# Patient Record
Sex: Female | Born: 1955 | Race: Black or African American | Hispanic: No | Marital: Single | Smoking: Former smoker
Health system: Southern US, Community
[De-identification: ages and names within clinical notes are randomized; demographics above are authoritative.]

## PROBLEM LIST (undated history)

## (undated) DIAGNOSIS — J45909 Unspecified asthma, uncomplicated: Secondary | ICD-10-CM

## (undated) DIAGNOSIS — K8020 Calculus of gallbladder without cholecystitis without obstruction: Secondary | ICD-10-CM

## (undated) DIAGNOSIS — E611 Iron deficiency: Secondary | ICD-10-CM

## (undated) DIAGNOSIS — G43809 Other migraine, not intractable, without status migrainosus: Secondary | ICD-10-CM

## (undated) DIAGNOSIS — J309 Allergic rhinitis, unspecified: Secondary | ICD-10-CM

## (undated) DIAGNOSIS — F329 Major depressive disorder, single episode, unspecified: Secondary | ICD-10-CM

## (undated) DIAGNOSIS — K635 Polyp of colon: Secondary | ICD-10-CM

## (undated) DIAGNOSIS — I10 Essential (primary) hypertension: Secondary | ICD-10-CM

## (undated) DIAGNOSIS — F419 Anxiety disorder, unspecified: Secondary | ICD-10-CM

## (undated) DIAGNOSIS — K219 Gastro-esophageal reflux disease without esophagitis: Secondary | ICD-10-CM

## (undated) DIAGNOSIS — E785 Hyperlipidemia, unspecified: Secondary | ICD-10-CM

## (undated) DIAGNOSIS — G4700 Insomnia, unspecified: Secondary | ICD-10-CM

## (undated) DIAGNOSIS — J449 Chronic obstructive pulmonary disease, unspecified: Secondary | ICD-10-CM

## (undated) DIAGNOSIS — D649 Anemia, unspecified: Secondary | ICD-10-CM

## (undated) DIAGNOSIS — F17200 Nicotine dependence, unspecified, uncomplicated: Secondary | ICD-10-CM

## (undated) DIAGNOSIS — J4489 Other specified chronic obstructive pulmonary disease: Secondary | ICD-10-CM

## (undated) DIAGNOSIS — E538 Deficiency of other specified B group vitamins: Secondary | ICD-10-CM

## (undated) DIAGNOSIS — E876 Hypokalemia: Secondary | ICD-10-CM

## (undated) DIAGNOSIS — E7800 Pure hypercholesterolemia, unspecified: Secondary | ICD-10-CM

## (undated) DIAGNOSIS — F3289 Other specified depressive episodes: Secondary | ICD-10-CM

## (undated) DIAGNOSIS — R0789 Other chest pain: Secondary | ICD-10-CM

## (undated) DIAGNOSIS — M129 Arthropathy, unspecified: Secondary | ICD-10-CM

## (undated) HISTORY — DX: Unspecified asthma, uncomplicated: J45.909

## (undated) HISTORY — DX: Other chest pain: R07.89

## (undated) HISTORY — DX: Essential (primary) hypertension: I10

## (undated) HISTORY — DX: Nicotine dependence, unspecified, uncomplicated: F17.200

## (undated) HISTORY — PX: KNEE ARTHROSCOPY: SUR90

## (undated) HISTORY — DX: Arthropathy, unspecified: M12.9

## (undated) HISTORY — DX: Chronic obstructive pulmonary disease, unspecified: J44.9

## (undated) HISTORY — DX: Anxiety disorder, unspecified: F41.9

## (undated) HISTORY — PX: TONSILLECTOMY: SUR1361

## (undated) HISTORY — PX: CARPAL TUNNEL RELEASE: SHX101

## (undated) HISTORY — DX: Calculus of gallbladder without cholecystitis without obstruction: K80.20

## (undated) HISTORY — DX: Gastro-esophageal reflux disease without esophagitis: K21.9

## (undated) HISTORY — DX: Deficiency of other specified B group vitamins: E53.8

## (undated) HISTORY — PX: APPENDECTOMY: SHX54

## (undated) HISTORY — DX: Other migraine, not intractable, without status migrainosus: G43.809

## (undated) HISTORY — DX: Allergic rhinitis, unspecified: J30.9

## (undated) HISTORY — DX: Anemia, unspecified: D64.9

## (undated) HISTORY — DX: Polyp of colon: K63.5

## (undated) HISTORY — DX: Other specified chronic obstructive pulmonary disease: J44.89

## (undated) HISTORY — DX: Hyperlipidemia, unspecified: E78.5

## (undated) HISTORY — DX: Morbid (severe) obesity due to excess calories: E66.01

## (undated) HISTORY — DX: Iron deficiency: E61.1

## (undated) HISTORY — DX: Other specified depressive episodes: F32.89

## (undated) HISTORY — DX: Major depressive disorder, single episode, unspecified: F32.9

## (undated) HISTORY — DX: Pure hypercholesterolemia, unspecified: E78.00

## (undated) HISTORY — DX: Insomnia, unspecified: G47.00

## (undated) HISTORY — DX: Hypokalemia: E87.6

---

## 1989-07-10 HISTORY — PX: CHOLECYSTECTOMY: SHX55

## 2001-10-11 ENCOUNTER — Inpatient Hospital Stay (HOSPITAL_COMMUNITY): Admission: EM | Admit: 2001-10-11 | Discharge: 2001-10-21 | Payer: Self-pay | Admitting: Psychiatry

## 2001-11-23 ENCOUNTER — Emergency Department (HOSPITAL_COMMUNITY): Admission: EM | Admit: 2001-11-23 | Discharge: 2001-11-23 | Payer: Self-pay | Admitting: Emergency Medicine

## 2007-10-25 IMAGING — CR DG KNEE COMPLETE 4+V*L*
4 series · 4 of 4 positions shown · non-contrast
Comparison: None

CLINICAL DATA: Left knee pain

LEFT KNEE - COMPLETE 4+ VIEW

[t knee ap left]
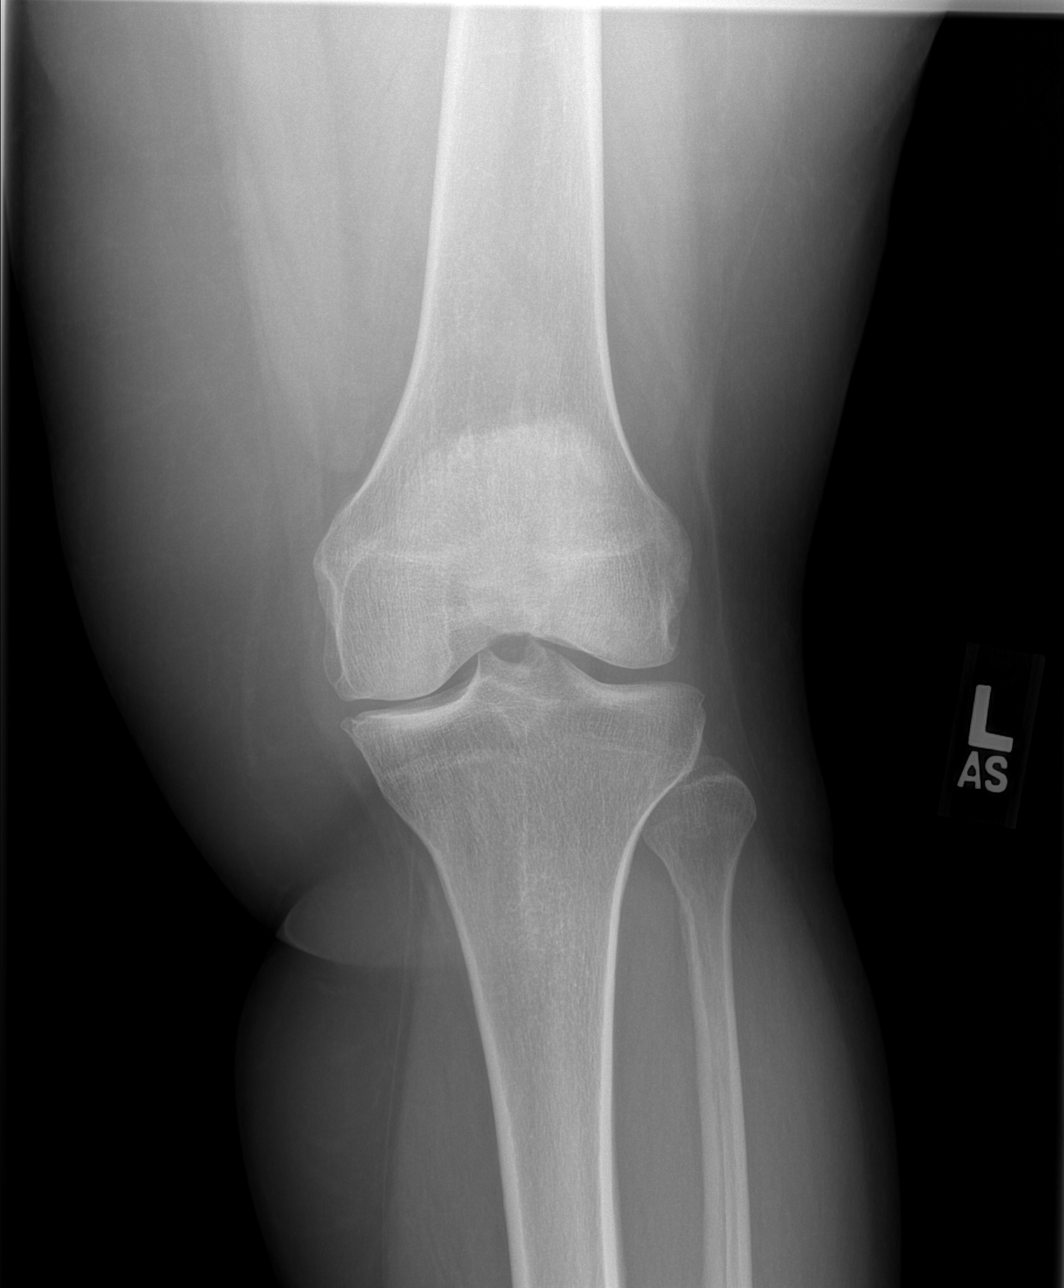

[t knee oblique left (1 of 2)]
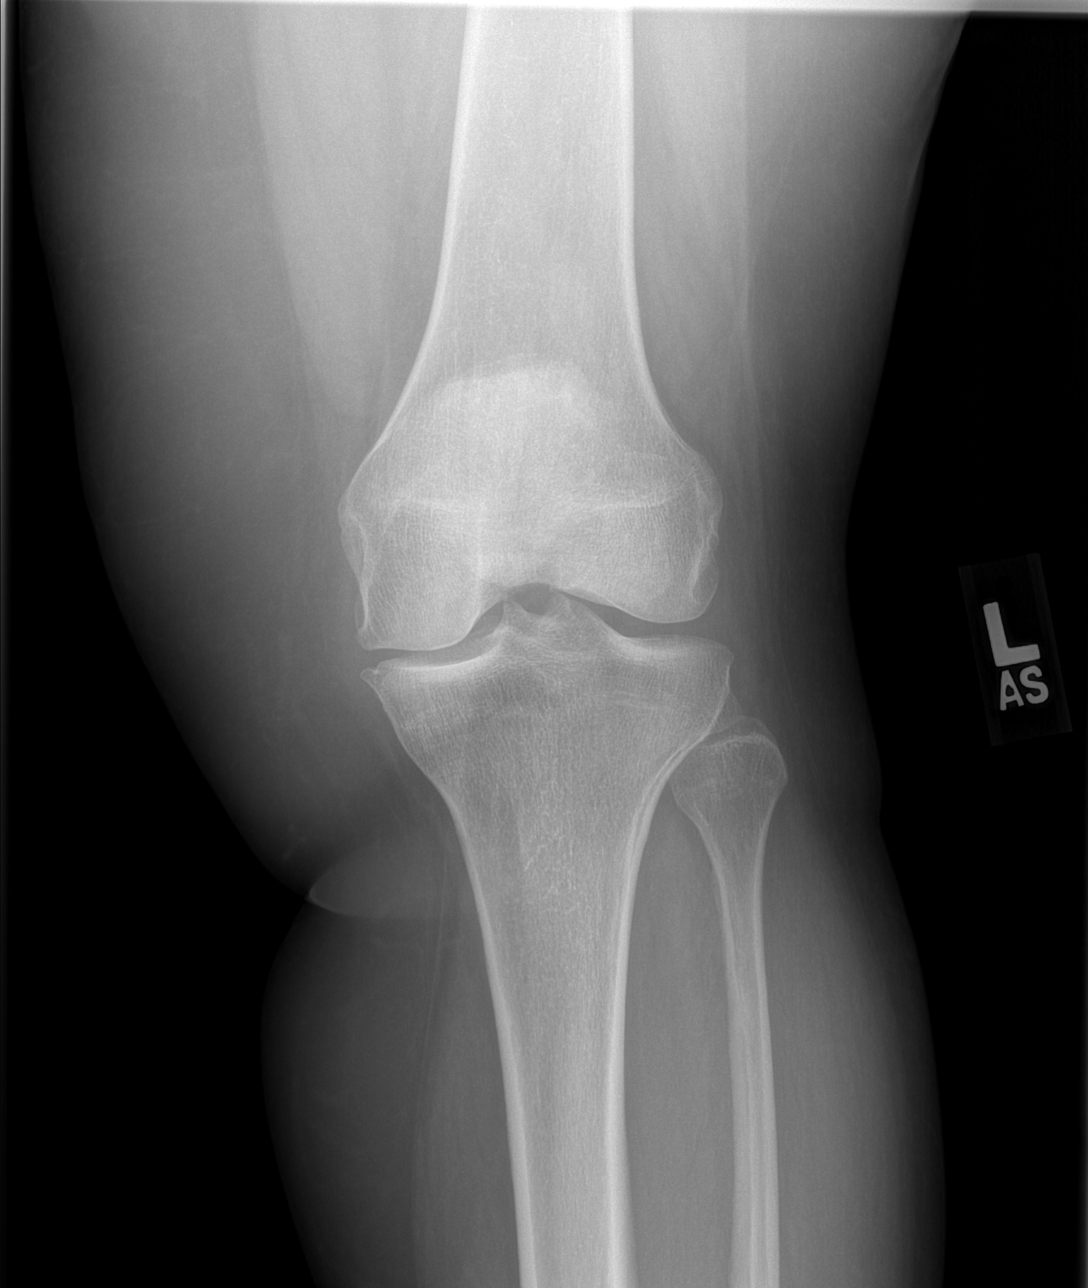

[t knee oblique left (2 of 2)]
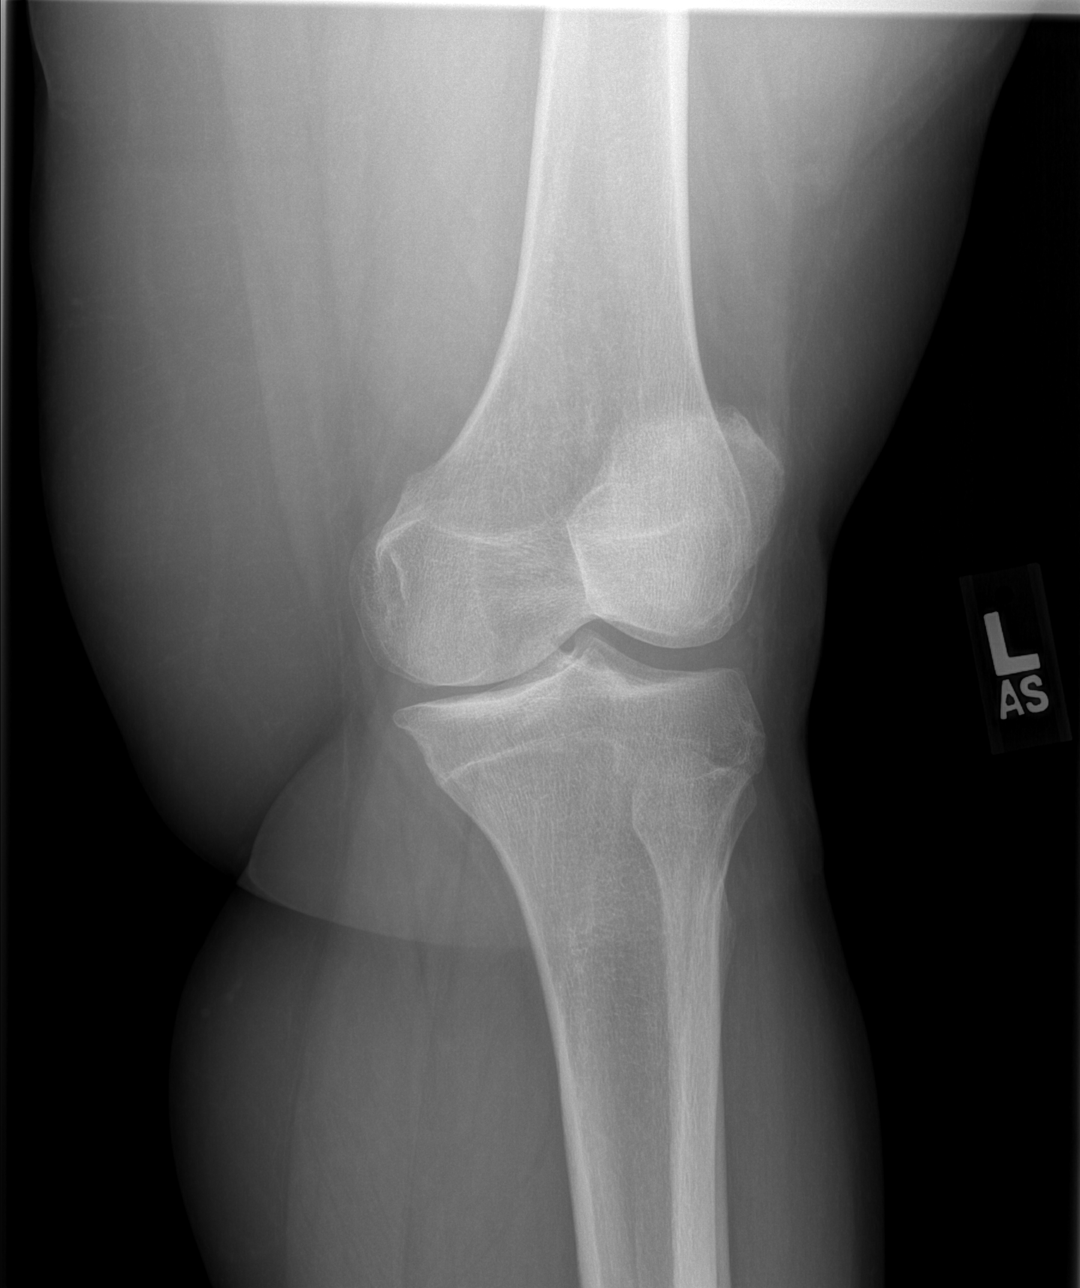

[t knee lat left]
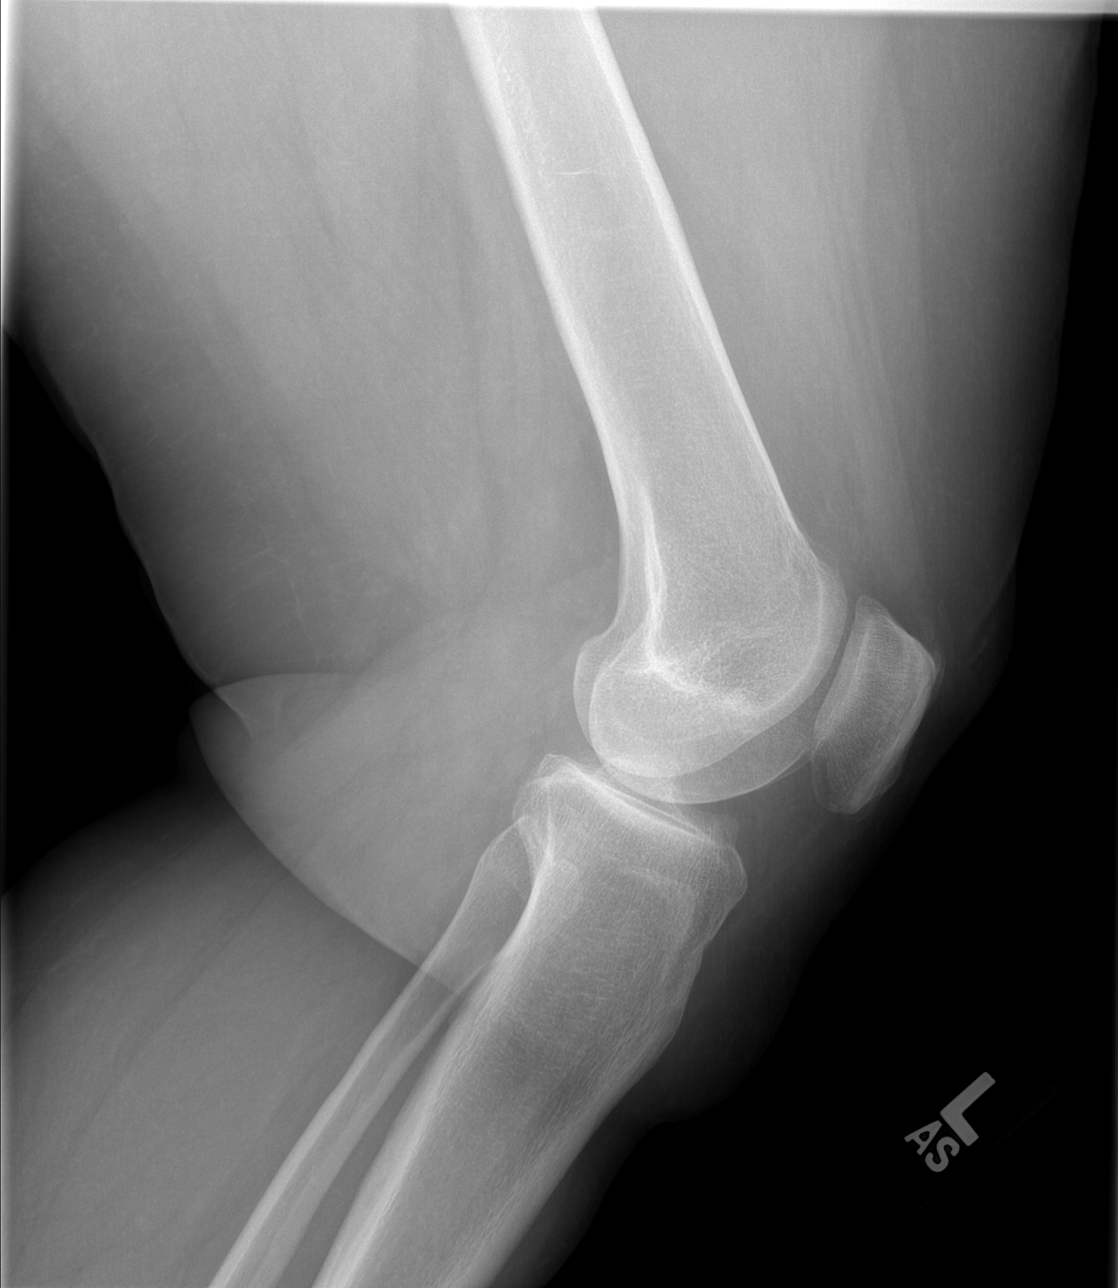

[4 of 4 positions shown; findings below may reference images not displayed]

FINDINGS: No acute bony abnormality.  Specifically, no fracture,
subluxation, or dislocation.  Soft tissues are intact. No joint
effusion.  Mild degenerative changes with joint space narrowing in
the medial compartment.
IMPRESSION: No acute bony abnormality.

Mild degenerative changes.

## 2008-04-06 ENCOUNTER — Encounter: Admission: RE | Admit: 2008-04-06 | Discharge: 2008-04-06 | Payer: Self-pay | Admitting: Family Medicine

## 2008-05-27 ENCOUNTER — Emergency Department (HOSPITAL_COMMUNITY): Admission: EM | Admit: 2008-05-27 | Discharge: 2008-05-27 | Payer: Self-pay | Admitting: Emergency Medicine

## 2008-05-27 IMAGING — CR DG CHEST 2V
2 series · 2 of 2 positions shown · non-contrast
Comparison: None

CLINICAL DATA: Cough.  Congestion.  Smoker.

CHEST - 2 VIEW

[w chest pa]
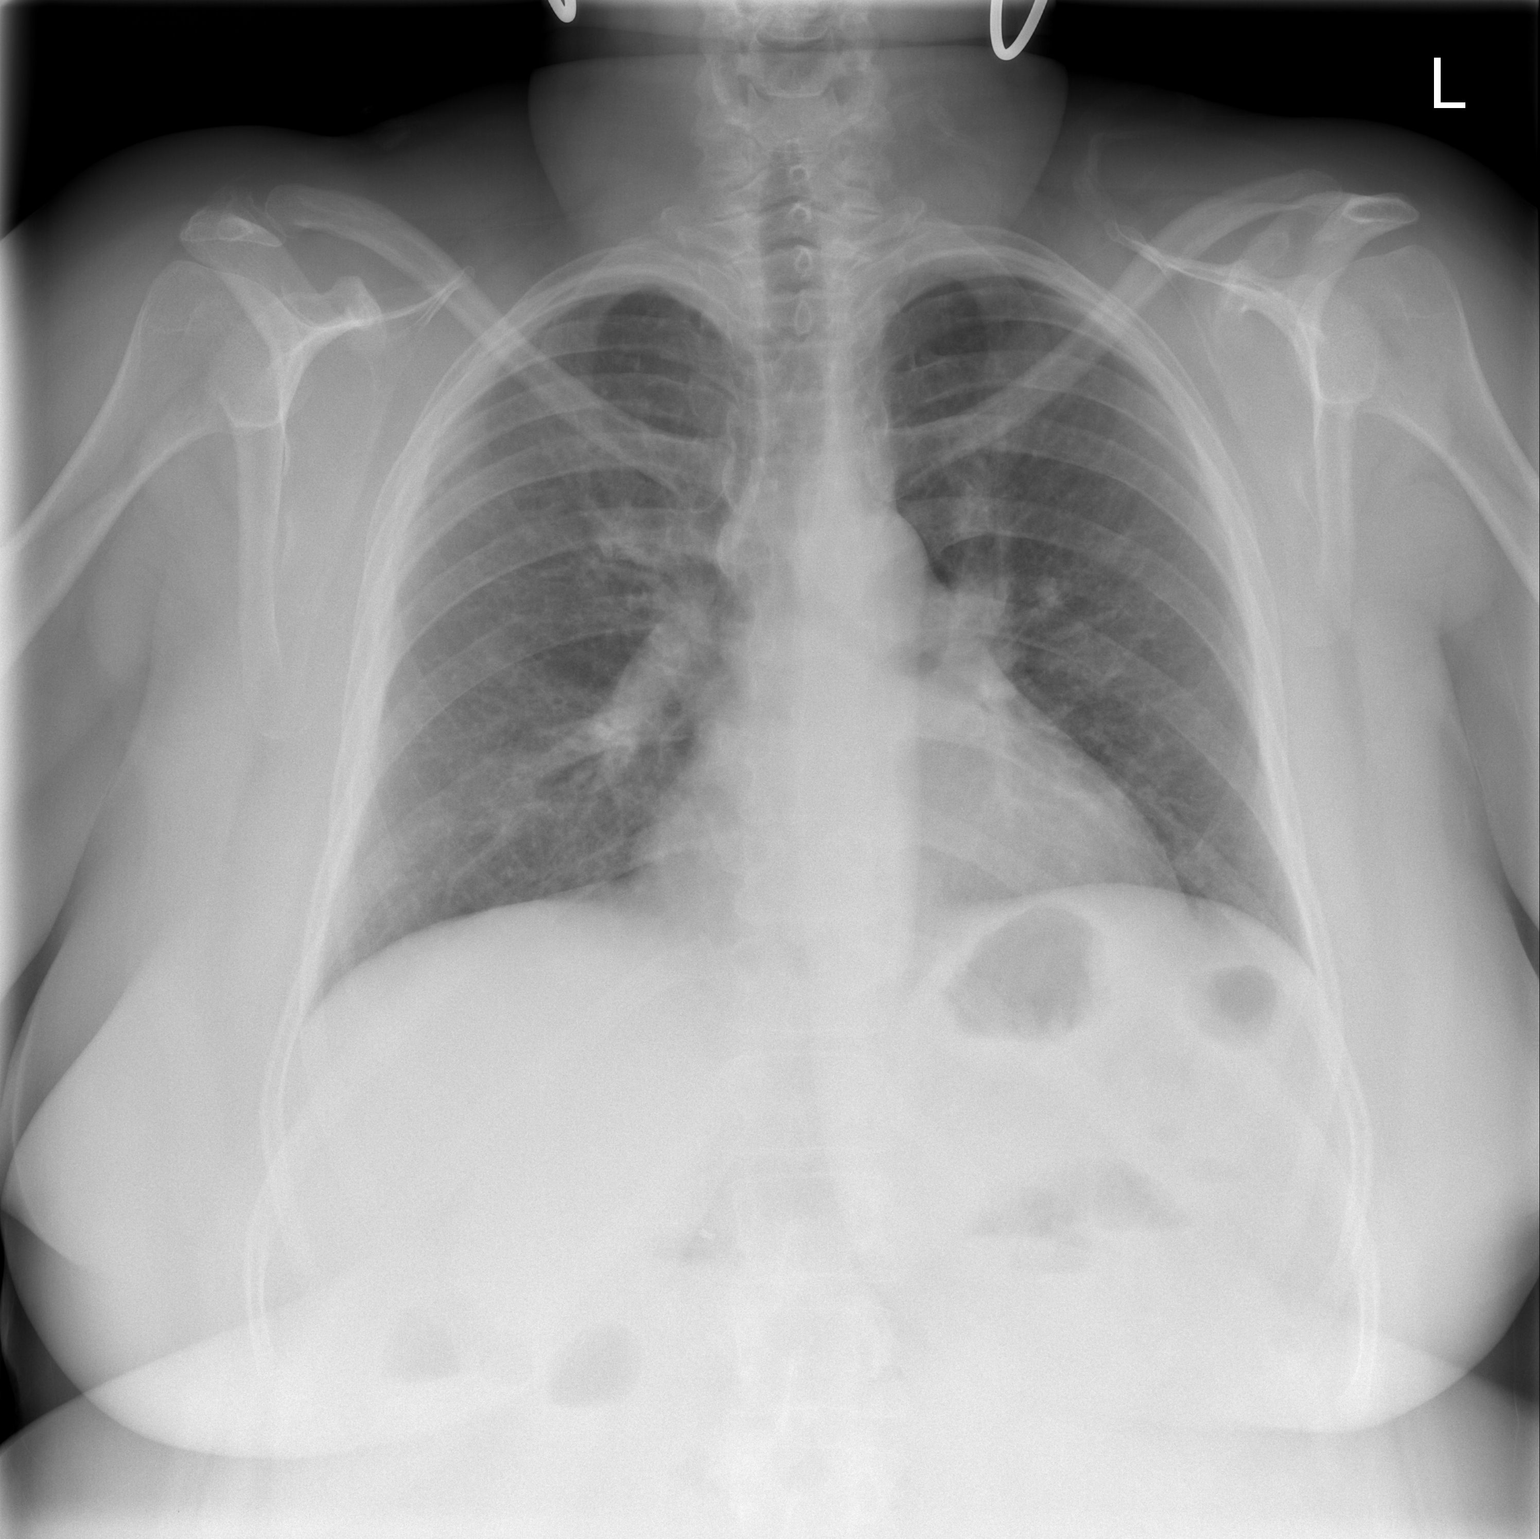

[w chest lat]
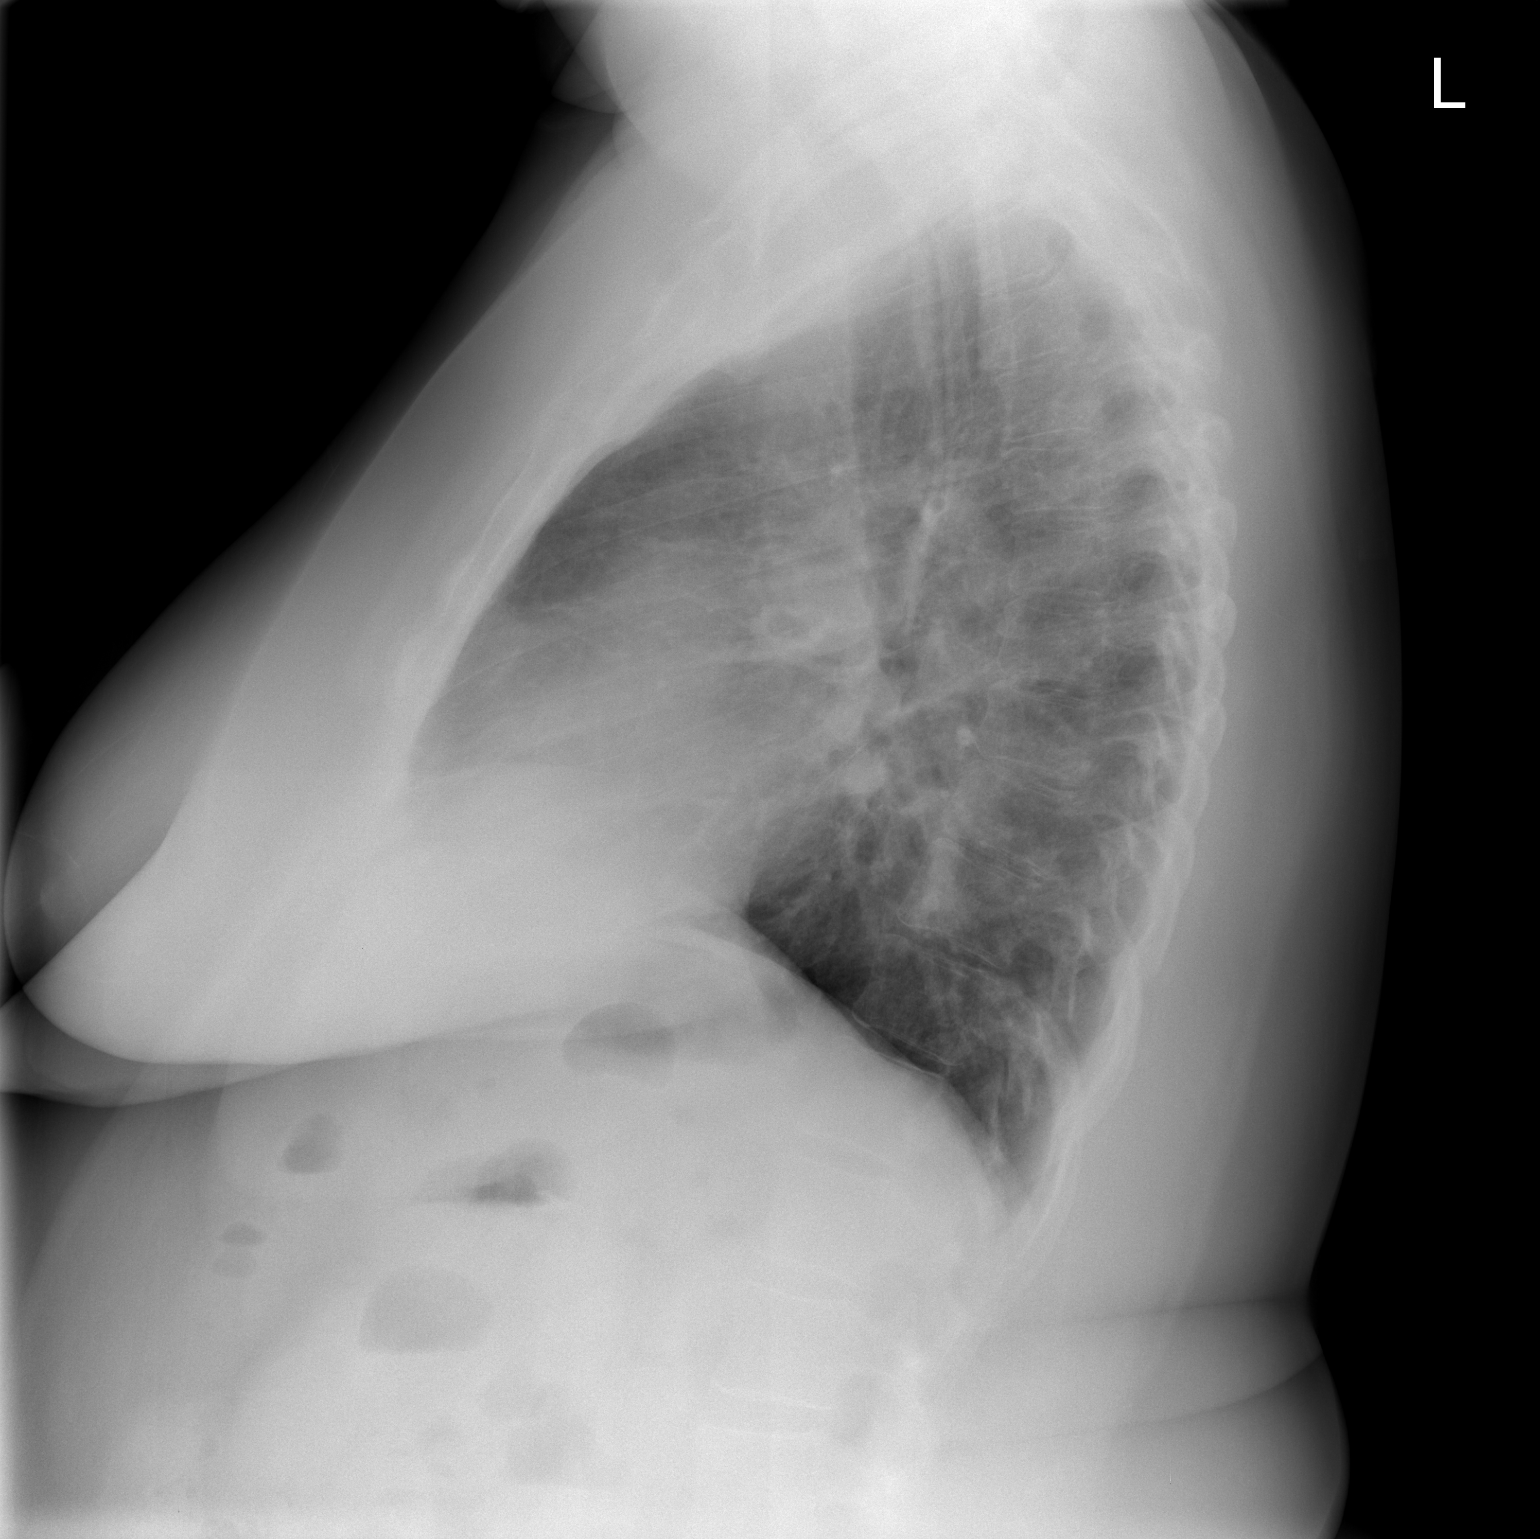

[2 of 2 positions shown; findings below may reference images not displayed]

FINDINGS: Chronic bronchitic changes.  No active infiltrate,
consolidation, or atelectasis.  Normal cardiomediastinal
silhouette.
IMPRESSION: Negative for pneumonia.  Chronic bronchitic changes

## 2008-06-01 ENCOUNTER — Encounter (INDEPENDENT_AMBULATORY_CARE_PROVIDER_SITE_OTHER): Payer: Self-pay | Admitting: *Deleted

## 2008-06-01 ENCOUNTER — Ambulatory Visit: Payer: Self-pay | Admitting: Internal Medicine

## 2008-06-01 ENCOUNTER — Telehealth: Payer: Self-pay | Admitting: Internal Medicine

## 2008-06-01 DIAGNOSIS — J449 Chronic obstructive pulmonary disease, unspecified: Secondary | ICD-10-CM

## 2008-06-01 DIAGNOSIS — K219 Gastro-esophageal reflux disease without esophagitis: Secondary | ICD-10-CM

## 2008-06-01 DIAGNOSIS — J4489 Other specified chronic obstructive pulmonary disease: Secondary | ICD-10-CM | POA: Insufficient documentation

## 2008-06-01 DIAGNOSIS — R519 Headache, unspecified: Secondary | ICD-10-CM | POA: Insufficient documentation

## 2008-06-01 DIAGNOSIS — F17200 Nicotine dependence, unspecified, uncomplicated: Secondary | ICD-10-CM

## 2008-06-01 DIAGNOSIS — E785 Hyperlipidemia, unspecified: Secondary | ICD-10-CM | POA: Insufficient documentation

## 2008-06-01 DIAGNOSIS — F329 Major depressive disorder, single episode, unspecified: Secondary | ICD-10-CM

## 2008-06-01 DIAGNOSIS — R51 Headache: Secondary | ICD-10-CM

## 2008-06-01 DIAGNOSIS — G43809 Other migraine, not intractable, without status migrainosus: Secondary | ICD-10-CM | POA: Insufficient documentation

## 2008-06-01 LAB — CONVERTED CEMR LAB
Alkaline Phosphatase: 84 units/L (ref 39–117)
BUN: 9 mg/dL (ref 6–23)
Basophils Relative: 1.3 % (ref 0.0–3.0)
Bilirubin Urine: NEGATIVE
CO2: 32 meq/L (ref 19–32)
Chloride: 102 meq/L (ref 96–112)
Cholesterol: 203 mg/dL (ref 0–200)
Eosinophils Absolute: 0 10*3/uL (ref 0.0–0.7)
GFR calc Af Amer: 98 mL/min
HDL: 76.6 mg/dL (ref >39.0)
Hemoglobin, Urine: NEGATIVE
Ketones, ur: NEGATIVE mg/dL
MCHC: 34.5 g/dL (ref 30.0–36.0)
Mucus, UA: NEGATIVE
Neutro Abs: 4.5 10*3/uL (ref 1.4–7.7)
Neutrophils Relative %: 46.9 % (ref 43.0–77.0)
Nitrite: NEGATIVE
Platelets: 263 10*3/uL (ref 150–400)
Potassium: 3.5 meq/L (ref 3.5–5.1)
RBC: 4.22 M/uL (ref 3.87–5.11)
Sodium: 141 meq/L (ref 135–145)
Total Bilirubin: 0.4 mg/dL (ref 0.3–1.2)
Total CHOL/HDL Ratio: 2.7
Urine Glucose: NEGATIVE mg/dL
Urobilinogen, UA: 0.2 (ref 0.0–1.0)
pH: 6.5 (ref 5.0–8.0)

## 2008-06-02 ENCOUNTER — Encounter: Payer: Self-pay | Admitting: Internal Medicine

## 2008-06-03 ENCOUNTER — Telehealth: Payer: Self-pay | Admitting: Internal Medicine

## 2008-06-16 ENCOUNTER — Ambulatory Visit: Payer: Self-pay | Admitting: Internal Medicine

## 2008-06-16 ENCOUNTER — Telehealth (INDEPENDENT_AMBULATORY_CARE_PROVIDER_SITE_OTHER): Payer: Self-pay | Admitting: *Deleted

## 2008-06-16 LAB — CONVERTED CEMR LAB: HDL goal, serum: 40 mg/dL

## 2008-06-17 ENCOUNTER — Telehealth: Payer: Self-pay | Admitting: Internal Medicine

## 2008-06-23 ENCOUNTER — Ambulatory Visit: Payer: Self-pay | Admitting: Gastroenterology

## 2008-06-23 DIAGNOSIS — M1710 Unilateral primary osteoarthritis, unspecified knee: Secondary | ICD-10-CM

## 2008-06-23 LAB — CONVERTED CEMR LAB: Tissue Transglutaminase Ab, IgA: 0.5 units (ref <7)

## 2008-06-24 LAB — CONVERTED CEMR LAB
Ferritin: 48 ng/mL (ref 10.0–291.0)
Iron: 59 ug/dL (ref 42–145)

## 2008-06-25 ENCOUNTER — Ambulatory Visit: Payer: Self-pay | Admitting: Internal Medicine

## 2008-06-25 ENCOUNTER — Telehealth: Payer: Self-pay | Admitting: Internal Medicine

## 2008-06-25 DIAGNOSIS — D519 Vitamin B12 deficiency anemia, unspecified: Secondary | ICD-10-CM

## 2008-06-29 ENCOUNTER — Ambulatory Visit: Payer: Self-pay | Admitting: Internal Medicine

## 2008-07-06 ENCOUNTER — Ambulatory Visit: Payer: Self-pay | Admitting: Internal Medicine

## 2008-07-08 ENCOUNTER — Encounter: Payer: Self-pay | Admitting: Internal Medicine

## 2008-08-17 ENCOUNTER — Ambulatory Visit: Payer: Self-pay | Admitting: Internal Medicine

## 2008-08-17 DIAGNOSIS — J309 Allergic rhinitis, unspecified: Secondary | ICD-10-CM | POA: Insufficient documentation

## 2008-08-25 ENCOUNTER — Telehealth: Payer: Self-pay | Admitting: Gastroenterology

## 2009-03-02 ENCOUNTER — Ambulatory Visit: Payer: Self-pay | Admitting: Internal Medicine

## 2009-03-02 LAB — CONVERTED CEMR LAB
AST: 17 units/L (ref 0–37)
Albumin: 3.6 g/dL (ref 3.5–5.2)
Alkaline Phosphatase: 96 units/L (ref 39–117)
Basophils Relative: 0.6 % (ref 0.0–3.0)
Creatinine, Ser: 0.7 mg/dL (ref 0.4–1.2)
Eosinophils Absolute: 0.1 10*3/uL (ref 0.0–0.7)
Eosinophils Relative: 1.2 % (ref 0.0–5.0)
Folate: 6.8 ng/mL
Glucose, Bld: 110 mg/dL — ABNORMAL HIGH (ref 70–99)
HCT: 38.4 % (ref 36.0–46.0)
Hemoglobin: 12.9 g/dL (ref 12.0–15.0)
Lymphocytes Relative: 33.4 % (ref 12.0–46.0)
Lymphs Abs: 2.1 10*3/uL (ref 0.7–4.0)
Monocytes Relative: 8.1 % (ref 3.0–12.0)
Pro B Natriuretic peptide (BNP): 23 pg/mL (ref 0.0–100.0)
RBC: 4.4 M/uL (ref 3.87–5.11)
TSH: 1.34 microintl units/mL (ref 0.35–5.50)
Total Bilirubin: 0.4 mg/dL (ref 0.3–1.2)
Total Protein: 7.8 g/dL (ref 6.0–8.3)

## 2009-03-03 ENCOUNTER — Encounter: Payer: Self-pay | Admitting: Internal Medicine

## 2009-03-03 DIAGNOSIS — J4530 Mild persistent asthma, uncomplicated: Secondary | ICD-10-CM

## 2009-03-16 ENCOUNTER — Telehealth (INDEPENDENT_AMBULATORY_CARE_PROVIDER_SITE_OTHER): Payer: Self-pay | Admitting: *Deleted

## 2009-03-16 ENCOUNTER — Ambulatory Visit: Payer: Self-pay | Admitting: Internal Medicine

## 2009-03-17 ENCOUNTER — Encounter: Payer: Self-pay | Admitting: Internal Medicine

## 2009-03-17 ENCOUNTER — Ambulatory Visit: Payer: Self-pay

## 2009-04-19 ENCOUNTER — Ambulatory Visit: Payer: Self-pay | Admitting: Internal Medicine

## 2009-05-11 ENCOUNTER — Ambulatory Visit: Payer: Self-pay | Admitting: Internal Medicine

## 2009-06-10 ENCOUNTER — Ambulatory Visit: Payer: Self-pay | Admitting: Internal Medicine

## 2009-06-21 ENCOUNTER — Emergency Department (HOSPITAL_COMMUNITY): Admission: EM | Admit: 2009-06-21 | Discharge: 2009-06-21 | Payer: Self-pay | Admitting: Emergency Medicine

## 2009-07-15 ENCOUNTER — Ambulatory Visit: Payer: Self-pay | Admitting: Internal Medicine

## 2009-08-16 ENCOUNTER — Ambulatory Visit: Payer: Self-pay | Admitting: Internal Medicine

## 2009-09-20 ENCOUNTER — Ambulatory Visit: Payer: Self-pay | Admitting: Internal Medicine

## 2009-09-20 DIAGNOSIS — G4700 Insomnia, unspecified: Secondary | ICD-10-CM

## 2009-10-20 ENCOUNTER — Other Ambulatory Visit: Admission: RE | Admit: 2009-10-20 | Discharge: 2009-10-20 | Payer: Self-pay | Admitting: Internal Medicine

## 2009-10-20 ENCOUNTER — Ambulatory Visit: Payer: Self-pay | Admitting: Internal Medicine

## 2009-10-20 LAB — CONVERTED CEMR LAB
AST: 17 units/L (ref 0–37)
Albumin: 3.7 g/dL (ref 3.5–5.2)
Basophils Relative: 0.5 % (ref 0.0–3.0)
Calcium: 9.3 mg/dL (ref 8.4–10.5)
Cholesterol: 244 mg/dL — ABNORMAL HIGH (ref 0–200)
Direct LDL: 154 mg/dL
Eosinophils Absolute: 0.1 10*3/uL (ref 0.0–0.7)
Eosinophils Relative: 0.8 % (ref 0.0–5.0)
GFR calc non Af Amer: 96.89 mL/min (ref >60)
Glucose, Bld: 105 mg/dL — ABNORMAL HIGH (ref 70–99)
HCT: 36.8 % (ref 36.0–46.0)
HDL: 72 mg/dL (ref >39.00)
MCHC: 33.6 g/dL (ref 30.0–36.0)
MCV: 86.9 fL (ref 78.0–100.0)
Neutrophils Relative %: 51.3 % (ref 43.0–77.0)
Platelets: 279 10*3/uL (ref 150.0–400.0)
Potassium: 4.2 meq/L (ref 3.5–5.1)
TSH: 1.84 microintl units/mL (ref 0.35–5.50)
Total CHOL/HDL Ratio: 3
WBC: 6.8 10*3/uL (ref 4.5–10.5)

## 2009-11-02 ENCOUNTER — Ambulatory Visit: Payer: Self-pay | Admitting: Internal Medicine

## 2009-11-02 IMAGING — CR DG CHEST 2V
2 series · 2 of 2 positions shown · non-contrast
Comparison: [DATE]

CLINICAL DATA: Cough for several months.  Smoker.

CHEST - 2 VIEW

[view not recorded (1 of 2)]
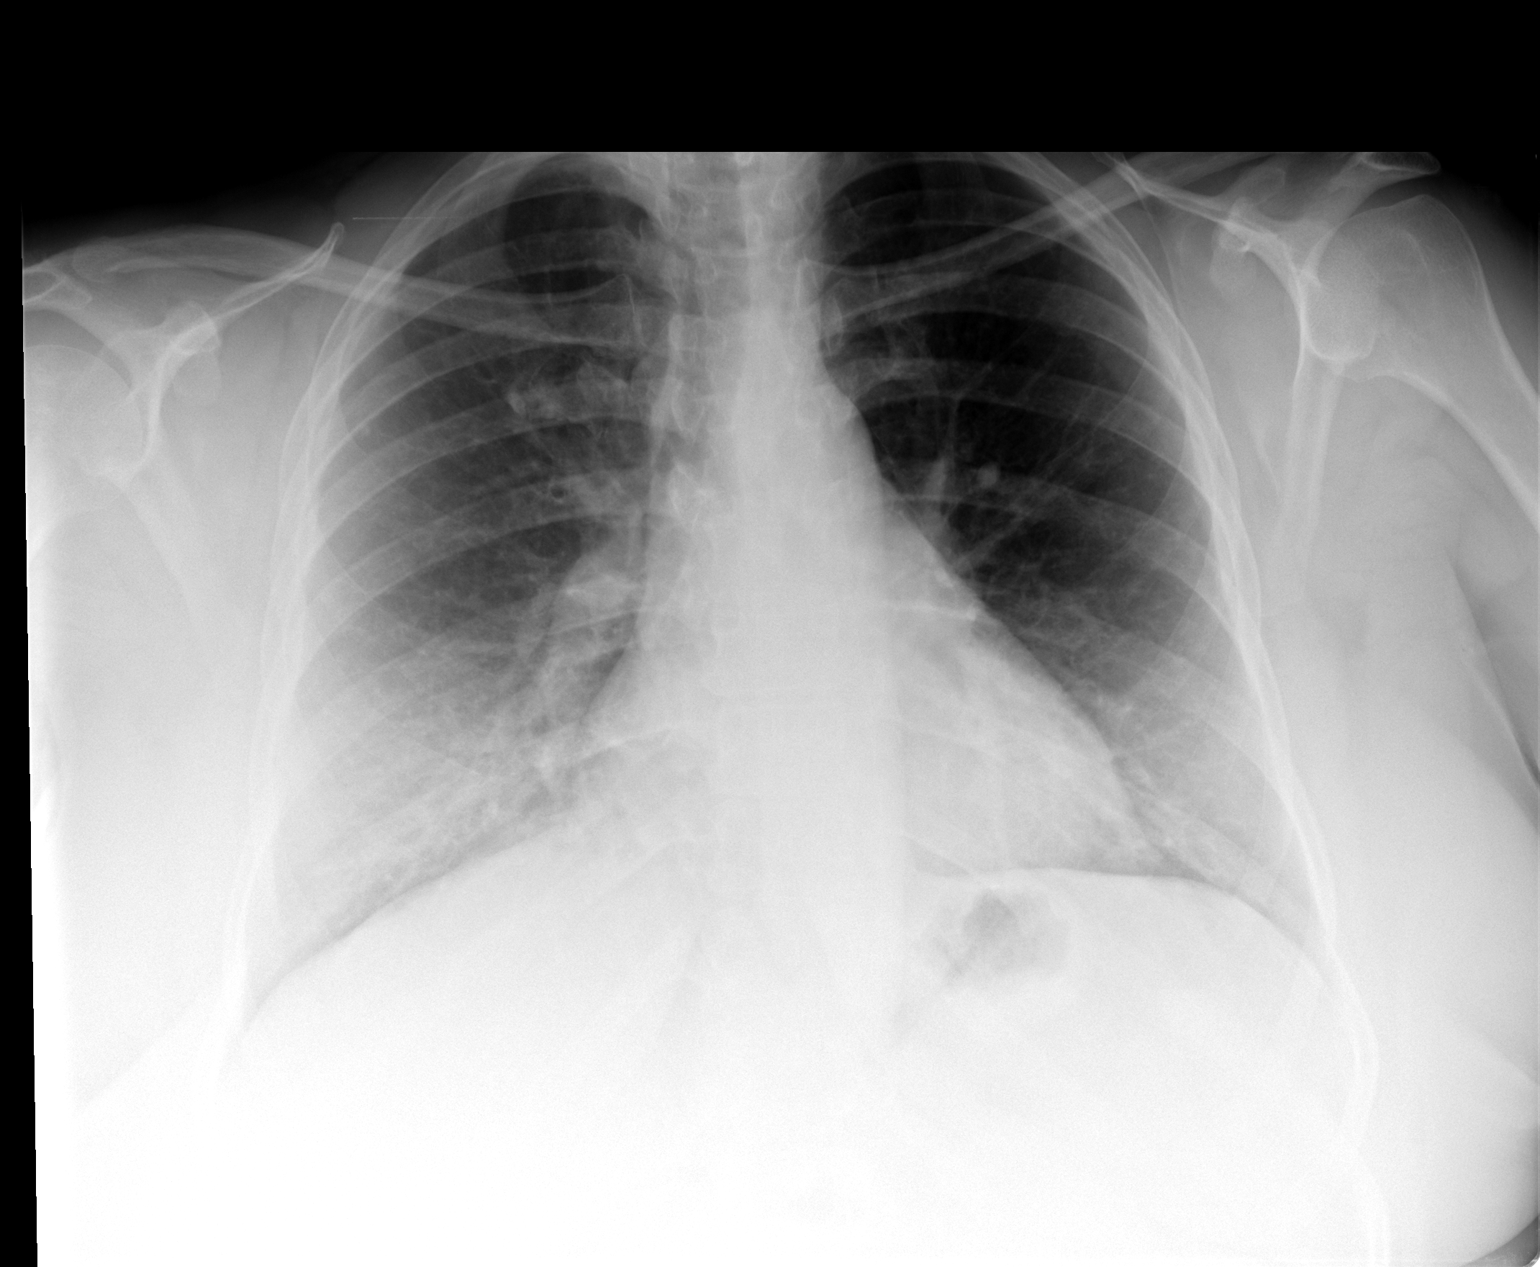

[view not recorded (2 of 2)]
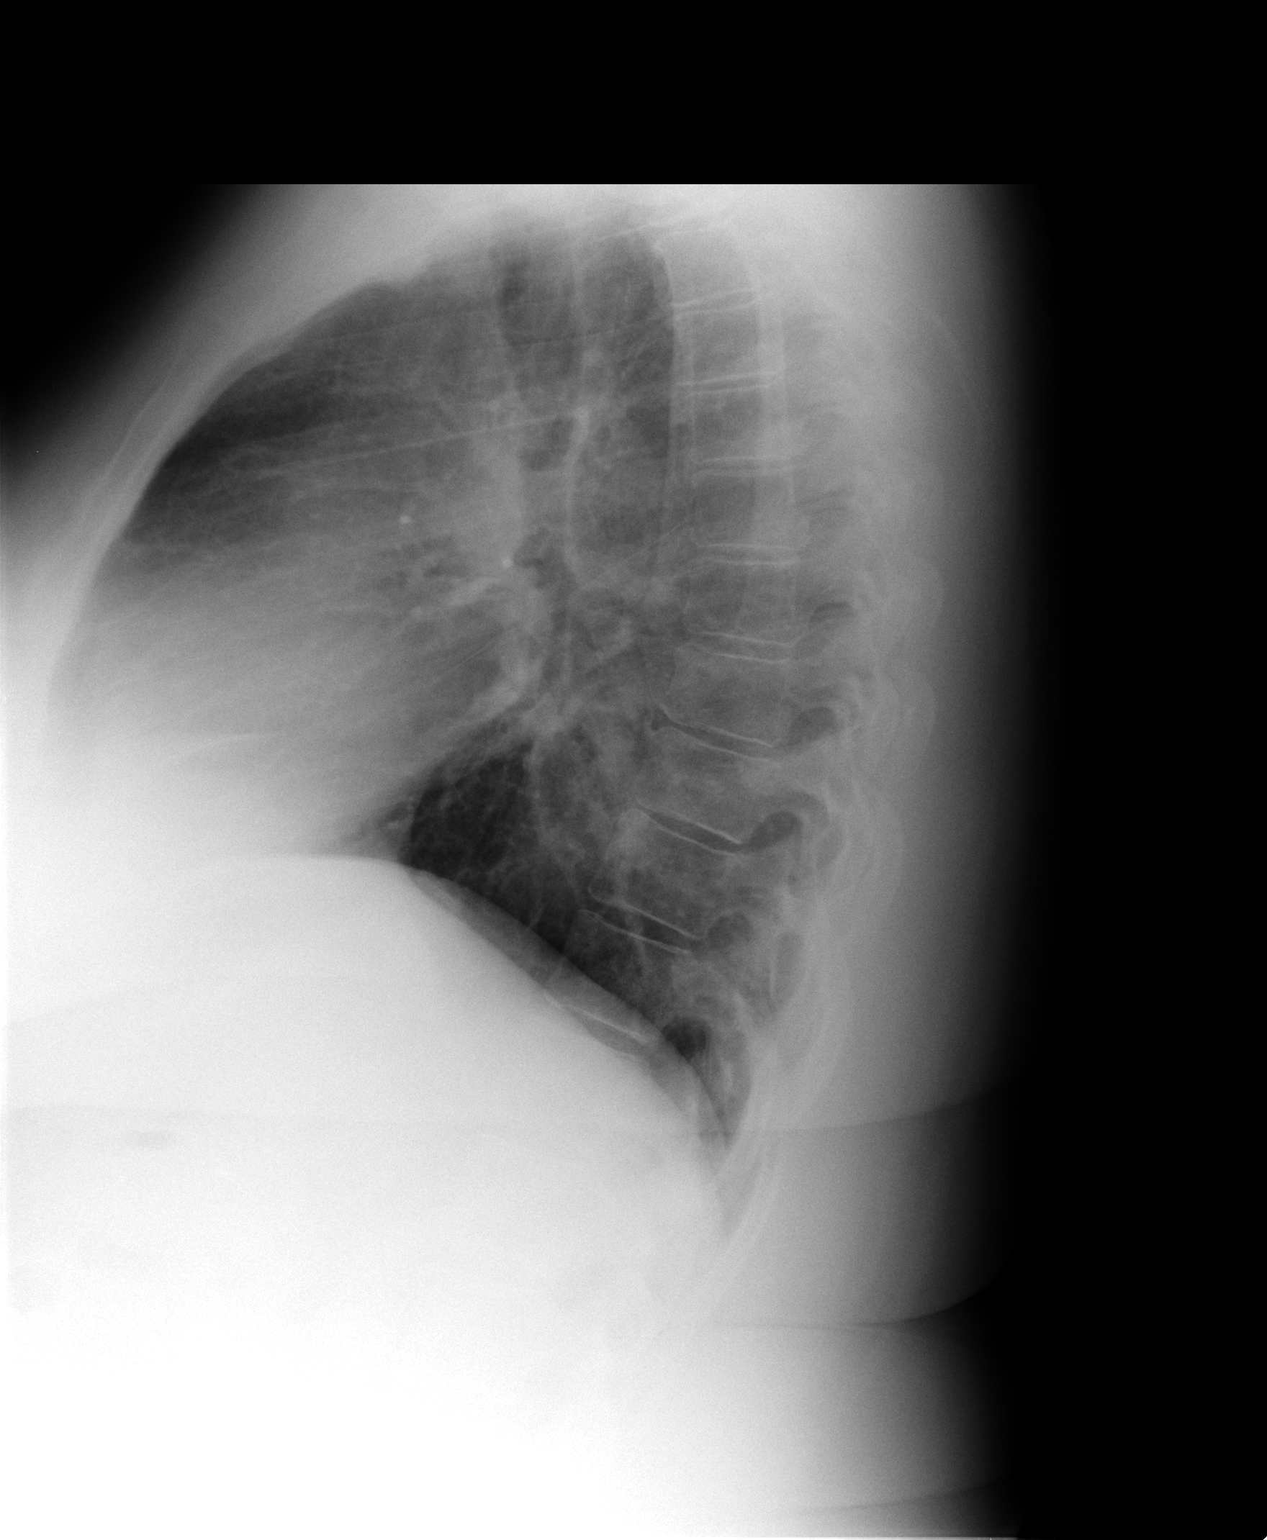

[2 of 2 positions shown; findings below may reference images not displayed]

FINDINGS: Midline trachea.  Normal heart size and mediastinal
contours. No pleural effusion or pneumothorax.  Mildly low lung
volumes with resultant pulmonary interstitial prominence. Clear
lungs.

Vague increased density over the right lung base is felt to be due
to overlying soft tissues on the frontal view.
IMPRESSION: No acute cardiopulmonary disease.

## 2009-11-04 ENCOUNTER — Encounter (INDEPENDENT_AMBULATORY_CARE_PROVIDER_SITE_OTHER): Payer: Self-pay | Admitting: *Deleted

## 2009-11-16 ENCOUNTER — Encounter: Admission: RE | Admit: 2009-11-16 | Discharge: 2009-11-16 | Payer: Self-pay | Admitting: Internal Medicine

## 2009-11-16 IMAGING — MG MM DIGITAL SCREENING
5 series · 5 of 5 positions shown · non-contrast
Comparison: none

DG SCREEN MAMMOGRAM BILATERAL
Bilateral CC and MLO view(s) were taken.

DIGITAL SCREENING MAMMOGRAM WITH CAD:
The breast tissue is almost entirely fatty.  No masses or malignant type calcifications are 
identified.
Images were processed with CAD.

[R CC]
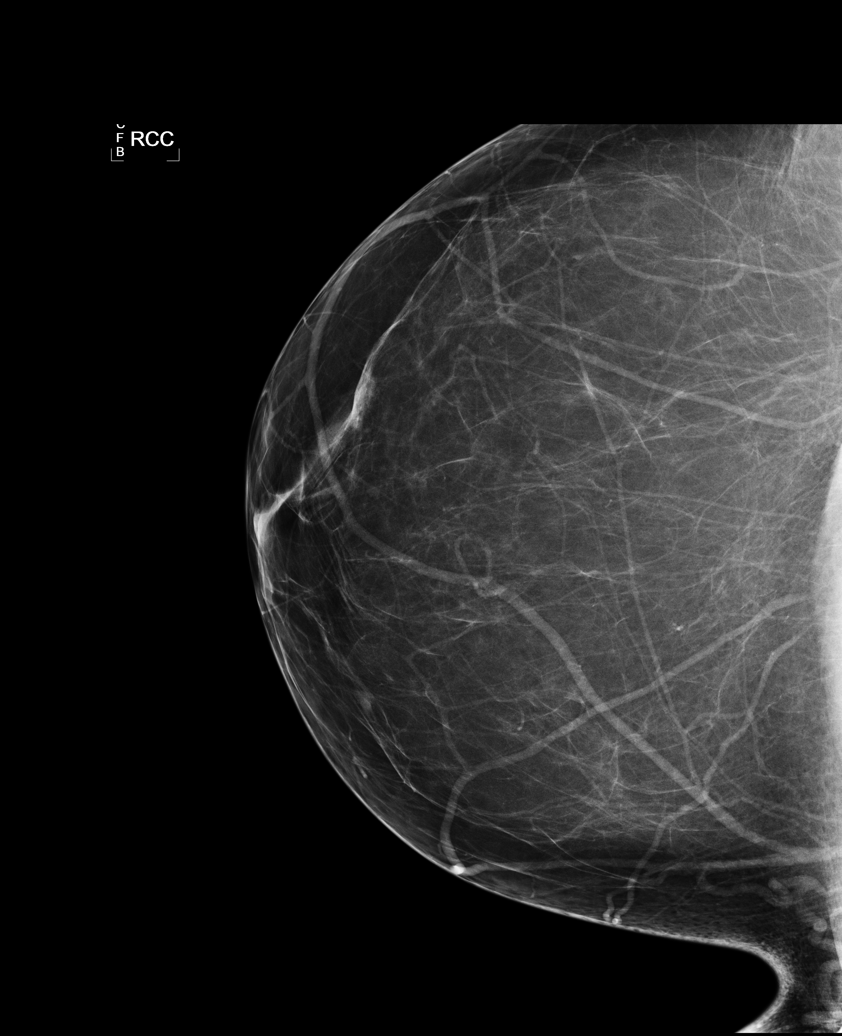

[L CC]
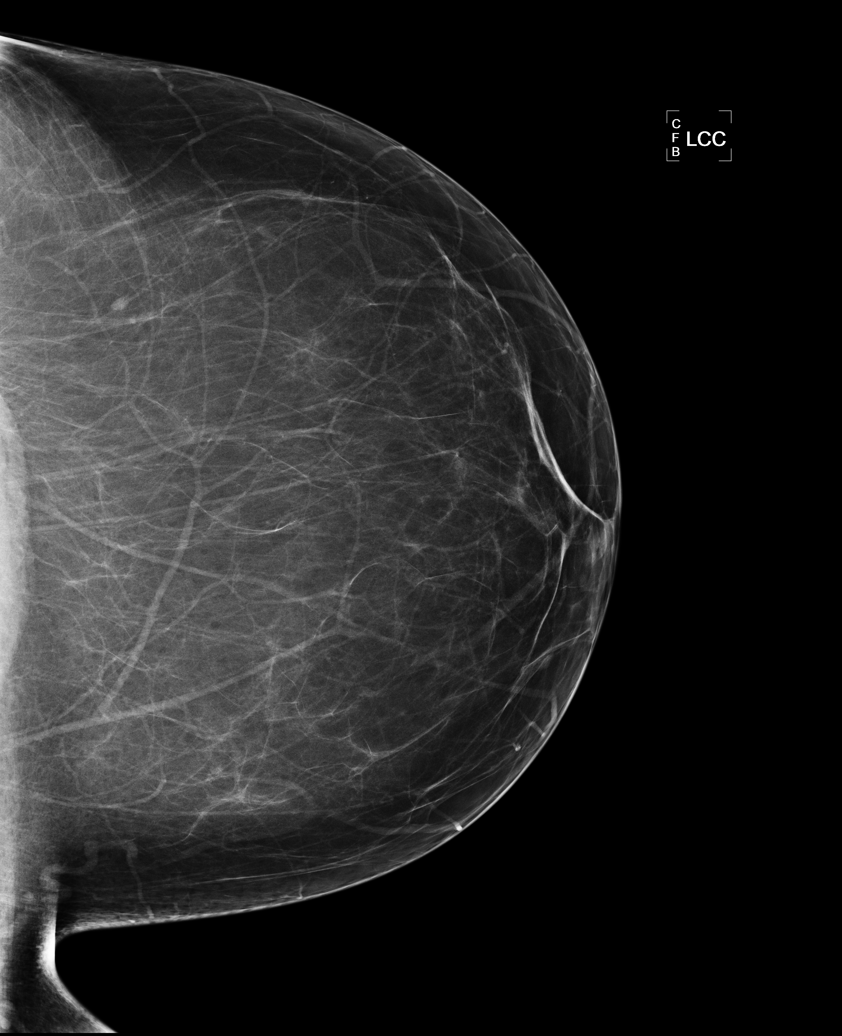

[L MLO]
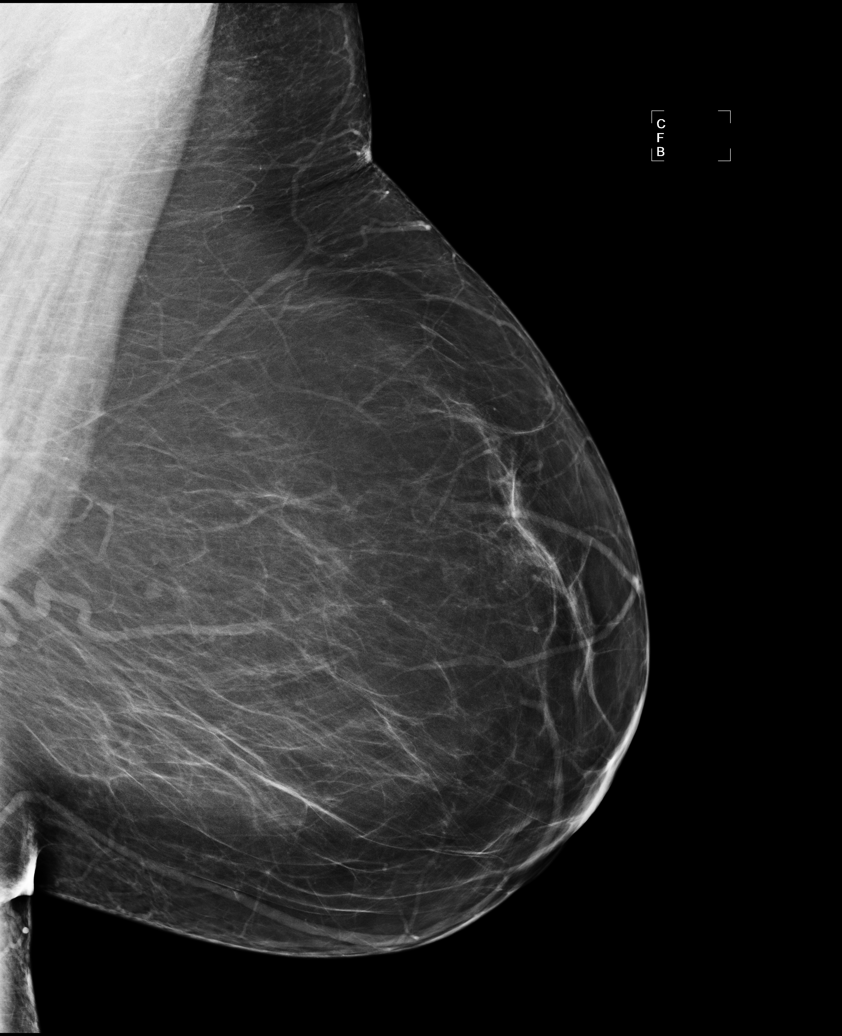

[R MLO (1 of 2)]
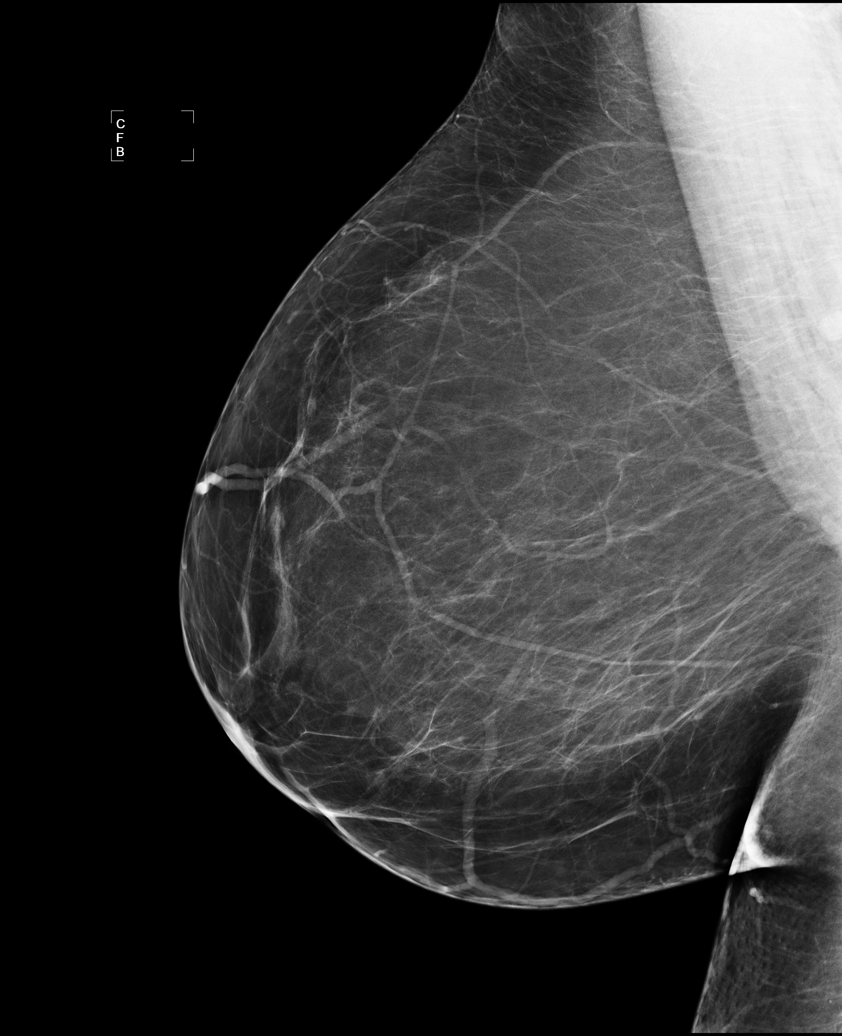

[R MLO (2 of 2)]
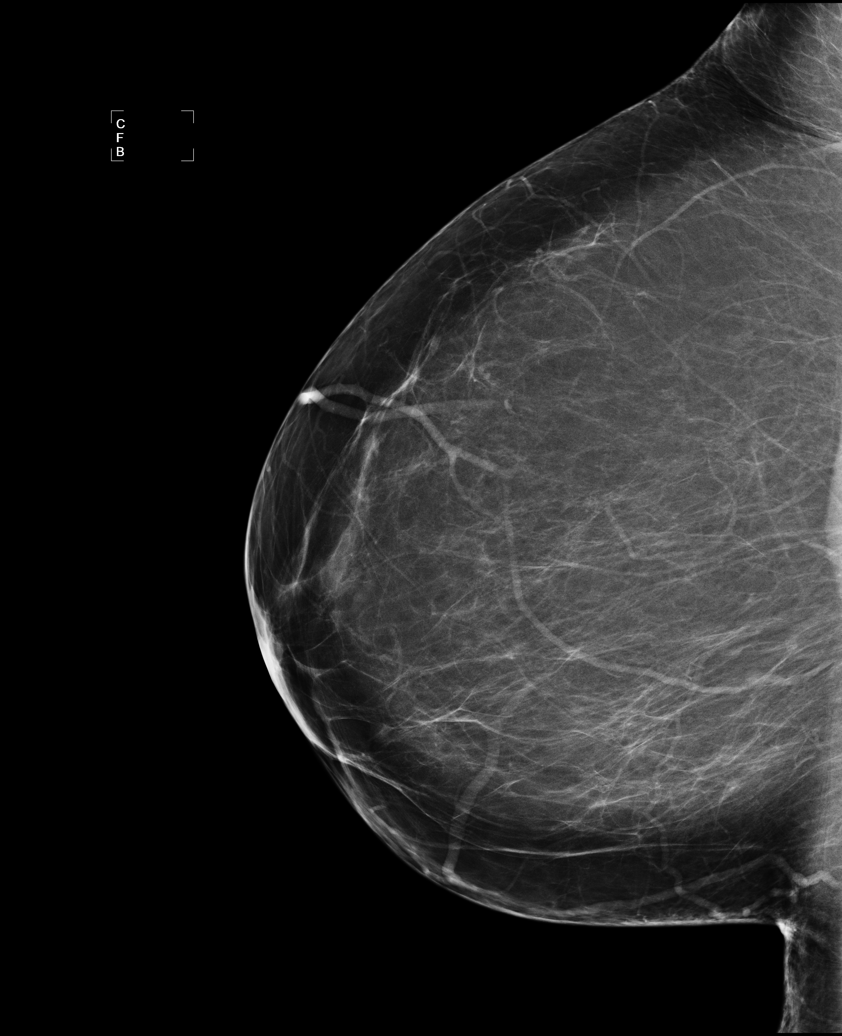

[5 of 5 positions shown; findings below may reference images not displayed]

IMPRESSION: No specific mammographic evidence of malignancy.  Next screening mammogram is recommended in one 
year.

A result letter of this screening mammogram will be mailed directly to the patient.

ASSESSMENT: Negative - BI-RADS 1

Screening mammogram in 1 year.
,

## 2009-11-18 ENCOUNTER — Ambulatory Visit: Payer: Self-pay | Admitting: Internal Medicine

## 2009-11-19 ENCOUNTER — Telehealth: Payer: Self-pay | Admitting: Internal Medicine

## 2009-11-19 ENCOUNTER — Encounter: Payer: Self-pay | Admitting: Internal Medicine

## 2009-11-23 ENCOUNTER — Encounter (INDEPENDENT_AMBULATORY_CARE_PROVIDER_SITE_OTHER): Payer: Self-pay | Admitting: *Deleted

## 2009-11-24 ENCOUNTER — Ambulatory Visit: Payer: Self-pay | Admitting: Internal Medicine

## 2010-01-07 ENCOUNTER — Encounter (INDEPENDENT_AMBULATORY_CARE_PROVIDER_SITE_OTHER): Payer: Self-pay | Admitting: *Deleted

## 2010-01-11 ENCOUNTER — Ambulatory Visit: Payer: Self-pay | Admitting: Gastroenterology

## 2010-01-19 ENCOUNTER — Ambulatory Visit: Payer: Self-pay | Admitting: Gastroenterology

## 2010-01-20 ENCOUNTER — Telehealth: Payer: Self-pay | Admitting: Gastroenterology

## 2010-01-21 ENCOUNTER — Encounter: Payer: Self-pay | Admitting: Gastroenterology

## 2010-01-27 ENCOUNTER — Telehealth: Payer: Self-pay | Admitting: Internal Medicine

## 2010-02-22 ENCOUNTER — Ambulatory Visit: Payer: Self-pay | Admitting: Gastroenterology

## 2010-02-23 LAB — CONVERTED CEMR LAB
ALT: 22 units/L (ref 0–35)
AST: 21 units/L (ref 0–37)
Albumin: 3.8 g/dL (ref 3.5–5.2)
Alkaline Phosphatase: 91 units/L (ref 39–117)
BUN: 12 mg/dL (ref 6–23)
Basophils Absolute: 0 10*3/uL (ref 0.0–0.1)
Basophils Relative: 0.5 % (ref 0.0–3.0)
Bilirubin, Direct: 0.1 mg/dL (ref 0.0–0.3)
Creatinine, Ser: 0.9 mg/dL (ref 0.4–1.2)
Folate: 4.5 ng/mL
Glucose, Bld: 96 mg/dL (ref 70–99)
Hemoglobin: 12.6 g/dL (ref 12.0–15.0)
Lymphs Abs: 1.7 10*3/uL (ref 0.7–4.0)
Platelets: 274 10*3/uL (ref 150.0–400.0)
Potassium: 3.7 meq/L (ref 3.5–5.1)
RBC: 4.35 M/uL (ref 3.87–5.11)
RDW: 14.8 % — ABNORMAL HIGH (ref 11.5–14.6)
Sodium: 141 meq/L (ref 135–145)
Total Protein: 6.9 g/dL (ref 6.0–8.3)
WBC: 6.2 10*3/uL (ref 4.5–10.5)

## 2010-03-20 ENCOUNTER — Emergency Department (HOSPITAL_COMMUNITY): Admission: EM | Admit: 2010-03-20 | Discharge: 2010-03-20 | Payer: Self-pay | Admitting: Emergency Medicine

## 2010-03-20 IMAGING — CR DG CHEST 2V
2 series · 2 of 2 positions shown · non-contrast
Comparison: [DATE]

CLINICAL DATA: Chest pain.  Asthma.  Smoker.

CHEST - 2 VIEW

[w chest pa *]
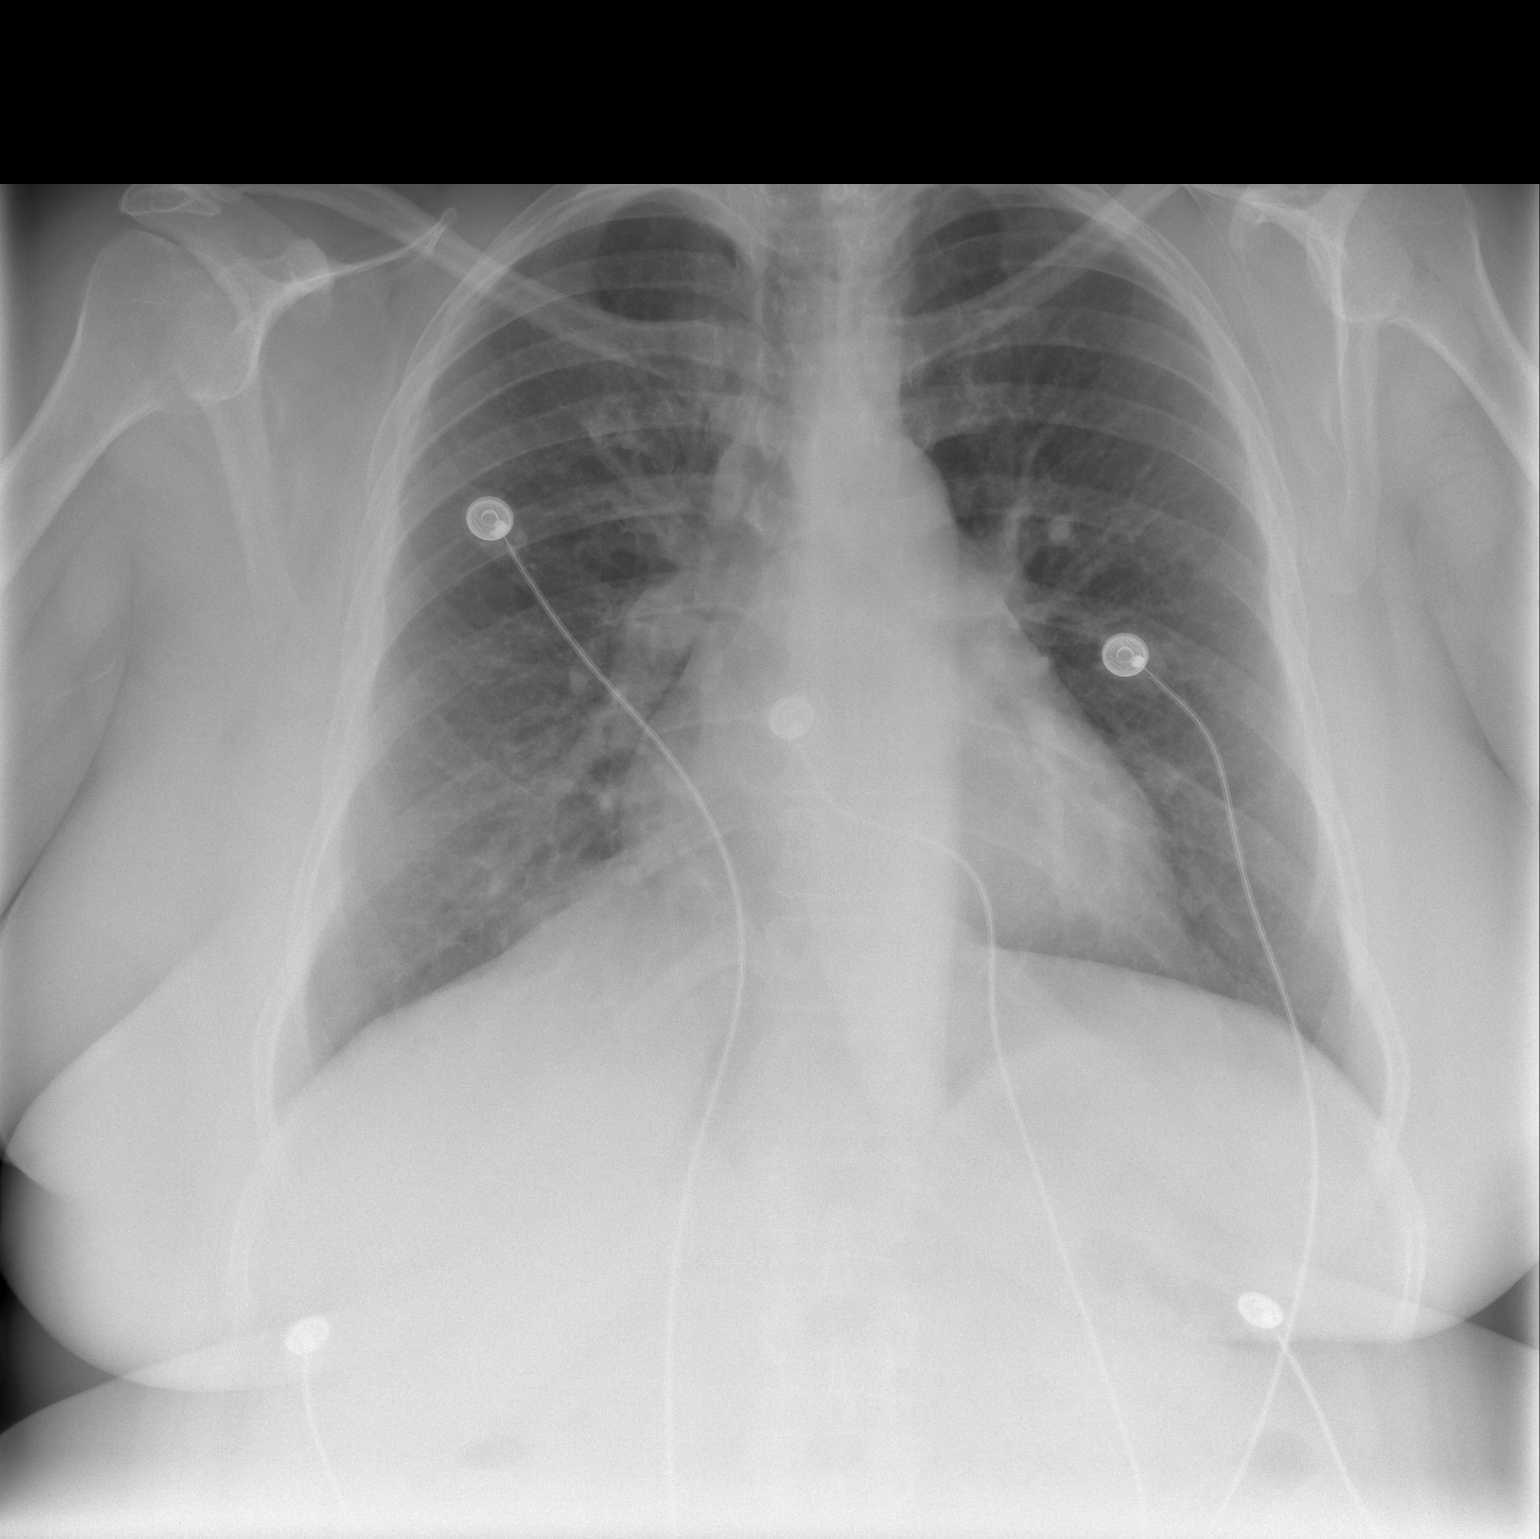

[w chest lat *]
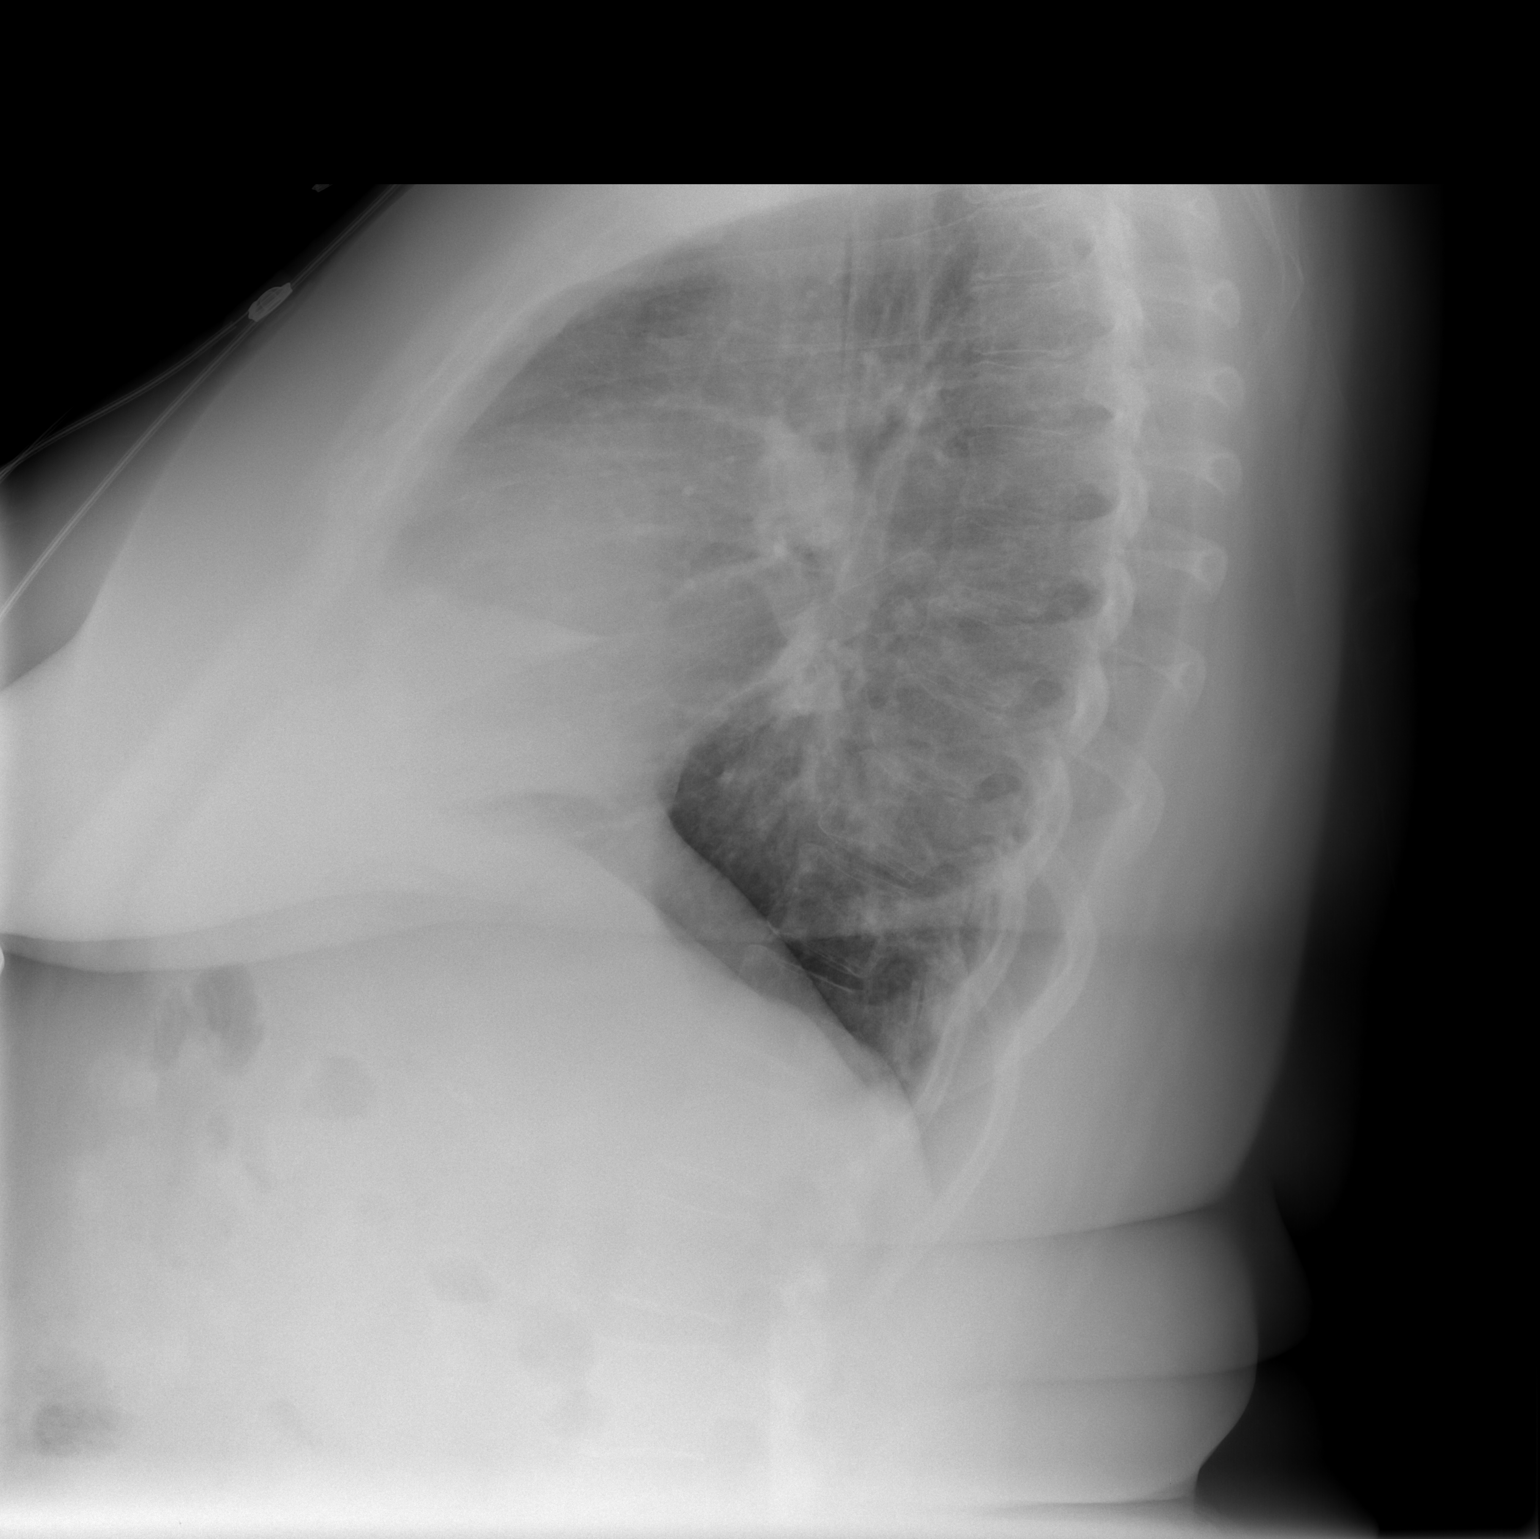

[2 of 2 positions shown; findings below may reference images not displayed]

FINDINGS: Chronic central peribronchial thickening again noted.  No
evidence of acute infiltrate or edema.  No evidence of pleural
effusion.  Heart size and mediastinal contours are within normal
limits.
IMPRESSION: Stable exam.  No active disease.

## 2010-03-22 ENCOUNTER — Ambulatory Visit: Payer: Self-pay | Admitting: Internal Medicine

## 2010-03-22 ENCOUNTER — Telehealth (INDEPENDENT_AMBULATORY_CARE_PROVIDER_SITE_OTHER): Payer: Self-pay | Admitting: *Deleted

## 2010-03-24 ENCOUNTER — Ambulatory Visit: Payer: Self-pay | Admitting: Internal Medicine

## 2010-05-16 ENCOUNTER — Telehealth: Payer: Self-pay | Admitting: Gastroenterology

## 2010-07-25 ENCOUNTER — Other Ambulatory Visit: Payer: Self-pay | Admitting: Internal Medicine

## 2010-07-25 ENCOUNTER — Ambulatory Visit
Admission: RE | Admit: 2010-07-25 | Discharge: 2010-07-25 | Payer: Self-pay | Source: Home / Self Care | Attending: Internal Medicine | Admitting: Internal Medicine

## 2010-07-25 LAB — HEPATIC FUNCTION PANEL
ALT: 18 U/L (ref 0–35)
AST: 19 U/L (ref 0–37)
Albumin: 3.7 g/dL (ref 3.5–5.2)
Alkaline Phosphatase: 69 U/L (ref 39–117)
Bilirubin, Direct: 0.1 mg/dL (ref 0.0–0.3)
Total Bilirubin: 0.4 mg/dL (ref 0.3–1.2)
Total Protein: 6.7 g/dL (ref 6.0–8.3)

## 2010-07-25 LAB — BASIC METABOLIC PANEL
BUN: 11 mg/dL (ref 6–23)
CO2: 25 mEq/L (ref 19–32)
Calcium: 8.7 mg/dL (ref 8.4–10.5)
Chloride: 106 mEq/L (ref 96–112)
Creatinine, Ser: 0.9 mg/dL (ref 0.4–1.2)
GFR: 86.54 mL/min (ref >60.00)
Glucose, Bld: 88 mg/dL (ref 70–99)
Potassium: 3.3 mEq/L — ABNORMAL LOW (ref 3.5–5.1)
Sodium: 138 mEq/L (ref 135–145)

## 2010-07-25 LAB — CBC WITH DIFFERENTIAL/PLATELET
Basophils Absolute: 0 10*3/uL (ref 0.0–0.1)
Basophils Relative: 0.6 % (ref 0.0–3.0)
Eosinophils Absolute: 0 10*3/uL (ref 0.0–0.7)
Eosinophils Relative: 0.2 % (ref 0.0–5.0)
HCT: 36.2 % (ref 36.0–46.0)
Hemoglobin: 12.1 g/dL (ref 12.0–15.0)
Lymphocytes Relative: 30.9 % (ref 12.0–46.0)
Lymphs Abs: 2.1 10*3/uL (ref 0.7–4.0)
MCHC: 33.3 g/dL (ref 30.0–36.0)
MCV: 86.4 fl (ref 78.0–100.0)
Monocytes Absolute: 0.6 10*3/uL (ref 0.1–1.0)
Monocytes Relative: 8.8 % (ref 3.0–12.0)
Neutro Abs: 4 10*3/uL (ref 1.4–7.7)
Neutrophils Relative %: 59.5 % (ref 43.0–77.0)
Platelets: 244 10*3/uL (ref 150.0–400.0)
RBC: 4.19 Mil/uL (ref 3.87–5.11)
RDW: 14.9 % — ABNORMAL HIGH (ref 11.5–14.6)
WBC: 6.7 10*3/uL (ref 4.5–10.5)

## 2010-07-25 LAB — LIPID PANEL
Cholesterol: 208 mg/dL — ABNORMAL HIGH (ref 0–200)
HDL: 63 mg/dL (ref >39.00)
Total CHOL/HDL Ratio: 3
Triglycerides: 88 mg/dL (ref 0.0–149.0)
VLDL: 17.6 mg/dL (ref 0.0–40.0)

## 2010-07-25 LAB — B12 AND FOLATE PANEL
Folate: 4.2 ng/mL — ABNORMAL LOW (ref >5.9)
Vitamin B-12: 568 pg/mL (ref 211–911)

## 2010-07-25 LAB — LDL CHOLESTEROL, DIRECT: Direct LDL: 138.1 mg/dL

## 2010-07-25 LAB — TSH: TSH: 1.66 u[IU]/mL (ref 0.35–5.50)

## 2010-07-26 ENCOUNTER — Encounter: Payer: Self-pay | Admitting: Internal Medicine

## 2010-07-26 ENCOUNTER — Telehealth: Payer: Self-pay | Admitting: Internal Medicine

## 2010-08-09 NOTE — Miscellaneous (Signed)
Summary: [facility]IGN[facility]Y RELEASE/[facility]  DESIGNATED PARTY RELEASE/[facility]   Imported By: Lester Streeter 09/22/2009 09:28:55  _____________________________________________________________________  External Attachment:    Type:   Image     Comment:   External Document

## 2010-08-09 NOTE — Letter (Signed)
Summary: [facility] Instructions  [facility]  [address] [place], [place] [address]   Phone: [phone]  Fax: [phone]       Danielle Holt    [birth year]    MRN: [id number]        Procedure Day /Date:  01/19/10  Wednesday     Arrival Time:  8:00am      Procedure Time: 9:00am     Location of Procedure:                    _ x_  [facility] (4th Floor)                        PREPARATION FOR COLONOSCOPY WITH MOVIPREP   Starting 5 days prior to your procedure 01/14/10 do not eat nuts, seeds, popcorn, corn, beans, peas,  salads, or any raw vegetables.  Do not take any fiber supplements (e.g. Metamucil, Citrucel, and Benefiber).  THE DAY BEFORE YOUR PROCEDURE         DATE:  01/18/10  DAY:  Tuesday  1.  Drink clear liquids the entire day-NO SOLID FOOD  2.  Do not drink anything colored red or purple.  Avoid juices with pulp.  No orange juice.  3.  Drink at least 64 oz. (8 glasses) of fluid/clear liquids during the day to prevent dehydration and help the prep work efficiently.  CLEAR LIQUIDS INCLUDE: Water Jello Ice Popsicles Tea (sugar ok, no milk/cream) Powdered fruit flavored drinks Coffee (sugar ok, no milk/cream) Gatorade Juice: apple, white grape, white cranberry  Lemonade Clear bullion, consomm, broth Carbonated beverages (any kind) Strained chicken noodle soup Hard Candy                             4.  In the morning, mix first dose of MoviPrep solution:    Empty 1 Pouch A and 1 Pouch B into the disposable container    Add lukewarm drinking water to the top line of the container. Mix to dissolve    Refrigerate (mixed solution should be used within 24 hrs)  5.  Begin drinking the prep at 5:00 p.m. The MoviPrep container is divided by 4 marks.   Every 15 minutes drink the solution down to the next mark (approximately 8 oz) until the full liter is complete.   6.  Follow completed prep with 16 oz of clear liquid of your choice  (Nothing red or purple).  Continue to drink clear liquids until bedtime.  7.  Before going to bed, mix second dose of MoviPrep solution:    Empty 1 Pouch A and 1 Pouch B into the disposable container    Add lukewarm drinking water to the top line of the container. Mix to dissolve    Refrigerate  THE DAY OF YOUR PROCEDURE      DATE:  01/19/10  DAY:  Wednesday  Beginning at  4:00 a.m. (5 hours before procedure):         1. Every 15 minutes, drink the solution down to the next mark (approx 8 oz) until the full liter is complete.  2. Follow completed prep with 16 oz. of clear liquid of your choice.    3. You may drink clear liquids until  7:00am  (2 HOURS BEFORE PROCEDURE).   MEDICATION INSTRUCTIONS  Unless otherwise instructed, you should take regular prescription medications with a small sip of water  as early as possible the morning of your procedure.         OTHER INSTRUCTIONS  You will need a responsible adult at least 53 years of age to accompany you and drive you home.   This person must remain in the waiting room during your procedure.  Wear loose fitting clothing that is easily removed.  Leave jewelry and other valuables at home.  However, you may wish to bring a book to read or  an iPod/MP3 player to listen to music as you wait for your procedure to start.  Remove all body piercing jewelry and leave at home.  Total time from sign-in until discharge is approximately 2-3 hours.  You should go home directly after your procedure and rest.  You can resume normal activities the  day after your procedure.  The day of your procedure you should not:   Drive   Make legal decisions   Operate machinery   Drink alcohol   Return to work  You will receive specific instructions about eating, activities and medications before you leave.    The above instructions have been reviewed and explained to me by   Wyona Almas RN  January 11, 2010 8:38 AM     I fully  understand and can verbalize these instructions _____________________________ Date _________

## 2010-08-09 NOTE — Progress Notes (Signed)
Summary: ativan refill  Phone Note Refill Request Message from:  Fax from Pharmacy on January 27, 2010 8:32 AM  Refills Requested: Medication #1:  ATIVAN 1 MG TABS One by mouth three times a day for anxiety   Dosage confirmed as above?Dosage Confirmed   Supply Requested: 1 month   Last Refilled: 10/20/2009   Notes: last given #75  Is this ok to refill for patient ?   [facility] [address]   Method Requested: Telephone to Pharmacy Next Appointment Scheduled: 03/24/2010 Initial call taken by: Rock Nephew CMA,  January 27, 2010 8:33 AM  Follow-up for Phone Call        yes Follow-up by: Etta Grandchild MD,  January 27, 2010 8:52 AM    Prescriptions: ATIVAN 1 MG TABS (LORAZEPAM) One by mouth three times a day for anxiety  #75 x 5   Entered by:   Rock Nephew CMA   Authorized by:   Etta Grandchild MD   Signed by:   Rock Nephew CMA on 01/27/2010   Method used:   Telephoned to ...       [facility]  [address] [phone] ([facility]ail)       [address]       [place], [place]  [address]       Ph: [phone] or [phone]       Fax: [phone]   RxID:   [id number]

## 2010-08-09 NOTE — Assessment & Plan Note (Signed)
Summary: PER 1 MTH YEARLY--W/PAP--STC   Vital Signs:  Patient profile:   55 year old female Height:      62 inches Weight:      266 pounds BMI:     48.83 O2 Sat:      96 % on Room air Temp:     98.8 degrees F oral Pulse rate:   82 / minute Pulse rhythm:   regular Resp:     16 per minute BP sitting:   130 / 88  (left arm) Cuff size:   large  Vitals Entered By: Rock Nephew CMA (October 20, 2009 9:12 AM)  Nutrition Counseling: Patient's BMI is greater than 25 and therefore counseled on weight management options.  O2 Flow:  Room air  Primary Care Provider:  Etta Grandchild MD   History of Present Illness: She still has insomnia and wants ativan. She has not benefited from taking ambein, silenor, or trazadone.  Depression History:      Positive alarm features for depression include significant weight gain, insomnia, fatigue (loss of energy), and feelings of worthlessness (guilt).  However, she denies significant weight loss, hypersomnia, psychomotor agitation, psychomotor retardation, impaired concentration (indecisiveness), and recurrent thoughts of death or suicide.  The patient denies symptoms of a manic disorder including persistently & abnormally elevated mood, abnormally & persistently irritable mood, less need for sleep, talkative or feels need to keep talking, distractibility, flight of ideas, increase in goal-directed activity, psychomotor agitation, inflated self-esteem or grandiosity, excessive buying sprees, excessive sexual indiscretions, and excessive foolish business investments.        Psychosocial stress factors include a recent traumatic event and major life changes.  Risk factors for depression include a personal history of depression.  The patient denies that she feels like life is not worth living, denies that she wishes that she were dead, and denies that she has thought about ending her life.         Depression Treatment History:  Prior Medication Used:   Start  Date: Assessment of Effect:   Comments:  Effexor (venlafaxine)     10/20/2008   side effects enough to stop   weight gain   Preventive Screening-Counseling & Management  Alcohol-Tobacco     Smoking Cessation Counseling: yes  Current Medications (verified): 1)  Effexor Xr 75 Mg Xr24h-Cap (Venlafaxine Hcl) .... 2am and 1qhs 2)  Ibuprofen 800 Mg Tabs (Ibuprofen) .... One By Mouth Three Times A Day As Needed Pain 3)  Excedrin Migraine 250-250-65 Mg Tabs (Aspirin-Acetaminophen-Caffeine) .... As Needed 4)  Ventolin Hfa 108 (90 Base) Mcg/act Aers (Albuterol Sulfate) .Marland Kitchen.. 1-2 Puffs Qid Prn 5)  Silenor 3 Mg Tabs (Doxepin Hcl) .... One By Mouth At Bedtime As Needed For Insomnia  Allergies (verified): 1)  ! * Chantix  Past History:  Past Medical History: Reviewed history from 03/02/2009 and no changes required. COPD Depression GERD Headache Hyperlipidemia Allergic rhinitis Chronic B serous otitis 2010 Asthma  Past Surgical History: Reviewed history from 06/23/2008 and no changes required. CTS release bilaterally Cholecystectomy 1991 Tonsillectomy Knee Arthroscopy x2  Family History: Reviewed history from 06/01/2008 and no changes required. Adopted  Social History: Reviewed history from 06/01/2008 and no changes required. Divorced Current Smoker Drug use-no Regular exercise-no Disabled due to depression  Review of Systems       The patient complains of weight gain.  The patient denies anorexia, fever, weight loss, chest pain, syncope, dyspnea on exertion, peripheral edema, prolonged cough, headaches, hemoptysis, abdominal pain, melena, hematochezia,  severe indigestion/heartburn, hematuria, muscle weakness, suspicious skin lesions, abnormal bleeding, enlarged lymph nodes, angioedema, and breast masses.   GI:  Denies abdominal pain, bloody stools, change in bowel habits, constipation, diarrhea, indigestion, loss of appetite, nausea, vomiting, vomiting blood, and yellowish  skin color. GU:  Denies abnormal vaginal bleeding, decreased libido, discharge, dysuria, genital sores, hematuria, incontinence, nocturia, urinary frequency, and urinary hesitancy.  Physical Exam  General:  alert, well-developed, well-nourished, well-hydrated, and overweight-appearing.   Head:  normocephalic, atraumatic, and no abnormalities observed.   Eyes:  vision grossly intact, pupils equal, pupils round, and pupils reactive to light.   Mouth:  Oral mucosa and oropharynx without lesions or exudates.  Teeth in good repair. Neck:  supple, full ROM, no masses, no cervical lymphadenopathy, and no neck tenderness.   Chest Wall:  No deformities, masses, or tenderness noted. Breasts:  No mass, nodules, thickening, tenderness, bulging, retraction, inflamation, nipple discharge or skin changes noted.   Lungs:  Normal respiratory effort, chest expands symmetrically. Lungs are clear to auscultation, no crackles or wheezes. Heart:  Normal rate and regular rhythm. S1 and S2 normal without gallop, murmur, click, rub or other extra sounds. Abdomen:  soft, non-tender, normal bowel sounds, no distention, no masses, no guarding, no rigidity, no rebound tenderness, no abdominal hernia, no inguinal hernia, no hepatomegaly, no splenomegaly, and abdominal scar(s).   Rectal:  No external abnormalities noted. Normal sphincter tone. No rectal masses or tenderness. heme negative stool. Genitalia:  Normal introitus for age, no external lesions, no vaginal discharge, mucosa pink and moist, no vaginal or cervical lesions, no vaginal atrophy, no friaility or hemorrhage, normal uterus size and position, no adnexal masses or tenderness Msk:  No deformity or scoliosis noted of thoracic or lumbar spine.   Pulses:  R and L carotid,radial,femoral,dorsalis pedis and posterior tibial pulses are full and equal bilaterally Extremities:  trace left pedal edema and trace right pedal edema.   Neurologic:  No cranial nerve deficits  noted. Station and gait are normal. Plantar reflexes are down-going bilaterally. DTRs are symmetrical throughout. Sensory, motor and coordinative functions appear intact. Skin:  turgor normal, color normal, no rashes, no suspicious lesions, no ecchymoses, no petechiae, no purpura, no ulcerations, and no edema.   Cervical Nodes:  No lymphadenopathy noted Axillary Nodes:  No palpable lymphadenopathy Inguinal Nodes:  No significant adenopathy Psych:  Cognition and judgment appear intact. Alert and cooperative with normal attention span and concentration. No apparent delusions, illusions, hallucinations Additional Exam:  EKG shows NSR with PRWP in V1 and V2. There are no Q waves and no acute ST/T wave changes. There is no change from her previous EKG.   Impression & Recommendations:  Problem # 1:  EDEMA- LOCALIZED (ICD-782.3) Assessment New  Orders: Venipuncture (21308) TLB-Lipid Panel (80061-LIPID) TLB-BMP (Basic Metabolic Panel-BMET) (80048-METABOL) TLB-CBC Platelet - w/Differential (85025-CBCD) TLB-Hepatic/Liver Function Pnl (80076-HEPATIC) TLB-TSH (Thyroid Stimulating Hormone) (84443-TSH) EKG w/ Interpretation (93000)  Problem # 2:  ROUTINE GENERAL MEDICAL EXAM@HEALTH  CARE FACL (ICD-V70.0)  Orders: Gastroenterology Referral (GI) Venipuncture (65784) TLB-Lipid Panel (80061-LIPID) TLB-BMP (Basic Metabolic Panel-BMET) (80048-METABOL) TLB-CBC Platelet - w/Differential (85025-CBCD) TLB-Hepatic/Liver Function Pnl (80076-HEPATIC) TLB-TSH (Thyroid Stimulating Hormone) (84443-TSH) Hemoccult Guaiac-1 spec.(in office) (69629) Radiology Referral (Radiology)  Problem # 3:  INSOMNIA (ICD-780.52) Assessment: Deteriorated  The following medications were removed from the medication list:    Silenor 3 Mg Tabs (Doxepin hcl) ..... One by mouth at bedtime as needed for insomnia    Zolpidem Tartrate 10 Mg Tabs (Zolpidem tartrate) ..... One by mouth  at bedtime as needed for insomnia  Problem # 4:   HYPERLIPIDEMIA (ICD-272.4) Assessment: Unchanged  Orders: Venipuncture ([address]) TLB-Lipid Panel (80061-LIPID) TLB-BMP (Basic Metabolic Panel-BMET) (80048-METABOL) TLB-CBC Platelet - w/Differential (85025-CBCD) TLB-Hepatic/Liver Function Pnl (80076-HEPATIC) TLB-TSH (Thyroid Stimulating Hormone) (84443-TSH)  Labs Reviewed: SGOT: 17 (03/02/2009)   SGPT: 16 (03/02/2009)  Lipid Goals: Chol Goal: 200 (06/16/2008)   HDL Goal: 40 (06/16/2008)   LDL Goal: 160 (06/16/2008)   TG Goal: 150 (06/16/2008)  Prior 10 Yr Risk Heart Disease: 6 % (06/16/2008)   HDL:76.6 (06/01/2008)  LDL:DEL (06/01/2008)  Chol:203 (06/01/2008)  Trig:107 (06/01/2008)  Problem # 5:  GERD (ICD-530.81) Assessment: Improved  Orders: Venipuncture ([address]454) TLB-Lipid Panel (80061-LIPID) TLB-BMP (Basic Metabolic Panel-BMET) (80048-METABOL) TLB-CBC Platelet - w/Differential (85025-CBCD) TLB-Hepatic/Liver Function Pnl (80076-HEPATIC) TLB-TSH (Thyroid Stimulating Hormone) (84443-TSH) Hemoccult Guaiac-1 spec.(in office) (82270)  Problem # 6:  DEPRESSION (ICD-311) Assessment: Deteriorated she is not happy with effexor so we discussed a slow taper over the next 2 months Her updated medication list for this problem includes:    Effexor Xr 75 Mg Xr24h-cap (Venlafaxine hcl) .Marland Kitchen... 2am and 1qhs    Ativan 1 Mg Tabs (Lorazepam) ..... One by mouth three times a day for anxiety  Problem # 7:  TOBACCO USE (ICD-305.1) Assessment: Unchanged  Encouraged smoking cessation and discussed different methods for smoking cessation.   Orders: Tobacco use cessation intermediate 3-10 minutes (99406)  Complete Medication List: 1)  Effexor Xr 75 Mg Xr24h-cap (Venlafaxine hcl) .... 2am and 1qhs 2)  Ibuprofen 800 Mg Tabs (Ibuprofen) .... One by mouth three times a day as needed pain 3)  Excedrin Migraine 250-250-65 Mg Tabs (Aspirin-acetaminophen-caffeine) .... As needed 4)  Ventolin Hfa 108 (90 Base) Mcg/act Aers (Albuterol sulfate) .Marland Kitchen.. 1-2  puffs qid prn 5)  Ativan 1 Mg Tabs (Lorazepam) .... One by mouth three times a day for anxiety  Other Orders: Admin of Therapeutic Inj  intramuscular or subcutaneous (09811) Vit B12 1000 mcg (B1478)  Colorectal Screening:  Current Recommendations:    Hemoccult: NEG X 1 today    Colonoscopy recommended: scheduled with G.I.  PAP Screening:    Hx Cervical Dysplasia in last 5 yrs? No    3 normal PAP smears in last 5 yrs? Yes    Reviewed PAP smear recommendations:  PAP smear done  Mammogram Screening:    Reviewed Mammogram recommendations:  mammogram ordered  Osteoporosis Risk Assessment:  Risk Factors for Fracture or Low Bone Density:   Smoking status:       current  Immunization & Chemoprophylaxis:    Tetanus vaccine: Tdap  (09/14/2009)    Influenza vaccine: Fluvax 3+  (04/19/2009)    Pneumovax: Pneumovax  (04/19/2009)  Patient Instructions: 1)  Please schedule a follow-up appointment in 1 month. 2)  Tobacco is very bad for your health and your loved ones! You Should stop smoking!. 3)  Stop Smoking Tips: Choose a Quit date. Cut down before the Quit date. decide what you will do as a substitute when you feel the urge to smoke(gum,toothpick,exercise). 4)  It is important that you exercise regularly at least 20 minutes 5 times a week. If you develop chest pain, have severe difficulty breathing, or feel very tired , stop exercising immediately and seek medical attention. 5)  You need to lose weight. Consider a lower calorie diet and regular exercise.  6)  Schedule your mammogram. 7)  Schedule a colonoscopy/sigmoidoscopy to help detect colon cancer. 8)  You need to have a Pap Smear to prevent cervical cancer.  Prescriptions: ATIVAN 1 MG TABS (LORAZEPAM) One by mouth three times a day for anxiety  #75 x 2   Entered and Authorized by:   Etta Grandchild MD   Signed by:   Etta Grandchild MD on 10/20/2009   Method used:   Print then Give to Patient   RxID:   [id number] ZOLPIDEM  TARTRATE 10 MG TABS (ZOLPIDEM TARTRATE) One by mouth at bedtime as needed for insomnia  #30 x 3   Entered and Authorized by:   Etta Grandchild MD   Signed by:   Etta Grandchild MD on 10/20/2009   Method used:   Print then Give to Patient   RxID:   [id number]    Tetanus/Td Immunization History:    Tetanus/Td # 1:  Tdap (09/14/2009)      Medication Administration  Injection # 1:    Medication: Vit B12 1000 mcg    Diagnosis: B12 DEFICIENCY (ICD-266.2)    Route: IM    Site: R deltoid    Exp Date: 04/2011    Lot #: [id number]    Mfr: [employer]    Patient tolerated injection without complications    Given by: Rock Nephew CMA (October 20, 2009 10:11 AM)  Orders Added: 1)  Gastroenterology Referral [GI] 2)  Venipuncture [44034] 3)  TLB-Lipid Panel [80061-LIPID] 4)  TLB-BMP (Basic Metabolic Panel-BMET) [80048-METABOL] 5)  TLB-CBC Platelet - w/Differential [85025-CBCD] 6)  TLB-Hepatic/Liver Function Pnl [80076-HEPATIC] 7)  TLB-TSH (Thyroid Stimulating Hormone) [84443-TSH] 8)  Hemoccult Guaiac-1 spec.(in office) [82270] 9)  Admin of Therapeutic Inj  intramuscular or subcutaneous [96372] 10)  Vit B12 1000 mcg [J3420] 11)  Radiology Referral [Radiology] 12)  EKG w/ Interpretation [93000] 13)  Tobacco use cessation intermediate 3-10 minutes [99406] 14)  Est. Patient Level V [74259]

## 2010-08-09 NOTE — Assessment & Plan Note (Signed)
Summary: 1 MTH FU  STC   Vital Signs:  Patient profile:   55 year old female Height:      62 inches Weight:      255 pounds BMI:     46.81 O2 Sat:      98 % on Room air Temp:     98.2 degrees F oral Pulse rate:   96 / minute Pulse rhythm:   regular Resp:     16 per minute BP sitting:   134 / 86  (left arm) Cuff size:   large  Vitals Entered By: Rock Nephew CMA (September 20, 2009 8:26 AM)  Nutrition Counseling: Patient's BMI is greater than 25 and therefore counseled on weight management options.  O2 Flow:  Room air CC: follow-up visit Is Patient Diabetic? No Pain Assessment Patient in pain? no        Primary Care Provider:  Etta Grandchild MD  CC:  follow-up visit.  History of Present Illness: She returns for f/up and says that she had to stop taking ambien due to behavior changes. She has persistent insomnia with DFA but no FA's or EMA's. She denies any s/s of depression or insomnia.  Preventive Screening-Counseling & Management  Alcohol-Tobacco     Alcohol drinks/day: 0     Smoking Status: current     Smoking Cessation Counseling: yes     Smoke Cessation Stage: precontemplative     Packs/Day: 1     Year Started: 1968     Cans of tobacco/week: no     Passive Smoke Exposure: yes  Hep-HIV-STD-Contraception     HIV Risk: no      Drug Use:  no.    Medications Prior to Update: 1)  Effexor Xr 75 Mg Xr24h-Cap (Venlafaxine Hcl) .... 2am and 1qhs 2)  Ibuprofen 800 Mg Tabs (Ibuprofen) .... One By Mouth Three Times A Day As Needed Pain 3)  Topiramate 100 Mg Tabs (Topiramate) .... One Po At Bedtime 4)  Ambien 10 Mg Tabs (Zolpidem Tartrate) .... Take 1 Tab By Mouth At Bedtime 5)  Excedrin Migraine 250-250-65 Mg Tabs (Aspirin-Acetaminophen-Caffeine) .... As Needed 6)  Ventolin Hfa 108 (90 Base) Mcg/act Aers (Albuterol Sulfate) .Marland Kitchen.. 1-2 Puffs Qid Prn  Current Medications (verified): 1)  Effexor Xr 75 Mg Xr24h-Cap (Venlafaxine Hcl) .... 2am and 1qhs 2)  Ibuprofen 800  Mg Tabs (Ibuprofen) .... One By Mouth Three Times A Day As Needed Pain 3)  Excedrin Migraine 250-250-65 Mg Tabs (Aspirin-Acetaminophen-Caffeine) .... As Needed 4)  Ventolin Hfa 108 (90 Base) Mcg/act Aers (Albuterol Sulfate) .Marland Kitchen.. 1-2 Puffs Qid Prn 5)  Silenor 3 Mg Tabs (Doxepin Hcl) .... One By Mouth At Bedtime As Needed For Insomnia  Allergies (verified): 1)  ! * Chantix  Past History:  Past Medical History: Reviewed history from 03/02/2009 and no changes required. COPD Depression GERD Headache Hyperlipidemia Allergic rhinitis Chronic B serous otitis 2010 Asthma  Past Surgical History: Reviewed history from 06/23/2008 and no changes required. CTS release bilaterally Cholecystectomy 1991 Tonsillectomy Knee Arthroscopy x2  Family History: Reviewed history from 06/01/2008 and no changes required. Adopted  Social History: Reviewed history from 06/01/2008 and no changes required. Divorced Current Smoker Drug use-no Regular exercise-no Disabled due to depression  Review of Systems  The patient denies anorexia, fever, chest pain, peripheral edema, prolonged cough, abdominal pain, hematuria, difficulty walking, and depression.   Psych:  Denies alternate hallucination ( auditory/visual), anxiety, depression, easily angered, easily tearful, irritability, mental problems, panic attacks, sense of great  danger, suicidal thoughts/plans, thoughts of violence, unusual visions or sounds, and thoughts /plans of harming others.  Physical Exam  General:  alert, well-developed, well-nourished, well-hydrated, and overweight-appearing.   Mouth:  Oral mucosa and oropharynx without lesions or exudates.  Teeth in good repair. Neck:  supple, full ROM, no masses, no cervical lymphadenopathy, and no neck tenderness.   Lungs:  Normal respiratory effort, chest expands symmetrically. Lungs are clear to auscultation, no crackles or wheezes. Heart:  Normal rate and regular rhythm. S1 and S2 normal  without gallop, murmur, click, rub or other extra sounds. Abdomen:  Bowel sounds positive,abdomen soft and non-tender without masses, organomegaly or hernias noted. Msk:  normal ROM, no joint tenderness, and no joint swelling.   Pulses:  R and L carotid,radial,femoral,dorsalis pedis and posterior tibial pulses are full and equal bilaterally Extremities:  No clubbing, cyanosis, edema, or deformity noted with normal full range of motion of all joints.   Neurologic:  No cranial nerve deficits noted. Station and gait are normal. Plantar reflexes are down-going bilaterally. DTRs are symmetrical throughout. Sensory, motor and coordinative functions appear intact. Skin:  Intact without suspicious lesions or rashes Cervical Nodes:  No lymphadenopathy noted Psych:  Cognition and judgment appear intact. Alert and cooperative with normal attention span and concentration. No apparent delusions, illusions, hallucinationsnormally interactive, good eye contact, not anxious appearing, not depressed appearing, not agitated, not suicidal, and not homicidal.     Impression & Recommendations:  Problem # 1:  INSOMNIA (ICD-780.52) Assessment New  The following medications were removed from the medication list:    Ambien 10 Mg Tabs (Zolpidem tartrate) .Marland Kitchen... Take 1 tab by mouth at bedtime Her updated medication list for this problem includes:    Silenor 3 Mg Tabs (Doxepin hcl) ..... One by mouth at bedtime as needed for insomnia  Discussed sleep hygiene.   Problem # 2:  B12 DEFICIENCY (ICD-266.2) Assessment: Unchanged  Problem # 3:  DEPRESSION (ICD-311) Assessment: Improved  Her updated medication list for this problem includes:    Effexor Xr 75 Mg Xr24h-cap (Venlafaxine hcl) .Marland Kitchen... 2am and 1qhs  Problem # 4:  TOBACCO USE (ICD-305.1) Assessment: Unchanged  Encouraged smoking cessation and discussed different methods for smoking cessation.   Complete Medication List: 1)  Effexor Xr 75 Mg Xr24h-cap  (Venlafaxine hcl) .... 2am and 1qhs 2)  Ibuprofen 800 Mg Tabs (Ibuprofen) .... One by mouth three times a day as needed pain 3)  Excedrin Migraine 250-250-65 Mg Tabs (Aspirin-acetaminophen-caffeine) .... As needed 4)  Ventolin Hfa 108 (90 Base) Mcg/act Aers (Albuterol sulfate) .Marland Kitchen.. 1-2 puffs qid prn 5)  Silenor 3 Mg Tabs (Doxepin hcl) .... One by mouth at bedtime as needed for insomnia  Patient Instructions: 1)  Please schedule a follow-up appointment in 1 month. 2)  It is important that you exercise regularly at least 20 minutes 5 times a week. If you develop chest pain, have severe difficulty breathing, or feel very tired , stop exercising immediately and seek medical attention. 3)  You need to lose weight. Consider a lower calorie diet and regular exercise.  Prescriptions: SILENOR 3 MG TABS (DOXEPIN HCL) One by mouth at bedtime as needed for insomnia  #20 x 0   Entered and Authorized by:   Etta Grandchild MD   Signed by:   Etta Grandchild MD on 09/20/2009   Method used:   Samples Given   RxID:   [id number]    Tetanus Vaccine (to be given today)    Medication  Administration  Injection # 1:    Medication: Vit B12 1000 mcg    Diagnosis: B12 DEFICIENCY (ICD-266.2)    Route: IM    Exp Date: 05/2011    Lot #: [id number]    Mfr: American Regent    Patient tolerated injection without complications    Given by: Rock Nephew CMA (September 20, 2009 8:30 AM)  Orders Added: 1)  Est. Patient Level IV [[address]]

## 2010-08-09 NOTE — Miscellaneous (Signed)
Summary: [facility] Previsit/prep  Clinical Lists Changes  Medications: Added new medication of MOVIPREP 100 GM  SOLR (PEG-KCL-NACL-NASULF-NA ASC-C) As per prep instructions. - Signed Rx of MOVIPREP 100 GM  SOLR (PEG-KCL-NACL-NASULF-NA ASC-C) As per prep instructions.;  #1 x 0;  Signed;  Entered by: Wyona Almas RN;  Authorized by: Mardella Layman MD [facility];  Method used: [facility]  [address] [phone]*, [address] [place], [place]  [address], Ph: [phone] or [phone], Fax: [phone] Allergies: Added new allergy or adverse reaction of AMBIEN    Prescriptions: MOVIPREP 100 GM  SOLR (PEG-KCL-NACL-NASULF-NA ASC-C) As per prep instructions.  #1 x 0   Entered by:   Wyona Almas RN   Authorized by:   Mardella Layman MD [facility]   Signed by:   Wyona Almas RN on 01/11/2010   Method used:   [facility]        [facility]  [address] [phone]* (retail)       [address]       [place], [place]  [address]       Ph: [phone] or [phone]       Fax: [phone]   RxID:   [id number]

## 2010-08-09 NOTE — Progress Notes (Signed)
Summary: Samples & RX  Phone Note Call from Patient Call back at Home Phone [phone] Call back at Work Phone [phone]   Summary of Call: Patient is requesting samples of silenor and rx for nicotine patch.  Initial call taken by: Lamar Sprinkles, CMA,  Nov 19, 2009 8:31 AM  Follow-up for Phone Call        Pt informed  Follow-up by: Lamar Sprinkles, CMA,  Nov 19, 2009 1:31 PM    New/Updated Medications: NICOTINE 21 MG/24HR PT24 (NICOTINE) use once daily as needed SILENOR 3 MG TABS (DOXEPIN HCL) 1-2 at bedtime as needed for insomnia Prescriptions: SILENOR 3 MG TABS (DOXEPIN HCL) 1-2 at bedtime as needed for insomnia  #120 x 0   Entered and Authorized by:   Etta Grandchild MD   Signed by:   Etta Grandchild MD on 11/19/2009   Method used:   Samples Given   RxID:   [id number] NICOTINE 21 MG/24HR PT24 (NICOTINE) use once daily as needed  #30 x 6   Entered and Authorized by:   Etta Grandchild MD   Signed by:   Etta Grandchild MD on 11/19/2009   Method used:   Print then Give to Patient   RxID:   [id number]

## 2010-08-09 NOTE — Letter (Signed)
Summary: Lipid Letter  [facility]  [address] [place], [place] [address]   Phone: [phone]  Fax: [phone]    10/20/2009  Danielle Holt [address] [place], [place]  [address]  Dear Danielle Holt:  We have carefully reviewed your last lipid profile from 06/01/2008 and the results are noted below with a summary of recommendations for lipid management.    Cholesterol:       244     Goal: <200   HDL "good" Cholesterol:   57.84     Goal: >40   LDL "bad" Cholesterol:   154     Goal: <160   Triglycerides:       89.0     Goal: <150        TLC Diet (Therapeutic Lifestyle Change): Saturated Fats & Transfatty acids should be kept < 7% of total calories ***Reduce Saturated Fats Polyunstaurated Fat can be up to 10% of total calories Monounsaturated Fat Fat can be up to 20% of total calories Total Fat should be no greater than 25-35% of total calories Carbohydrates should be 50-60% of total calories Protein should be approximately 15% of total calories Fiber should be at least 20-30 grams a day ***Increased fiber may help lower LDL Total Cholesterol should be < 200mg /day Consider adding plant stanol/sterols to diet (example: Benacol spread) ***A higher intake of unsaturated fat may reduce Triglycerides and Increase HDL    Adjunctive Measures (may lower LIPIDS and reduce risk of Heart Attack) include: Aerobic Exercise (20-30 minutes 3-4 times a week) Limit Alcohol Consumption Weight Reduction Aspirin 75-81 mg a day by mouth (if not allergic or contraindicated) Dietary Fiber 20-30 grams a day by mouth     Current Medications: 1)    Effexor Xr 75 Mg Xr24h-cap (Venlafaxine hcl) .... 2am and 1qhs 2)    Ibuprofen 800 Mg Tabs (Ibuprofen) .... One by mouth three times a day as needed pain 3)    Excedrin Migraine 250-250-65 Mg Tabs (Aspirin-acetaminophen-caffeine) .... As needed 4)    Ventolin Hfa 108 (90 Base) Mcg/act Aers (Albuterol sulfate) .Marland Kitchen.. 1-2 puffs qid  prn 5)    Ativan 1 Mg Tabs (Lorazepam) .... One by mouth three times a day for anxiety  If you have any questions, please call. We appreciate being able to work with you.   Sincerely,    [facility] Etta Grandchild MD

## 2010-08-09 NOTE — Assessment & Plan Note (Signed)
Summary: 1 MTH FU  STC   Vital Signs:  Patient profile:   55 year old female Height:      62 inches Weight:      252 pounds O2 Sat:      97 % on Room air Temp:     97.9 degrees F oral Pulse rate:   90 / minute Pulse rhythm:   regular Resp:     16 per minute BP sitting:   114 / 80  (right arm)  O2 Flow:  Room air  Primary Care Provider:  Etta Grandchild MD   History of Present Illness: She returns for f/up and has not had a good response to Adipex so she chooses to stop it. She wants to quit smoking.  Asthma History    Asthma Control Assessment:    Age range: 12+ years    Symptoms: 0-2 days/week    Nighttime Awakenings: 0-2/month    Interferes w/ normal activity: no limitations    SABA use (not for EIB): 0-2 days/week    ATAQ questionnaire: 0    Asthma Control Assessment: Well Controlled   Preventive Screening-Counseling & Management  Alcohol-Tobacco     Smoking Cessation Counseling: yes  Allergies: No Known Drug Allergies  Past History:  Past Medical History: Reviewed history from 03/02/2009 and no changes required. COPD Depression GERD Headache Hyperlipidemia Allergic rhinitis Chronic B serous otitis 2010 Asthma  Past Surgical History: Reviewed history from 06/23/2008 and no changes required. CTS release bilaterally Cholecystectomy 1991 Tonsillectomy Knee Arthroscopy x2  Family History: Reviewed history from 06/01/2008 and no changes required. Adopted  Social History: Reviewed history from 06/01/2008 and no changes required. Divorced Current Smoker Drug use-no Regular exercise-no Disabled due to depression  Review of Systems       The patient complains of weight gain.  The patient denies chest pain, peripheral edema, headaches, abdominal pain, hematuria, difficulty walking, and depression.    Physical Exam  General:  alert, well-developed, well-nourished, well-hydrated, and overweight-appearing.   Mouth:  Oral mucosa and oropharynx  without lesions or exudates.  Teeth in good repair. Neck:  supple, full ROM, no masses, no cervical lymphadenopathy, and no neck tenderness.   Lungs:  Normal respiratory effort, chest expands symmetrically. Lungs are clear to auscultation, no crackles or wheezes. Heart:  Normal rate and regular rhythm. S1 and S2 normal without gallop, murmur, click, rub or other extra sounds. Abdomen:  Bowel sounds positive,abdomen soft and non-tender without masses, organomegaly or hernias noted. Msk:  normal ROM, no joint tenderness, and no joint swelling.   Pulses:  R and L carotid,radial,femoral,dorsalis pedis and posterior tibial pulses are full and equal bilaterally Extremities:  No clubbing, cyanosis, edema, or deformity noted with normal full range of motion of all joints.   Neurologic:  No cranial nerve deficits noted. Station and gait are normal. Plantar reflexes are down-going bilaterally. DTRs are symmetrical throughout. Sensory, motor and coordinative functions appear intact. Skin:  Intact without suspicious lesions or rashes Cervical Nodes:  No lymphadenopathy noted Psych:  Cognition and judgment appear intact. Alert and cooperative with normal attention span and concentration. No apparent delusions, illusions, hallucinations   Impression & Recommendations:  Problem # 1:  B12 DEFICIENCY (ICD-266.2) Assessment Unchanged  Problem # 2:  ASTHMA (ICD-493.90) Assessment: Improved  Her updated medication list for this problem includes:    Proair Hfa 108 (90 Base) Mcg/act Aers (Albuterol sulfate) .Marland Kitchen... 1-2 puffs qid as needed for wheezing  Orders: Tobacco use cessation intermediate 3-10  minutes ([address])  Problem # 3:  MORBID OBESITY (ICD-278.01) Assessment: Deteriorated  Problem # 4:  TOBACCO USE (ICD-305.1) Assessment: Unchanged  Her updated medication list for this problem includes:    Chantix Starting Month Pak 0.5 Mg X 11 & 1 Mg X 42 Tabs (Varenicline tartrate) .Marland Kitchen... Take as  directed  Orders: Tobacco use cessation intermediate 3-10 minutes ([address])  Complete Medication List: 1)  Effexor Xr 75 Mg Xr24h-cap (Venlafaxine hcl) .... 2am and 1qhs 2)  Ibuprofen 800 Mg Tabs (Ibuprofen) .... One by mouth three times a day as needed pain 3)  Topiramate 100 Mg Tabs (Topiramate) .... One po at bedtime 4)  Ambien 10 Mg Tabs (Zolpidem tartrate) .... Take 1 tab by mouth at bedtime 5)  Excedrin Migraine 250-250-65 Mg Tabs (Aspirin-acetaminophen-caffeine) .... As needed 6)  Proair Hfa 108 (90 Base) Mcg/act Aers (Albuterol sulfate) .Marland Kitchen.. 1-2 puffs qid as needed for wheezing 7)  Chantix Starting Month Pak 0.5 Mg X 11 & 1 Mg X 42 Tabs (Varenicline tartrate) .... Take as directed  Patient Instructions: 1)  Please schedule a follow-up appointment in 1 month for your next B12 injection.Marland Kitchen 2)  Tobacco is very bad for your health and your loved ones! You Should stop smoking!. 3)  Stop Smoking Tips: Choose a Quit date. Cut down before the Quit date. decide what you will do as a substitute when you feel the urge to smoke(gum,toothpick,exercise). 4)  It is important that you exercise regularly at least 20 minutes 5 times a week. If you develop chest pain, have severe difficulty breathing, or feel very tired , stop exercising immediately and seek medical attention. 5)  You need to lose weight. Consider a lower calorie diet and regular exercise.  Prescriptions: CHANTIX STARTING MONTH PAK 0.5 MG X 11 & 1 MG X 42 TABS (VARENICLINE TARTRATE) Take as directed  #1 x 0   Entered and Authorized by:   Etta Grandchild MD   Signed by:   Etta Grandchild MD on 07/15/2009   Method used:   [facility]  [address] [phone]* (retail)       [address]       [place], [place]  [address]       Ph: [phone] or [phone]       Fax: [phone]   RxID:   [id number]   Appended Document: 1 MTH FU  STC    Clinical Lists Changes  Orders: Added new Service order of  Admin of Therapeutic Inj  intramuscular or subcutaneous (15176) - Signed Added new Service order of Vit B12 1000 mcg (H6073) - Signed       Medication Administration  Injection # 1:    Medication: Vit B12 1000 mcg    Diagnosis: B12 DEFICIENCY (ICD-266.2)    Route: IM    Site: L deltoid    Exp Date: 09/2010    Lot #: [id number]    Mfr: American Regent    Patient tolerated injection without complications    Given by: Rock Nephew CMA (July 15, 2009 9:33 AM)  Orders Added: 1)  Admin of Therapeutic Inj  intramuscular or subcutaneous [96372] 2)  Vit B12 1000 mcg [J3420]

## 2010-08-09 NOTE — Procedures (Signed)
Summary: Colonoscopy  Patient: Danielle Holt Note: All result statuses are Final unless otherwise noted.  Tests: (1) Colonoscopy (COL)   COL Colonoscopy           DONE     [facility]     [address]     [place], [place]  [address]           COLONOSCOPY PROCEDURE REPORT           PATIENT:  [name], [name]  MR#:  [id number]     BIRTHDATE:  1955/12/05, 54 yrs. old  GENDER:  female     ENDOSCOPIST:  Vania Rea. Jarold Motto, MD, [facility]     REF. BY:  Etta Grandchild, M.D.     PROCEDURE DATE:  01/19/2010     PROCEDURE:  Colonoscopy with biopsy     ASA CLASS:  Class II     INDICATIONS:  change in bowel habits, colorectal cancer screening           MEDICATIONS:   Fentanyl 75 mcg IV, Versed 6 mg IV           DESCRIPTION OF PROCEDURE:   After the risks benefits and     alternatives of the procedure were thoroughly explained, informed     consent was obtained.  Digital rectal exam was performed and     revealed no abnormalities.   The LB [id number] endoscope     was introduced through the anus and advanced to the cecum, which     was identified by both the appendix and ileocecal valve, without     limitations.  The quality of the prep was good, using MoviPrep.     The instrument was then slowly withdrawn as the colon was fully     examined.     <<PROCEDUREIMAGES>>           FINDINGS:  Scattered diverticula were found throughout the colon.     No polyps or cancers were seen.  This was otherwise a normal     examination of the colon. random biopsies done.   Retroflexed     views in the rectum revealed no abnormalities.    The scope was     then withdrawn from the patient and the procedure completed.           COMPLICATIONS:  None     ENDOSCOPIC IMPRESSION:     1) Diverticula, scattered throughout the colon     2) No polyps or cancers     3) Otherwise normal examination     CHRONIC IBS.R/O MICROSCOPIC COLITIS.     RECOMMENDATIONS:     1) metamucil or benefiber  2) Await biopsy results     3) Repeat Colonscopy in 10 years.     4) follow-up: office 1 month(s)     REPEAT EXAM:  No           ______________________________     Vania Rea. Jarold Motto, MD, [facility]           CC:           n.     eSIGNED:   Vania Rea. Rankin Coolman at 01/19/2010 09:31 AM           Lucio Edward, [id number]  Note: An exclamation mark (!) indicates a result that was not dispersed into the flowsheet. Document Creation Date: 01/19/2010 9:32 AM _______________________________________________________________________  (1) Order result status: Final Collection or observation date-time: 01/19/2010 09:22 Requested date-time:  Receipt  date-time:  Reported date-time:  Referring Physician:   Ordering Physician: Sheryn Bison [id number]) Specimen Source:  Source: Launa Grill Order Number: [id number] Lab site:   Appended Document: Colonoscopy     Procedures Next Due Date:    Colonoscopy: 01/2020

## 2010-08-09 NOTE — Letter (Signed)
Summary: Previsit letter  [facility]  [address] [place], [place] [address]   Phone: [phone]  Fax: [phone]       11/23/2009 MRN: [id number]  [facility] [address] [place], [place]  [address]  Dear Ms. Hoiland,  Welcome to the Gastroenterology Division at [facility].    You are scheduled to see a nurse for your pre-procedure visit on  01-04-10 at 8:30am on the 3rd floor at [facility], [address]  We ask that you try to arrive at our office 15 minutes prior to your appointment time to allow for check-in.  Your nurse visit will consist of discussing your medical and surgical history, your immediate family medical history, and your medications.    Please bring a complete list of all your medications or, if you prefer, bring the medication bottles and we will list them.  We will need to be aware of both prescribed and over the counter drugs.  We will need to know exact dosage information as well.  If you are on blood thinners (Coumadin, Plavix, Aggrenox, Ticlid, etc.) please call our office today/prior to your appointment, as we need to consult with your physician about holding your medication.   Please be prepared to read and sign documents such as consent forms, a financial agreement, and acknowledgement forms.  If necessary, and with your consent, a friend or relative is welcome to sit-in on the nurse visit with you.  Please bring your insurance card so that we may make a copy of it.  If your insurance requires a referral to see a specialist, please bring your referral form from your primary care physician.  No co-pay is required for this nurse visit.     If you cannot keep your appointment, please call [phone] to cancel or reschedule prior to your appointment date.  This allows [place] the opportunity to schedule an appointment for another patient in need of care.    Thank you for choosing [facility] for your medical  needs.  We appreciate the opportunity to care for you.  Please visit [place] at our website  to learn more about our practice.                     Sincerely.                                                                                                                   The Gastroenterology Division

## 2010-08-09 NOTE — Assessment & Plan Note (Signed)
Summary: 1 MTH FU--STC   Vital Signs:  Patient profile:   55 year old female Height:      62 inches Weight:      258 pounds BMI:     47.36 O2 Sat:      97 % on Room air Temp:     98.6 degrees F oral Pulse rate:   95 / minute Pulse rhythm:   regular Resp:     16 per minute BP sitting:   126 / 80  (left arm) Cuff size:   large  Vitals Entered By: Rock Nephew CMA (August 16, 2009 8:22 AM)  Nutrition Counseling: Patient's BMI is greater than 25 and therefore counseled on weight management options.  O2 Flow:  Room air  Primary Care Provider:  Etta Grandchild MD   History of Present Illness: She returns for f/up and says that she did not tolerate Chantix and she continues to smoke and gain weight though she is more active recently with no CP or DOE. She canceled her colonoscopy due to fear.  Preventive Screening-Counseling & Management  Alcohol-Tobacco     Alcohol drinks/day: 0     Smoking Status: current     Smoking Cessation Counseling: yes     Smoke Cessation Stage: precontemplative     Packs/Day: 1     Year Started: 1968     Cans of tobacco/week: no     Passive Smoke Exposure: yes  Hep-HIV-STD-Contraception     HIV Risk: no  Clinical Review Panels:  Lipid Management   Cholesterol:  203 (06/01/2008)   LDL (bad choesterol):  DEL (06/01/2008)   HDL (good cholesterol):  76.6 (06/01/2008)  Complete Metabolic Panel   Glucose:  110 (03/02/2009)   Sodium:  142 (03/02/2009)   Potassium:  3.7 (03/02/2009)   Chloride:  110 (03/02/2009)   CO2:  28 (03/02/2009)   BUN:  14 (03/02/2009)   Creatinine:  0.7 (03/02/2009)   Albumin:  3.6 (03/02/2009)   Total Protein:  7.8 (03/02/2009)   Calcium:  9.3 (03/02/2009)   Total Bili:  0.4 (03/02/2009)   Alk Phos:  96 (03/02/2009)   SGPT (ALT):  16 (03/02/2009)   SGOT (AST):  17 (03/02/2009)   Current Medications (verified): 1)  Effexor Xr 75 Mg Xr24h-Cap (Venlafaxine Hcl) .... 2am and 1qhs 2)  Ibuprofen 800 Mg Tabs  (Ibuprofen) .... One By Mouth Three Times A Day As Needed Pain 3)  Topiramate 100 Mg Tabs (Topiramate) .... One Po At Bedtime 4)  Ambien 10 Mg Tabs (Zolpidem Tartrate) .... Take 1 Tab By Mouth At Bedtime 5)  Excedrin Migraine 250-250-65 Mg Tabs (Aspirin-Acetaminophen-Caffeine) .... As Needed 6)  Ventolin Hfa 108 (90 Base) Mcg/act Aers (Albuterol Sulfate) .Marland Kitchen.. 1-2 Puffs Qid Prn  Allergies (verified): 1)  ! * Chantix  Past History:  Past Medical History: Reviewed history from 03/02/2009 and no changes required. COPD Depression GERD Headache Hyperlipidemia Allergic rhinitis Chronic B serous otitis 2010 Asthma  Past Surgical History: Reviewed history from 06/23/2008 and no changes required. CTS release bilaterally Cholecystectomy 1991 Tonsillectomy Knee Arthroscopy x2  Family History: Reviewed history from 06/01/2008 and no changes required. Adopted  Social History: Reviewed history from 06/01/2008 and no changes required. Divorced Current Smoker Drug use-no Regular exercise-no Disabled due to depression  Review of Systems       The patient complains of weight gain.  The patient denies chest pain, dyspnea on exertion, peripheral edema, prolonged cough, headaches, hemoptysis, abdominal pain, difficulty walking, depression, and  enlarged lymph nodes.   Heme:  Denies abnormal bruising, bleeding, enlarge lymph nodes, fevers, pallor, and skin discoloration.  Physical Exam  General:  alert, well-developed, well-nourished, well-hydrated, and overweight-appearing.   Mouth:  Oral mucosa and oropharynx without lesions or exudates.  Teeth in good repair. Neck:  supple, full ROM, no masses, no cervical lymphadenopathy, and no neck tenderness.   Lungs:  Normal respiratory effort, chest expands symmetrically. Lungs are clear to auscultation, no crackles or wheezes. Heart:  Normal rate and regular rhythm. S1 and S2 normal without gallop, murmur, click, rub or other extra  sounds. Abdomen:  Bowel sounds positive,abdomen soft and non-tender without masses, organomegaly or hernias noted. Msk:  normal ROM, no joint tenderness, and no joint swelling.   Pulses:  R and L carotid,radial,femoral,dorsalis pedis and posterior tibial pulses are full and equal bilaterally Extremities:  No clubbing, cyanosis, edema, or deformity noted with normal full range of motion of all joints.   Neurologic:  No cranial nerve deficits noted. Station and gait are normal. Plantar reflexes are down-going bilaterally. DTRs are symmetrical throughout. Sensory, motor and coordinative functions appear intact. Skin:  Intact without suspicious lesions or rashes Psych:  Cognition and judgment appear intact. Alert and cooperative with normal attention span and concentration. No apparent delusions, illusions, hallucinations   Impression & Recommendations:  Problem # 1:  B12 DEFICIENCY (ICD-266.2) Assessment Unchanged  Orders: Tobacco use cessation intermediate 3-10 minutes ([address])  Problem # 2:  TOBACCO USE (ICD-305.1) Assessment: Unchanged  The following medications were removed from the medication list:    Chantix Starting Month Pak 0.5 Mg X 11 & 1 Mg X 42 Tabs (Varenicline tartrate) .Marland Kitchen... Take as directed  Encouraged smoking cessation and discussed different methods for smoking cessation.   Orders: Tobacco use cessation intermediate 3-10 minutes ([address])  Problem # 3:  MORBID OBESITY (ICD-278.01) Assessment: Deteriorated  Ht: 62 (08/16/2009)   Wt: 258 (08/16/2009)   BMI: 47.36 (08/16/2009)  Orders: Tobacco use cessation intermediate 3-10 minutes ([address]406)  Problem # 4:  COPD (ICD-496) Assessment: Unchanged  Her updated medication list for this problem includes:    Ventolin Hfa 108 (90 Base) Mcg/act Aers (Albuterol sulfate) .Marland Kitchen... 1-2 puffs qid prn  Pulmonary Functions Reviewed: O2 sat: 97 (08/16/2009)     Vaccines Reviewed: Pneumovax: Pneumovax (04/19/2009)   Flu Vax: Fluvax  3+ (04/19/2009)  Orders: Tobacco use cessation intermediate 3-10 minutes (99406)  Complete Medication List: 1)  Effexor Xr 75 Mg Xr24h-cap (Venlafaxine hcl) .... 2am and 1qhs 2)  Ibuprofen 800 Mg Tabs (Ibuprofen) .... One by mouth three times a day as needed pain 3)  Topiramate 100 Mg Tabs (Topiramate) .... One po at bedtime 4)  Ambien 10 Mg Tabs (Zolpidem tartrate) .... Take 1 tab by mouth at bedtime 5)  Excedrin Migraine 250-250-65 Mg Tabs (Aspirin-acetaminophen-caffeine) .... As needed 6)  Ventolin Hfa 108 (90 Base) Mcg/act Aers (Albuterol sulfate) .Marland Kitchen.. 1-2 puffs qid prn  Colorectal Screening:  Current Recommendations:    Colonoscopy recommended: patient defers today but will consider in the future  Osteoporosis Risk Assessment:  Risk Factors for Fracture or Low Bone Density:   Smoking status:       current  Immunization & Chemoprophylaxis:    Influenza vaccine: Fluvax 3+  (04/19/2009)    Pneumovax: Pneumovax  (04/19/2009)  Patient Instructions: 1)  Please schedule a follow-up appointment in 1 month. 2)  Tobacco is very bad for your health and your loved ones! You Should stop smoking!. 3)  Stop Smoking  Tips: Choose a Quit date. Cut down before the Quit date. decide what you will do as a substitute when you feel the urge to smoke(gum,toothpick,exercise). 4)  It is important that you exercise regularly at least 20 minutes 5 times a week. If you develop chest pain, have severe difficulty breathing, or feel very tired , stop exercising immediately and seek medical attention. 5)  You need to lose weight. Consider a lower calorie diet and regular exercise.    Medication Administration  Injection # 1:    Medication: Vit B12 1000 mcg    Diagnosis: B12 DEFICIENCY (ICD-266.2)    Route: IM    Exp Date: 05/2011    Lot #: [id number]    Mfr: [employer]    Patient tolerated injection without complications    Given by: Rock Nephew CMA (August 16, 2009 8:29 AM)  Orders Added: 1)   Est. Patient Level III [51761] 2)  Tobacco use cessation intermediate 3-10 minutes [99406]

## 2010-08-09 NOTE — Letter (Signed)
Summary: Patient Notice- Colon Biospy Results   Gastroenterology  [address] [place], [place] [address]   Phone: [phone]  Fax: [phone]        January 21, 2010 MRN: [id number]    [facility] [address] [place], [place]  [address]    Dear Ms. Alguire,  I am pleased to inform you that the biopsies taken during your recent colonoscopy did not show any evidence of cancer upon pathologic examination.  Additional information/recommendations:  __No further action is needed at this time.  Please follow-up with      your primary care physician for your other healthcare needs.  __Please call [phone] to schedule a return visit to review      your condition.  __Continue with the treatment plan as outlined on the day of your      exam.  _x_You should have a repeat colonoscopy examination for this problem           in 10_ years.  Please call us if you are having persistent problems or have questions about your condition that have not been fully answered at this time.  Sincerely,  Mardella Layman MD [facility]   This letter has been electronically signed by your physician.  Appended Document: Patient Notice- Colon Biospy Results letter mailed.

## 2010-08-09 NOTE — Miscellaneous (Signed)
Summary: Orders Update pft charges  Clinical Lists Changes  Orders: Added new Service order of Carbon Monoxide diffusing w/capacity (94720) - Signed Added new Service order of Lung Volumes (94240) - Signed Added new Service order of Spirometry (Pre & Post) (94060) - Signed 

## 2010-08-09 NOTE — Assessment & Plan Note (Signed)
Summary: 4 MTH FU  STC   Vital Signs:  Patient profile:   55 year old female Height:      62 inches Weight:      264 pounds BMI:     48.46 O2 Sat:      97 % on Room air Temp:     98.8 degrees F oral Pulse rate:   80 / minute Pulse rhythm:   regular Resp:     16 per minute BP sitting:   114 / 72  (left arm) Cuff size:   large  Vitals Entered By: Rock Nephew CMA (March 24, 2010 8:44 AM)  Nutrition Counseling: Patient's BMI is greater than 25 and therefore counseled on weight management options.  O2 Flow:  Room air CC: follow-up visit  Does patient need assistance? Functional Status Self care Ambulation Normal   Primary Care Provider:  Etta Grandchild, MD  CC:  follow-up visit.  History of Present Illness: She returns c/o swelling around right eye. She was seen 2 days ago when her right eye was red and she started drops but now she has swelling. The eye is no longer red and she has no photophobia, loss of vision, HA, rash, LAD, or N/V.  Preventive Screening-Counseling & Management  Alcohol-Tobacco     Alcohol drinks/day: 0     Smoking Status: current     Smoking Cessation Counseling: yes     Smoke Cessation Stage: precontemplative     Packs/Day: 1     Year Started: 1966     Cans of tobacco/week: no     Passive Smoke Exposure: yes     Tobacco Counseling: to quit use of tobacco products  Hep-HIV-STD-Contraception     Hepatitis Risk: no risk noted     HIV Risk: no     STD Risk: no risk noted  Current Medications (verified): 1)  Prozac 20 Mg Caps (Fluoxetine Hcl) .... Take 1 Tablet By Mouth Bid 2)  Ibuprofen 800 Mg Tabs (Ibuprofen) .... One By Mouth Three Times A Day As Needed Pain 3)  Excedrin Migraine 250-250-65 Mg Tabs (Aspirin-Acetaminophen-Caffeine) .... As Needed 4)  Ventolin Hfa 108 (90 Base) Mcg/act Aers (Albuterol Sulfate) .Marland Kitchen.. 1-2 Puffs Qid Prn 5)  Ativan 1 Mg Tabs (Lorazepam) .... One By Mouth Three Times A Day For Anxiety 6)  Spiriva Handihaler  18 Mcg Caps (Tiotropium Bromide Monohydrate) .... One Puff Once Daily 7)  Dexilant 60 Mg Cpdr (Dexlansoprazole) .... One By Mouth Once Daily For Heartburn 8)  Pepto-Bismol 262 Mg/75ml Susp (Bismuth Subsalicylate) .... Will Use Up To Three Times Daily 9)  Tums 500 Mg Chew (Calcium Carbonate Antacid) .... As Needed 10)  Colestid 1 Gm Tabs (Colestipol Hcl) .Marland Kitchen.. 1 Tab Mid Morning For Dairrhea,  May Increase Up To 4 Tabs Per Day If Needed. 11)  Folic Acid 1 Mg Tabs (Folic Acid) .Marland Kitchen.. 1 By Mouth Once Daily 12)  Maxitrol 3.5-10000-0.1 Susp (Neomycin-Polymyxin-Dexameth) .Marland Kitchen.. 1 Gtt in R Eye Qid  Allergies (verified): 1)  ! * Chantix 2)  ! Ambien  Past History:  Past Medical History: Last updated: 11/24/2009 COPD Depression GERD Headache Hyperlipidemia Allergic rhinitis Asthma  Past Surgical History: Last updated: 06/23/2008 CTS release bilaterally Cholecystectomy 1991 Tonsillectomy Knee Arthroscopy x2  Family History: Last updated: 06/01/2008 Adopted  Social History: Last updated: 06/01/2008 Divorced Current Smoker Drug use-no Regular exercise-no Disabled due to depression  Risk Factors: Alcohol Use: 0 (03/24/2010) Caffeine Use: 2 (06/23/2008) Exercise: no (06/01/2008)  Risk Factors: Smoking Status:  current (03/24/2010) Packs/Day: 1 (03/24/2010) Cans of tobacco/wk: no (03/24/2010) Passive Smoke Exposure: yes (03/24/2010)  Family History: Reviewed history from 06/01/2008 and no changes required. Adopted  Social History: Reviewed history from 06/01/2008 and no changes required. Divorced Current Smoker Drug use-no Regular exercise-no Disabled due to depression  Review of Systems  The patient denies anorexia, fever, weight loss, chest pain, syncope, dyspnea on exertion, peripheral edema, prolonged cough, headaches, hemoptysis, abdominal pain, suspicious skin lesions, enlarged lymph nodes, and angioedema.   General:  Denies chills, fatigue, fever, loss of  appetite, malaise, sleep disorder, and sweats. Eyes:  Denies blurring, discharge, double vision, eye irritation, eye pain, itching, light sensitivity, red eye, and vision loss-both eyes.  Physical Exam  General:  alert, well-developed, well-nourished, well-hydrated, and overweight-appearing.   Head:  normocephalic, atraumatic, no abnormalities observed, and no abnormalities palpated.   Eyes:  she has mild right infra-orbital swelling but no ptosis or proptosis. vision grossly intact, pupils equal, pupils round, pupils reactive to light, pupils react to accomodation, no injection, and no nystagmus.   Ears:  R ear normal and L ear normal.   Mouth:  Oral mucosa and oropharynx without lesions or exudates.  Teeth in good repair. Neck:  supple, full ROM, no masses, no cervical lymphadenopathy, and no neck tenderness.   Lungs:  normal respiratory effort, no intercostal retractions, no accessory muscle use, normal breath sounds, no dullness, no fremitus, no crackles, and no wheezes.   Heart:  normal rate, regular rhythm, no murmur, no gallop, no rub, and no JVD.   Abdomen:  soft, non-tender, normal bowel sounds, no distention, no masses, no guarding, no rigidity, no rebound tenderness, no hepatomegaly, and no splenomegaly.   Msk:  normal ROM, no joint tenderness, no joint swelling, no joint warmth, no redness over joints, no joint deformities, no joint instability, and no crepitation.   Pulses:  R and L carotid,radial,femoral,dorsalis pedis and posterior tibial pulses are full and equal bilaterally Extremities:  No clubbing, cyanosis, edema, or deformity noted with normal full range of motion of all joints.   Neurologic:  No cranial nerve deficits noted. Station and gait are normal. Plantar reflexes are down-going bilaterally. DTRs are symmetrical throughout. Sensory, motor and coordinative functions appear intact. Skin:  turgor normal, color normal, no rashes, no suspicious lesions, no ecchymoses, no  petechiae, no purpura, no ulcerations, and no edema.   Cervical Nodes:  no anterior cervical adenopathy and no posterior cervical adenopathy.   Axillary Nodes:  no R axillary adenopathy and no L axillary adenopathy.   Inguinal Nodes:  no R inguinal adenopathy and no L inguinal adenopathy.   Psych:  Cognition and judgment appear intact. Alert and cooperative with normal attention span and concentration. No apparent delusions, illusions, hallucinations   Impression & Recommendations:  Problem # 1:  PERIORBITAL CELLULITIS (ICD-376.01) Assessment New start Ceftin  Problem # 2:  ELEVATED BLOOD PRESSURE (ICD-796.2) Assessment: Improved  BP today: 114/72 Prior BP: 128/76 (02/22/2010)  Prior 10 Yr Risk Heart Disease: 6 % (06/16/2008)  Labs Reviewed: Creat: 0.9 (02/22/2010) Chol: 244 (10/20/2009)   HDL: 72.00 (10/20/2009)   LDL: DEL (06/01/2008)   TG: 89.0 (10/20/2009)  Instructed in low sodium diet (DASH Handout) and behavior modification.    Problem # 3:  TOBACCO USE (ICD-305.1) Assessment: Unchanged  Encouraged smoking cessation and discussed different methods for smoking cessation.   Complete Medication List: 1)  Prozac 20 Mg Caps (Fluoxetine hcl) .... Take 1 tablet by mouth bid 2)  Ibuprofen 800  Mg Tabs (Ibuprofen) .... One by mouth three times a day as needed pain 3)  Excedrin Migraine 250-250-65 Mg Tabs (Aspirin-acetaminophen-caffeine) .... As needed 4)  Ventolin Hfa 108 (90 Base) Mcg/act Aers (Albuterol sulfate) .Marland Kitchen.. 1-2 puffs qid prn 5)  Ativan 1 Mg Tabs (Lorazepam) .... One by mouth three times a day for anxiety 6)  Spiriva Handihaler 18 Mcg Caps (Tiotropium bromide monohydrate) .... One puff once daily 7)  Dexilant 60 Mg Cpdr (Dexlansoprazole) .... One by mouth once daily for heartburn 8)  Pepto-bismol 262 Mg/2ml Susp (Bismuth subsalicylate) .... Will use up to three times daily 9)  Tums 500 Mg Chew (Calcium carbonate antacid) .... As needed 10)  Colestid 1 Gm Tabs  (Colestipol hcl) .Marland Kitchen.. 1 tab mid morning for dairrhea,  may increase up to 4 tabs per day if needed. 11)  Folic Acid 1 Mg Tabs (Folic acid) .Marland Kitchen.. 1 by mouth once daily 12)  Maxitrol 3.5-10000-0.1 Susp (Neomycin-polymyxin-dexameth) .Marland Kitchen.. 1 gtt in r eye qid 13)  Ceftin 500 Mg Tab (Cefuroxime axetil) .... Take one (1) tablet by mouth two (2) times a day x 10 days  Patient Instructions: 1)  Please schedule a follow-up appointment in 2 weeks. 2)  Acute sinusitis symptoms for less than 10 days are not helped by antibiotics.Use warm moist compresses, and over the counter decongestants ( only as directed). Call if no improvement in 5-7 days, sooner if increasing pain, fever, or new symptoms. 3)  Clean any discharge from eyelids with baby shampoo and warm water. Be sure to wash your hands often  to avoid spreading and reinfection. If you wear contacts, remove them and wear glasses until infection resolved (be sure and clean lenses before replacing).  Prescriptions: CEFTIN 500 MG TAB (CEFUROXIME AXETIL) Take one (1) tablet by mouth two (2) times a day X 10 days  #20 x 1   Entered and Authorized by:   Etta Grandchild MD   Signed by:   Etta Grandchild MD on 03/24/2010   Method used:   Electronically to        [facility]  [address] [phone]* (retail)       [address]       [place], [place]  [address]       Ph: [phone] or [phone]       Fax: [phone]   RxID:   [id number]

## 2010-08-09 NOTE — Assessment & Plan Note (Signed)
Summary: Post proc/dfs   History of Present Illness Visit Type: Follow-up Visit Primary GI MD: Sheryn Bison MD FACP FAGA Primary Provider: Etta Grandchild, MD Requesting Provider: na Chief Complaint: F/u from colon. Pt c/o diarrhea  History of Present Illness:   obese 55 year old [place] American female who recently underwent normal screening colonoscopy including colon biopsies. She complains of worsening diarrhea with known lactose intolerance. She denies rectal bleeding or systemic complaints. Extensive chart review shows that her diarrhea has been present for several years. Previous workups have been negative except for evidence of B12 deficiency, she is not on replacement therapy at this time. She gives no history of hepatitis, pancreatitis, or any symptoms of malabsorption is set for vitamin D and calcium deficiency. She is status post cholecystectomy.  She has 4-5 loose balance today and does complain of some nocturnal diarrhea. No systemic complaints or upper GI problems. She does take p.r.n. ibuprofen, Excedrin migraine, is currently on Dexilant 60 mg a day for GERD.   GI Review of Systems      Denies abdominal pain, acid reflux, belching, bloating, chest pain, dysphagia with liquids, dysphagia with solids, heartburn, loss of appetite, nausea, vomiting, vomiting blood, weight loss, and  weight gain.      Reports diarrhea.     Denies anal fissure, black tarry stools, change in bowel habit, constipation, diverticulosis, fecal incontinence, heme positive stool, hemorrhoids, irritable bowel syndrome, jaundice, light color stool, liver problems, rectal bleeding, and  rectal pain.    Current Medications (verified): 1)  Prozac 20 Mg Caps (Fluoxetine Hcl) .... Take 1 Tablet By Mouth Every Morning 2)  Ibuprofen 800 Mg Tabs (Ibuprofen) .... One By Mouth Three Times A Day As Needed Pain 3)  Excedrin Migraine 250-250-65 Mg Tabs (Aspirin-Acetaminophen-Caffeine) .... As Needed 4)  Ventolin  Hfa 108 (90 Base) Mcg/act Aers (Albuterol Sulfate) .Marland Kitchen.. 1-2 Puffs Qid Prn 5)  Ativan 1 Mg Tabs (Lorazepam) .... One By Mouth Three Times A Day For Anxiety 6)  Spiriva Handihaler 18 Mcg Caps (Tiotropium Bromide Monohydrate) .... One Puff Once Daily 7)  Dexilant 60 Mg Cpdr (Dexlansoprazole) .... One By Mouth Once Daily For Heartburn 8)  Pepto-Bismol 262 Mg/14ml Susp (Bismuth Subsalicylate) .... Will Use Up To Three Times Daily 9)  Tums 500 Mg Chew (Calcium Carbonate Antacid) .... As Needed  Allergies (verified): 1)  ! * Chantix 2)  ! Ambien  Past History:  Past medical, surgical, family and social histories (including risk factors) reviewed for relevance to current acute and chronic problems.  Past Medical History: Reviewed history from 11/24/2009 and no changes required. COPD Depression GERD Headache Hyperlipidemia Allergic rhinitis Asthma  Past Surgical History: Reviewed history from 06/23/2008 and no changes required. CTS release bilaterally Cholecystectomy 1991 Tonsillectomy Knee Arthroscopy x2  Family History: Reviewed history from 06/01/2008 and no changes required. Adopted  Social History: Reviewed history from 06/01/2008 and no changes required. Divorced Current Smoker Drug use-no Regular exercise-no Disabled due to depression  Review of Systems       The patient complains of arthritis/joint pain, back pain, and fatigue.  The patient denies allergy/sinus, anemia, anxiety-new, blood in urine, breast changes/lumps, change in vision, confusion, cough, coughing up blood, depression-new, fainting, fever, headaches-new, hearing problems, heart murmur, heart rhythm changes, itching, menstrual pain, muscle pains/cramps, night sweats, nosebleeds, pregnancy symptoms, shortness of breath, skin rash, sleeping problems, sore throat, swelling of feet/legs, swollen lymph glands, thirst - excessive, urination - excessive, urination changes/pain, urine leakage, vision changes, and  voice change.  Vital Signs:  Patient profile:   55 year old female Height:      62 inches Weight:      259 pounds BMI:     47.54 BSA:     2.14 Pulse rate:   88 / minute Pulse rhythm:   regular BP sitting:   128 / 76  (left arm) Cuff size:   large  Vitals Entered By: Ok Anis CMA (February 22, 2010 8:35 AM)  Physical Exam  General:  Well developed, well nourished, no acute distress.healthy appearing.   Head:  Normocephalic and atraumatic. Eyes:  PERRLA, no icterus. Lungs:  Clear throughout to auscultation. Heart:  Regular rate and rhythm; no murmurs, rubs,  or bruits. Abdomen:  Soft, nontender and nondistended. No masses, hepatosplenomegaly or hernias noted. Normal bowel sounds.obese.   Msk:  Symmetrical with no gross deformities. Normal posture. Extremities:  No clubbing, cyanosis, edema or deformities noted. Neurologic:  Alert and  oriented x4;  grossly normal neurologically. Psych:  Alert and cooperative. Normal mood and affect.   Impression & Recommendations:  Problem # 1:  DIARRHEA (ICD-787.91) Assessment Unchanged Probable lactose intolerance with excessive use of sorbitol in breath mints that she consumes regularly. Stool exams have been ordered and labs to exclude malabsorption. I placed her also on Colestid 1 g a day to be just as needed for her bowel movement pattern. 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 Repeat B12 levels and sprue antibodies also been ordered. TLB-CBC Platelet - w/Differential (85025-CBCD) TLB-BMP (Basic Metabolic Panel-BMET) (80048-METABOL) TLB-Hepatic/Liver Function Pnl (80076-HEPATIC) TLB-TSH (Thyroid Stimulating Hormone) (84443-TSH) TLB-B12, Serum-Total ONLY (29562-Z30) TLB-Ferritin (82728-FER) TLB-Folic Acid (Folate) (82746-FOL) TLB-IBC Pnl (Iron/FE;Transferrin) (83550-IBC) TLB-IgA (Immunoglobulin A) (82784-IGA) T-Sprue Panel (Celiac Disease Aby Eval) (83516x3/[address]) T-Culture, Stool  (87045/87046-70140) T-Stool Giardia / Crypto- EIA (86578) T-Stool for O&P (46962-95284) T-Fecal WBC (13244-01027) T- * Misc. Laboratory test [phone])  Problem # 2:  B12 DEFICIENCY (ICD-266.2) Assessment: Unchanged  Problem # 3:  GERD (ICD-530.81) Assessment: Improved continue reflux regime and Dexilant 60 mg a day.  Problem # 4:  MORBID OBESITY (ICD-278.01) Assessment: Unchanged  Problem # 5:  DEPRESSION (ICD-311) Assessment: Improved Continue Prozac 20 mg a day.  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  Patient Instructions: 1)  Please go to the basement for lab work. 2)  Begin Colestid one tab mid morning.  You may increase this up to 4 tabs per day if needed. 3)  Excessive Gas Diet handout given.  4)  The medication list was reviewed and reconciled.  All changed / newly prescribed medications were explained.  A complete medication list was provided to the patient / caregiver. 5)  stool exams ordered 6)  Remove sorbitol fructose from diet 7)  Avoid foods high in acid content ( tomatoes, citrus juices, spicy foods) . Avoid eating within 3 to 4 hours of lying down or before exercising. Do not over eat; try smaller more frequent meals. Elevate head of bed four inches when sleeping.  8)  Lactose Intolerance brochure given.  9)  Copy sent to : Dr. Sanda Linger.  Appended Document: Post proc/dfs    Clinical Lists Changes  Medications: Added new medication of COLESTID 1 GM TABS (COLESTIPOL HCL) 1 tab mid morning for dairrhea,  May increase up to 4 tabs per day if needed. - Signed Rx of COLESTID 1 GM TABS (COLESTIPOL HCL) 1 tab mid morning for dairrhea,  May increase up to 4 tabs per day if needed.;  #60 x 6;  Signed;  Entered by: Ashok Cordia RN;  Authorized by: Mardella Layman MD [facility];  Method used: [facility]ically [facility]  [address] [phone]*, [address],  [place], [place]  [address], Ph: [phone] or [phone], Fax: [phone]    Prescriptions: COLESTID 1 GM TABS (COLESTIPOL HCL) 1 tab mid morning for dairrhea,  May increase up to 4 tabs per day if needed.  #60 x 6   Entered by:   Ashok Cordia RN   Authorized by:   Mardella Layman MD [facility]   Signed by:   Ashok Cordia RN on 02/22/2010   Method used:   [facility]        [facility] [address] [phone]* (retail)       [address]       [place], [place]  [address]       Ph: [phone] or [phone]       Fax: [phone]   RxID:   [id number]

## 2010-08-09 NOTE — Progress Notes (Signed)
Summary: Follow up appt  Phone Note Outgoing Call   Call placed by: Ashok Cordia RN,  January 20, 2010 4:39 PM Summary of Call: Pt needs follow up appt in one month.  Pt not available this pm.  Call back tomorrow. Initial call taken by: Ashok Cordia RN,  January 20, 2010 4:40 PM  Follow-up for Phone Call        Return appt sch. Follow-up by: Ashok Cordia RN,  January 21, 2010 12:40 PM

## 2010-08-09 NOTE — Progress Notes (Signed)
  Phone Note Call from Patient Call back at Work Phone [phone]   Summary of Call: Pt has c/o possble "pink eye" and elevated bp. Needs eval today with avail MD. Please help schedule. Thanks Initial call taken by: Lamar Sprinkles, CMA,  March 22, 2010 12:04 PM  Follow-up for Phone Call        Appt Dr Posey Rea at 1:30 appt today. Follow-up by: Verdell Face,  March 22, 2010 12:10 PM

## 2010-08-09 NOTE — Letter (Signed)
Summary: Primary Care Consult Scheduled Letter  [facility]  [address] [place], [place] [address]   Phone: [phone]  Fax: [phone]      11/04/2009 MRN: [id number]  [facility] [address] [place], [place]  [address]    Dear Ms. Benedict,      We have scheduled an appointment for you.  At the recommendation of Dr.Jones we have scheduled you a consult with [facility] on Thur BMW41,3244 at 4:00pm. Their address is [address]  . The offices phone number is [phone]. If this appointment day and time is not convenient for you, please feel free to call the office of the doctor you are being referred to at the number listed above and reschedule the appointment.     It is important for you to keep your scheduled appointments. We are here to make sure you are given good patient care. If you have questions or you have made changes to your appointment, please notify us at  [phone]         , ask for  Debra            .    Thank you,  Patient Care Coordinator [facility]

## 2010-08-09 NOTE — Assessment & Plan Note (Signed)
Summary: elev bp,pink eye/jones/cd   Vital Signs:  Patient profile:   55 year old female Height:      62 inches Weight:      263 pounds BMI:     48.28 Temp:     99.5 degrees F oral Pulse rate:   76 / minute Pulse rhythm:   regular Resp:     16 per minute BP sitting:   120 / 78  (left arm) Cuff size:   large  Vitals Entered By: Lanier Prude, CMA(AAMA) (March 22, 2010 1:19 PM) CC: elevated BP, Ry eye swollen/red X 3 wks Is Patient Diabetic? No   Primary Care Provider:  Etta Grandchild, MD  CC:  elevated BP and Ry eye swollen/red X 3 wks.  History of Present Illness: C/o elev BP over the wknd - she went to ER on Sun C/o stress and anxiety - getting married, school on line C/o R  bag  under eye x 3 wks; R eye turned red 3 wks ago  Current Medications (verified): 1)  Prozac 20 Mg Caps (Fluoxetine Hcl) .... Take 1 Tablet By Mouth Every Morning 2)  Ibuprofen 800 Mg Tabs (Ibuprofen) .... One By Mouth Three Times A Day As Needed Pain 3)  Excedrin Migraine 250-250-65 Mg Tabs (Aspirin-Acetaminophen-Caffeine) .... As Needed 4)  Ventolin Hfa 108 (90 Base) Mcg/act Aers (Albuterol Sulfate) .Marland Kitchen.. 1-2 Puffs Qid Prn 5)  Ativan 1 Mg Tabs (Lorazepam) .... One By Mouth Three Times A Day For Anxiety 6)  Spiriva Handihaler 18 Mcg Caps (Tiotropium Bromide Monohydrate) .... One Puff Once Daily 7)  Dexilant 60 Mg Cpdr (Dexlansoprazole) .... One By Mouth Once Daily For Heartburn 8)  Pepto-Bismol 262 Mg/59ml Susp (Bismuth Subsalicylate) .... Will Use Up To Three Times Daily 9)  Tums 500 Mg Chew (Calcium Carbonate Antacid) .... As Needed 10)  Colestid 1 Gm Tabs (Colestipol Hcl) .Marland Kitchen.. 1 Tab Mid Morning For Dairrhea,  May Increase Up To 4 Tabs Per Day If Needed. 11)  Folic Acid 1 Mg Tabs (Folic Acid) .Marland Kitchen.. 1 By Mouth Once Daily  Allergies (verified): 1)  ! * Chantix 2)  ! Ambien  Past History:  Past Medical History: Last updated:  11/24/2009 COPD Depression GERD Headache Hyperlipidemia Allergic rhinitis Asthma  Past Surgical History: Last updated: 06/23/2008 CTS release bilaterally Cholecystectomy 1991 Tonsillectomy Knee Arthroscopy x2  Social History: Last updated: 06/01/2008 Divorced Current Smoker Drug use-no Regular exercise-no Disabled due to depression  Review of Systems  The patient denies anorexia and fever.    Physical Exam  General:  alert, well-developed, well-nourished, well-hydrated, and overweight-appearing.   Eyes:  R eye w/mild erythema in conjunciva, NT pressure OK by palpation Periorbital edema is present Ears:  WNL Nose:  WNL Mouth:  Oral mucosa and oropharynx without lesions or exudates.  Teeth in good repair. Lungs:  normal respiratory effort, no intercostal retractions, no accessory muscle use, normal breath sounds, no dullness, no fremitus, no crackles, and no wheezes.   Heart:  normal rate, regular rhythm, no murmur, no gallop, no rub, and no JVD.   Abdomen:  soft, non-tender, normal bowel sounds, no distention, no masses, no guarding, no rigidity, no rebound tenderness, no hepatomegaly, and no splenomegaly.   Skin:  turgor normal, color normal, no rashes, no suspicious lesions, no ecchymoses, no petechiae, no purpura, no ulcerations, and no edema.   Psych:  Cognition and judgment appear intact. Alert and cooperative with normal attention span and concentration. No apparent delusions, illusions, hallucinations  Impression & Recommendations:  Problem # 1:  EYE PAIN, RIGHT (ICD-379.91) - very mild; focal edema around L eye Assessment New Maxitrol eye gtt for possible conjunctivitis See "Patient Instructions".   Problem # 2:  STRESS DISORDER (ICD-V62.89) Assessment: New Discussed. See #3  Problem # 3:  ELEVATED BLOOD PRESSURE (ICD-796.2) Assessment: Comment Only Nl BP now The labs were reviewed with the patient.   Problem # 4:  DEPRESSION (ICD-311) Assessment:  Deteriorated  Her updated medication list for this problem includes:    Prozac 20 Mg Caps (Fluoxetine hcl) .Marland Kitchen... Take 1 tablet by mouth bid    Ativan 1 Mg Tabs (Lorazepam) ..... One by mouth three times a day for anxiety  Complete Medication List: 1)  Prozac 20 Mg Caps (Fluoxetine hcl) .... Take 1 tablet by mouth bid 2)  Ibuprofen 800 Mg Tabs (Ibuprofen) .... One by mouth three times a day as needed pain 3)  Excedrin Migraine 250-250-65 Mg Tabs (Aspirin-acetaminophen-caffeine) .... As needed 4)  Ventolin Hfa 108 (90 Base) Mcg/act Aers (Albuterol sulfate) .Marland Kitchen.. 1-2 puffs qid prn 5)  Ativan 1 Mg Tabs (Lorazepam) .... One by mouth three times a day for anxiety 6)  Spiriva Handihaler 18 Mcg Caps (Tiotropium bromide monohydrate) .... One puff once daily 7)  Dexilant 60 Mg Cpdr (Dexlansoprazole) .... One by mouth once daily for heartburn 8)  Pepto-bismol 262 Mg/69ml Susp (Bismuth subsalicylate) .... Will use up to three times daily 9)  Tums 500 Mg Chew (Calcium carbonate antacid) .... As needed 10)  Colestid 1 Gm Tabs (Colestipol hcl) .Marland Kitchen.. 1 tab mid morning for dairrhea,  may increase up to 4 tabs per day if needed. 11)  Folic Acid 1 Mg Tabs (Folic acid) .Marland Kitchen.. 1 by mouth once daily 12)  Maxitrol 3.5-10000-0.1 Susp (Neomycin-polymyxin-dexameth) .Marland Kitchen.. 1 gtt in r eye qid 13)  Ceftin 500 Mg Tab (Cefuroxime axetil) .... Take one (1) tablet by mouth two (2) times a day x 10 days  Patient Instructions: 1)  Please schedule a follow-up appointment in 2 weeks Dr Yetta Barre 2)  If eye is not well in 2-3 d - call we will move your Opth appt up. 3)  Can take Prozac 1 bid Prescriptions: PROZAC 20 MG CAPS (FLUOXETINE HCL) Take 1 tablet by mouth bid  #60 x 6   Entered and Authorized by:   Tresa Garter MD   Signed by:   Tresa Garter MD on 03/22/2010   Method used:   [facility]        [facility] [address] [phone]* (retail)       [address]       [place], [place]  [address]       Ph:  [phone] or [phone]       Fax: [phone]   RxID:   [id number] MAXITROL 3.5-10000-0.1 SUSP [facility]) 1 gtt in R eye qid  #1 x 0   Entered and Authorized by:   Tresa Garter MD   Signed by:   Tresa Garter MD on 03/22/2010   Method used:   [facility]        [facility] [address] [phone]* (retail)       [address]       [place], [place]  [address]       Ph: [phone] or [phone]       Fax: [phone]   RxID:   [id number]

## 2010-08-09 NOTE — Assessment & Plan Note (Signed)
Summary: 1 MTH FU--STC   Vital Signs:  Patient profile:   55 year old female Height:      62 inches Weight:      261 pounds BMI:     47.91 O2 Sat:      96 % on Room air Temp:     97.7 degrees F oral Pulse rate:   79 / minute Pulse rhythm:   regular Resp:     16 per minute BP sitting:   120 / 82  (left arm) Cuff size:   large  Vitals Entered By: Bill Salinas CMA (Nov 24, 2009 8:30 AM)  Nutrition Counseling: Patient's BMI is greater than 25 and therefore counseled on weight management options.  O2 Flow:  Room air CC: pt here for one month follow up/ ab Is Patient Diabetic? No Pain Assessment Patient in pain? no        Primary Care Provider:  Etta Grandchild MD  CC:  pt here for one month follow up/ ab.  History of Present Illness: She returns for f/up on recent PFT's that showed a reversible airway narrowing. She feels well on her current inhalers.  Asthma History    Asthma Control Assessment:    Age range: 12+ years    Symptoms: 0-2 days/week    Nighttime Awakenings: 0-2/month    Interferes w/ normal activity: no limitations    SABA use (not for EIB): 0-2 days/week    ATAQ questionnaire: 0    Asthma Control Assessment: Well Controlled   Preventive Screening-Counseling & Management  Alcohol-Tobacco     Alcohol drinks/day: 0     Smoking Status: current     Smoking Cessation Counseling: yes     Smoke Cessation Stage: precontemplative     Packs/Day: 1     Year Started: 1968     Cans of tobacco/week: no     Passive Smoke Exposure: yes  Hep-HIV-STD-Contraception     Hepatitis Risk: no risk noted     HIV Risk: no     STD Risk: no risk noted      Drug Use:  no.    Current Medications (verified): 1)  Prozac 20 Mg Caps (Fluoxetine Hcl) .... Take 1 Tablet By Mouth Every Morning 2)  Ibuprofen 800 Mg Tabs (Ibuprofen) .... One By Mouth Three Times A Day As Needed Pain 3)  Excedrin Migraine 250-250-65 Mg Tabs (Aspirin-Acetaminophen-Caffeine) .... As Needed 4)   Ventolin Hfa 108 (90 Base) Mcg/act Aers (Albuterol Sulfate) .Marland Kitchen.. 1-2 Puffs Qid Prn 5)  Ativan 1 Mg Tabs (Lorazepam) .... One By Mouth Three Times A Day For Anxiety 6)  Spiriva Handihaler 18 Mcg Caps (Tiotropium Bromide Monohydrate) .... One Puff Once Daily 7)  Dexilant 60 Mg Cpdr (Dexlansoprazole) .... One By Mouth Once Daily For Heartburn 8)  Nicotine 21 Mg/24hr Pt24 (Nicotine) .... Use Once Daily As Needed 9)  Silenor 3 Mg Tabs (Doxepin Hcl) .Marland Kitchen.. 1-2 At Bedtime As Needed For Insomnia  Allergies (verified): 1)  ! * Chantix  Past History:  Past Medical History: COPD Depression GERD Headache Hyperlipidemia Allergic rhinitis Asthma  Past Surgical History: Reviewed history from 06/23/2008 and no changes required. CTS release bilaterally Cholecystectomy 1991 Tonsillectomy Knee Arthroscopy x2  Family History: Reviewed history from 06/01/2008 and no changes required. Adopted  Social History: Reviewed history from 06/01/2008 and no changes required. Divorced Current Smoker Drug use-no Regular exercise-no Disabled due to depression  Review of Systems       The patient complains of weight  gain.  The patient denies anorexia, fever, weight loss, chest pain, syncope, dyspnea on exertion, peripheral edema, prolonged cough, headaches, hemoptysis, abdominal pain, suspicious skin lesions, enlarged lymph nodes, and angioedema.   Resp:  Denies chest discomfort, chest pain with inspiration, cough, coughing up blood, excessive snoring, hypersomnolence, pleuritic, shortness of breath, sputum productive, and wheezing.  Physical Exam  General:  alert, well-developed, well-nourished, well-hydrated, and overweight-appearing.   Mouth:  Oral mucosa and oropharynx without lesions or exudates.  Teeth in good repair. Neck:  supple, full ROM, no masses, no cervical lymphadenopathy, and no neck tenderness.   Lungs:  normal respiratory effort, no intercostal retractions, no accessory muscle use,  normal breath sounds, no dullness, no fremitus, no crackles, and no wheezes.   Heart:  normal rate, regular rhythm, no murmur, no gallop, no rub, and no JVD.   Abdomen:  soft, non-tender, normal bowel sounds, no distention, no masses, no guarding, no rigidity, no rebound tenderness, no hepatomegaly, and no splenomegaly.   Msk:  normal ROM, no joint tenderness, no joint swelling, no joint warmth, no redness over joints, no joint deformities, no joint instability, and no crepitation.   Pulses:  R and L carotid,radial,femoral,dorsalis pedis and posterior tibial pulses are full and equal bilaterally Extremities:  No clubbing, cyanosis, edema, or deformity noted with normal full range of motion of all joints.   Neurologic:  No cranial nerve deficits noted. Station and gait are normal. Plantar reflexes are down-going bilaterally. DTRs are symmetrical throughout. Sensory, motor and coordinative functions appear intact. Skin:  turgor normal, color normal, no rashes, no suspicious lesions, no ecchymoses, no petechiae, no purpura, no ulcerations, and no edema.   Psych:  Cognition and judgment appear intact. Alert and cooperative with normal attention span and concentration. No apparent delusions, illusions, hallucinations   Impression & Recommendations:  Problem # 1:  TOBACCO USE (ICD-305.1) Assessment Unchanged  Her updated medication list for this problem includes:    Nicotine 21 Mg/24hr Pt24 (Nicotine) ..... Use once daily as needed  Encouraged smoking cessation and discussed different methods for smoking cessation.   Orders: Tobacco use cessation intermediate 3-10 minutes (99406)  Problem # 2:  ASTHMA (ICD-493.90) Assessment: Improved  Her updated medication list for this problem includes:    Ventolin Hfa 108 (90 Base) Mcg/act Aers (Albuterol sulfate) .Marland Kitchen... 1-2 puffs qid prn    Spiriva Handihaler 18 Mcg Caps (Tiotropium bromide monohydrate) ..... One puff once daily  Orders: Tobacco use  cessation intermediate 3-10 minutes (98119)  Problem # 3:  B12 DEFICIENCY (ICD-266.2) Assessment: Unchanged  Orders: Admin of Therapeutic Inj  intramuscular or subcutaneous (14782) Vit B12 1000 mcg (J3420)  Problem # 4:  COPD (ICD-496) Assessment: Improved  Her updated medication list for this problem includes:    Ventolin Hfa 108 (90 Base) Mcg/act Aers (Albuterol sulfate) .Marland Kitchen... 1-2 puffs qid prn    Spiriva Handihaler 18 Mcg Caps (Tiotropium bromide monohydrate) ..... One puff once daily  Orders: Tobacco use cessation intermediate 3-10 minutes (99406)  Complete Medication List: 1)  Prozac 20 Mg Caps (Fluoxetine hcl) .... Take 1 tablet by mouth every morning 2)  Ibuprofen 800 Mg Tabs (Ibuprofen) .... One by mouth three times a day as needed pain 3)  Excedrin Migraine 250-250-65 Mg Tabs (Aspirin-acetaminophen-caffeine) .... As needed 4)  Ventolin Hfa 108 (90 Base) Mcg/act Aers (Albuterol sulfate) .Marland Kitchen.. 1-2 puffs qid prn 5)  Ativan 1 Mg Tabs (Lorazepam) .... One by mouth three times a day for anxiety 6)  Spiriva Handihaler  18 Mcg Caps (Tiotropium bromide monohydrate) .... One puff once daily 7)  Dexilant 60 Mg Cpdr (Dexlansoprazole) .... One by mouth once daily for heartburn 8)  Nicotine 21 Mg/24hr Pt24 (Nicotine) .... Use once daily as needed 9)  Silenor 3 Mg Tabs (Doxepin hcl) .Marland Kitchen.. 1-2 at bedtime as needed for insomnia  Patient Instructions: 1)  Please schedule a follow-up appointment in 4 months. 2)  Tobacco is very bad for your health and your loved ones! You Should stop smoking!. 3)  Stop Smoking Tips: Choose a Quit date. Cut down before the Quit date. decide what you will do as a substitute when you feel the urge to smoke(gum,toothpick,exercise). 4)  It is important that you exercise regularly at least 20 minutes 5 times a week. If you develop chest pain, have severe difficulty breathing, or feel very tired , stop exercising immediately and seek medical attention. 5)  You need to  lose weight. Consider a lower calorie diet and regular exercise.  Prescriptions: SPIRIVA HANDIHALER 18 MCG CAPS (TIOTROPIUM BROMIDE MONOHYDRATE) One puff once daily  #30 x 11   Entered and Authorized by:   Etta Grandchild MD   Signed by:   Etta Grandchild MD on 11/24/2009   Method used:   [facility]        [facility]  [address] [phone][facility] (retail)       [address]       [place], [place]  [address]       Ph: [phone] or [phone]       Fax: [phone]   RxID:   [id number]    Medication Administration  Injection # 1:    Medication: Vit B12 1000 mcg    Diagnosis: B12 DEFICIENCY (ICD-266.2)    Route: IM    Site: R deltoid    Exp Date: 06/2011    Lot #: [id number]    Mfr: [employer]    Patient tolerated injection without complications    Given by: Ami Bullins CMA (Nov 24, 2009 9:03 AM)  Orders Added: 1)  Admin of Therapeutic Inj  intramuscular or subcutaneous [96372] 2)  Vit B12 1000 mcg [J3420] 3)  Tobacco use cessation intermediate 3-10 minutes [99406] 4)  Est. Patient Level III [40347]

## 2010-08-09 NOTE — Progress Notes (Signed)
Summary: Stool Studies  Phone Note Outgoing Call Call back at [facility] Phone [phone]   Call placed by: Harlow Mares CMA Duncan Dull),  May 16, 2010 9:07 AM Call placed to: Patient Summary of Call: called patient multiple times about her stool studies and the female that answers the phone always says she will give her a message and have her cll me back but they will nto release her number to me. I have again left a message with this female with my name and phone number. Initial call taken by: Harlow Mares CMA (AAMA),  May 16, 2010 9:08 AM     Appended Document: Stool Studies pt called back and updated her number and she states she does not plan on doing the stooll studies.

## 2010-08-09 NOTE — Assessment & Plan Note (Signed)
Summary: COUGH/ GOING ON FOR A WHILE/WORSE/NWS  #   Vital Signs:  Patient profile:   55 year old female Height:      62 inches Weight:      262 pounds BMI:     48.09 O2 Sat:      97 % on Room air Temp:     98.6 degrees F oral Pulse rate:   99 / minute Pulse rhythm:   regular Resp:     16 per minute BP sitting:   124 / 72  (left arm) Cuff size:   large  Vitals Entered By: Rock Nephew CMA (November 02, 2009 9:22 AM)  Nutrition Counseling: Patient's BMI is greater than 25 and therefore counseled on weight management options.  O2 Flow:  Room air CC: cough Is Patient Diabetic? No Pain Assessment Patient in pain? no        Primary Care Provider:  Etta Grandchild MD  CC:  cough.  History of Present Illness:  Cough      This is a 55 year old woman who presents with Cough.  The symptoms began 2 months ago.  The intensity is described as mild.  The patient reports non-productive cough, shortness of breath, wheezing, and exertional dyspnea, but denies productive cough, pleuritic chest pain, fever, hemoptysis, and malaise.  The patient denies the following symptoms: cold/URI symptoms, sore throat, nasal congestion, chronic rhinitis, weight loss, acid reflux symptoms, and peripheral edema.  The cough is worse with smoking.  Risk factors include history of reflux.    Dyspepsia History:      She has no alarm features of dyspepsia including no history of melena, hematochezia, dysphagia, persistent vomiting, or involuntary weight loss > 5%.  There is a prior history of GERD.  The patient does not have a prior history of documented ulcer disease.  The dominant symptom is heartburn or acid reflux.  An H-2 blocker medication is not currently being taken.     Preventive Screening-Counseling & Management  Alcohol-Tobacco     Alcohol drinks/day: 0     Smoking Status: current     Smoking Cessation Counseling: yes     Smoke Cessation Stage: precontemplative     Packs/Day: 1     Year  Started: 1966     Cans of tobacco/week: no     Passive Smoke Exposure: yes  Hep-HIV-STD-Contraception     Hepatitis Risk: no risk noted     HIV Risk: no     STD Risk: no risk noted      Drug Use:  no.    Clinical Review Panels:  Lipid Management   Cholesterol:  244 (10/20/2009)   LDL (bad choesterol):  DEL (06/01/2008)   HDL (good cholesterol):  72.00 (10/20/2009)  Diabetes Management   Creatinine:  0.8 (10/20/2009)   Last Flu Vaccine:  Fluvax 3+ (04/19/2009)   Last Pneumovax:  Pneumovax (04/19/2009)  CBC   WBC:  6.8 (10/20/2009)   RBC:  4.23 (10/20/2009)   Hgb:  12.4 (10/20/2009)   Hct:  36.8 (10/20/2009)   Platelets:  279.0 (10/20/2009)   MCV  86.9 (10/20/2009)   MCHC  33.6 (10/20/2009)   RDW  15.2 (10/20/2009)   PMN:  51.3 (10/20/2009)   Lymphs:  37.6 (10/20/2009)   Monos:  9.8 (10/20/2009)   Eosinophils:  0.8 (10/20/2009)   Basophil:  0.5 (10/20/2009)  Complete Metabolic Panel   Glucose:  105 (10/20/2009)   Sodium:  142 (10/20/2009)   Potassium:  4.2 (10/20/2009)  Chloride:  106 (10/20/2009)   CO2:  29 (10/20/2009)   BUN:  11 (10/20/2009)   Creatinine:  0.8 (10/20/2009)   Albumin:  3.7 (10/20/2009)   Total Protein:  7.7 (10/20/2009)   Calcium:  9.3 (10/20/2009)   Total Bili:  0.2 (10/20/2009)   Alk Phos:  109 (10/20/2009)   SGPT (ALT):  20 (10/20/2009)   SGOT (AST):  17 (10/20/2009)   Medications Prior to Update: 1)  Effexor Xr 75 Mg Xr24h-Cap (Venlafaxine Hcl) .... 2am and 1qhs 2)  Ibuprofen 800 Mg Tabs (Ibuprofen) .... One By Mouth Three Times A Day As Needed Pain 3)  Excedrin Migraine 250-250-65 Mg Tabs (Aspirin-Acetaminophen-Caffeine) .... As Needed 4)  Ventolin Hfa 108 (90 Base) Mcg/act Aers (Albuterol Sulfate) .Marland Kitchen.. 1-2 Puffs Qid Prn 5)  Ativan 1 Mg Tabs (Lorazepam) .... One By Mouth Three Times A Day For Anxiety  Current Medications (verified): 1)  Prozac 20 Mg Caps (Fluoxetine Hcl) .... Take 1 Tablet By Mouth Every Morning 2)  Ibuprofen 800  Mg Tabs (Ibuprofen) .... One By Mouth Three Times A Day As Needed Pain 3)  Excedrin Migraine 250-250-65 Mg Tabs (Aspirin-Acetaminophen-Caffeine) .... As Needed 4)  Ventolin Hfa 108 (90 Base) Mcg/act Aers (Albuterol Sulfate) .Marland Kitchen.. 1-2 Puffs Qid Prn 5)  Ativan 1 Mg Tabs (Lorazepam) .... One By Mouth Three Times A Day For Anxiety 6)  Spiriva Handihaler 18 Mcg Caps (Tiotropium Bromide Monohydrate) .... One Puff Once Daily 7)  Dexilant 60 Mg Cpdr (Dexlansoprazole) .... One By Mouth Once Daily For Heartburn  Allergies (verified): 1)  ! * Chantix  Past History:  Past Medical History: Reviewed history from 03/02/2009 and no changes required. COPD Depression GERD Headache Hyperlipidemia Allergic rhinitis Chronic B serous otitis 2010 Asthma  Past Surgical History: Reviewed history from 06/23/2008 and no changes required. CTS release bilaterally Cholecystectomy 1991 Tonsillectomy Knee Arthroscopy x2  Family History: Reviewed history from 06/01/2008 and no changes required. Adopted  Social History: Reviewed history from 06/01/2008 and no changes required. Divorced Current Smoker Drug use-no Regular exercise-no Disabled due to depressionHepatitis Risk:  no risk noted STD Risk:  no risk noted  Review of Systems       The patient complains of weight gain, prolonged cough, and severe indigestion/heartburn.  The patient denies anorexia, fever, weight loss, chest pain, syncope, dyspnea on exertion, peripheral edema, headaches, hemoptysis, abdominal pain, melena, hematochezia, hematuria, suspicious skin lesions, enlarged lymph nodes, and angioedema.   Resp:  Complains of cough, shortness of breath, and wheezing; denies chest discomfort, chest pain with inspiration, coughing up blood, excessive snoring, hypersomnolence, pleuritic, and sputum productive.  Physical Exam  General:  alert, well-developed, well-nourished, well-hydrated, and overweight-appearing.   Head:  normocephalic,  atraumatic, and no abnormalities observed.   Ears:  R ear normal and L ear normal.   Nose:  External nasal examination shows no deformity or inflammation. Nasal mucosa are pink and moist without lesions or exudates. Mouth:  Oral mucosa and oropharynx without lesions or exudates.  Teeth in good repair. Neck:  supple, full ROM, no masses, no cervical lymphadenopathy, and no neck tenderness.   Lungs:  normal respiratory effort, no intercostal retractions, no accessory muscle use, normal breath sounds, no dullness, no fremitus, no crackles, and no wheezes.   Heart:  normal rate, regular rhythm, no murmur, no gallop, no rub, and no JVD.   Abdomen:  soft, non-tender, normal bowel sounds, no distention, no masses, no guarding, no rigidity, no rebound tenderness, no hepatomegaly, and no splenomegaly.  Msk:  normal ROM, no joint tenderness, no joint swelling, no joint warmth, no redness over joints, no joint deformities, no joint instability, and no crepitation.   Pulses:  R and L carotid,radial,femoral,dorsalis pedis and posterior tibial pulses are full and equal bilaterally Extremities:  No clubbing, cyanosis, edema, or deformity noted with normal full range of motion of all joints.   Neurologic:  No cranial nerve deficits noted. Station and gait are normal. Plantar reflexes are down-going bilaterally. DTRs are symmetrical throughout. Sensory, motor and coordinative functions appear intact. Skin:  turgor normal, color normal, no rashes, no suspicious lesions, no ecchymoses, no petechiae, no purpura, no ulcerations, and no edema.   Cervical Nodes:  no anterior cervical adenopathy and no posterior cervical adenopathy.   Axillary Nodes:  no R axillary adenopathy and no L axillary adenopathy.   Inguinal Nodes:  no R inguinal adenopathy and no L inguinal adenopathy.   Psych:  Cognition and judgment appear intact. Alert and cooperative with normal attention span and concentration. No apparent delusions,  illusions, hallucinations   Impression & Recommendations:  Problem # 1:  COUGH (ICD-786.2) Assessment New will check for pna, edema, mass, etc. Orders: Pulmonary Referral (Pulmonary) T-2 View CXR (71020TC) Tobacco use cessation intermediate 3-10 minutes (16109)  Problem # 2:  ASTHMA (ICD-493.90) Assessment: Deteriorated start spiriva Her updated medication list for this problem includes:    Ventolin Hfa 108 (90 Base) Mcg/act Aers (Albuterol sulfate) .Marland Kitchen... 1-2 puffs qid prn    Spiriva Handihaler 18 Mcg Caps (Tiotropium bromide monohydrate) ..... One puff once daily  Orders: Pulmonary Referral (Pulmonary) Tobacco use cessation intermediate 3-10 minutes (60454)  Problem # 3:  COPD (ICD-496) Assessment: Deteriorated  Her updated medication list for this problem includes:    Ventolin Hfa 108 (90 Base) Mcg/act Aers (Albuterol sulfate) .Marland Kitchen... 1-2 puffs qid prn    Spiriva Handihaler 18 Mcg Caps (Tiotropium bromide monohydrate) ..... One puff once daily  Orders: Pulmonary Referral (Pulmonary)  Problem # 4:  TOBACCO USE (ICD-305.1) Assessment: Unchanged  Encouraged smoking cessation and discussed different methods for smoking cessation.   Orders: Tobacco use cessation intermediate 3-10 minutes (99406)  Problem # 5:  GERD (ICD-530.81) Assessment: Deteriorated  Her updated medication list for this problem includes:    Dexilant 60 Mg Cpdr (Dexlansoprazole) ..... One by mouth once daily for heartburn  Labs Reviewed: Hgb: 12.4 (10/20/2009)   Hct: 36.8 (10/20/2009)  Complete Medication List: 1)  Prozac 20 Mg Caps (Fluoxetine hcl) .... Take 1 tablet by mouth every morning 2)  Ibuprofen 800 Mg Tabs (Ibuprofen) .... One by mouth three times a day as needed pain 3)  Excedrin Migraine 250-250-65 Mg Tabs (Aspirin-acetaminophen-caffeine) .... As needed 4)  Ventolin Hfa 108 (90 Base) Mcg/act Aers (Albuterol sulfate) .Marland Kitchen.. 1-2 puffs qid prn 5)  Ativan 1 Mg Tabs (Lorazepam) .... One by  mouth three times a day for anxiety 6)  Spiriva Handihaler 18 Mcg Caps (Tiotropium bromide monohydrate) .... One puff once daily 7)  Dexilant 60 Mg Cpdr (Dexlansoprazole) .... One by mouth once daily for heartburn  Patient Instructions: 1)  Please schedule a follow-up appointment in 1 month. 2)  Avoid foods high in acid (tomatoes, citrus juices, spicy foods). Avoid eating within two hours of lying down or before exercising. Do not over eat; try smaller more frequent meals. Elevate head of bed twelve inches when sleeping. 3)  Tobacco is very bad for your health and your loved ones! You Should stop smoking!. 4)  Stop Smoking Tips:  Choose a Quit date. Cut down before the Quit date. decide what you will do as a substitute when you feel the urge to smoke(gum,toothpick,exercise). 5)  It is important that you exercise regularly at least 20 minutes 5 times a week. If you develop chest pain, have severe difficulty breathing, or feel very tired , stop exercising immediately and seek medical attention. 6)  You need to lose weight. Consider a lower calorie diet and regular exercise.  Prescriptions: DEXILANT 60 MG CPDR (DEXLANSOPRAZOLE) One by mouth once daily for heartburn  #75 x 0   Entered and Authorized by:   Etta Grandchild MD   Signed by:   Etta Grandchild MD on 11/02/2009   Method used:   Samples Given   RxID:   [id number] SPIRIVA HANDIHALER 18 MCG CAPS (TIOTROPIUM BROMIDE MONOHYDRATE) One puff once daily  #20 x 0   Entered and Authorized by:   Etta Grandchild MD   Signed by:   Etta Grandchild MD on 11/02/2009   Method used:   Samples Given   RxID:   [id number]

## 2010-08-11 NOTE — Progress Notes (Signed)
  Phone Note Outgoing Call      New/Updated Medications: FOLIC ACID 1 MG TABS (FOLIC ACID) One by mouth once daily Prescriptions: FOLIC ACID 1 MG TABS (FOLIC ACID) One by mouth once daily  #30 x 11   Entered and Authorized by:   Etta Grandchild MD   Signed by:   Etta Grandchild MD on 07/26/2010   Method used:   [facility] to        [facility]  [address] [phone]* (retail)       [address]       [place], [place]  [address]       Ph: [phone] or [phone]       Fax: [phone]   RxID:   [id number]

## 2010-08-11 NOTE — Letter (Signed)
Summary: Lipid Letter  [facility]  [address] [place], [place] [address]   Phone: [phone]  Fax: [phone]    07/26/2010  [facility] [address] [place], [place]  [address]  Dear [name]:  We have carefully reviewed your last lipid profile from 06/01/2008 and the results are noted below with a summary of recommendations for lipid management.    Cholesterol:       208     Goal: <200   HDL "good" Cholesterol:   27.78     Goal: >40   LDL "bad" Cholesterol:   138     Goal: <160   Triglycerides:       88.0     Goal: <150        TLC Diet (Therapeutic Lifestyle Change): Saturated Fats & Transfatty acids should be kept < 7% of total calories ***Reduce Saturated Fats Polyunstaurated Fat can be up to 10% of total calories Monounsaturated Fat Fat can be up to 20% of total calories Total Fat should be no greater than 25-35% of total calories Carbohydrates should be 50-60% of total calories Protein should be approximately 15% of total calories Fiber should be at least 20-30 grams a day ***Increased fiber may help lower LDL Total Cholesterol should be < 200mg /day Consider adding plant stanol/sterols to diet (example: Benacol spread) ***A higher intake of unsaturated fat may reduce Triglycerides and Increase HDL    Adjunctive Measures (may lower LIPIDS and reduce risk of Heart Attack) include: Aerobic Exercise (20-30 minutes 3-4 times a week) Limit Alcohol Consumption Weight Reduction Aspirin 75-81 mg a day by mouth (if not allergic or contraindicated) Dietary Fiber 20-30 grams a day by mouth     Current Medications: 1)    Prozac 20 Mg Caps (Fluoxetine hcl) .... Take 1 tablet by mouth bid 2)    Ibuprofen 800 Mg Tabs (Ibuprofen) .... One by mouth three times a day as needed pain 3)    Excedrin Migraine 250-250-65 Mg Tabs (Aspirin-acetaminophen-caffeine) .... As needed 4)    Ventolin Hfa 108 (90 Base) Mcg/act Aers (Albuterol sulfate) .Marland Kitchen..  1-2 puffs qid prn 5)    Ativan 1 Mg Tabs (Lorazepam) .... One by mouth three times a day for anxiety 6)    Spiriva Handihaler 18 Mcg Caps (Tiotropium bromide monohydrate) .... One puff once daily 7)    Pepto-bismol 262 Mg/15ml Susp (Bismuth subsalicylate) .... Will use up to three times daily 8)    Tums 500 Mg Chew (Calcium carbonate antacid) .... As needed 9)    Topiramate 100 Mg Tabs (Topiramate) .... Take 1 tab by mouth at bedtime 10)    Tobradex 0.3-0.1 % Susp (Tobramycin-dexamethasone) .... As directed  If you have any questions, please call. We appreciate being able to work with you.   Sincerely,    [facility] Etta Grandchild MD

## 2010-08-11 NOTE — Letter (Signed)
Summary: Results Follow-up Letter  [facility]  [address] [place], [place] [address]   Phone: [phone]  Fax: [phone]    07/26/2010  [address] [place], [place]  [address]  Dear Danielle Holt,   The following are the results of your recent test(s):  Test     Result     Folate level   low B12 level     normal Potassium     low CBC       normal Liver/kidney   normal   _________________________________________________________  Please call for an appointment soon - see included prescription for folate _________________________________________________________ _________________________________________________________ _________________________________________________________  Sincerely,  Sanda Linger MD [facility]           Appended Document: Results Follow-up Letter called and notified pt of results// mailed out out also [place]

## 2010-08-11 NOTE — Assessment & Plan Note (Signed)
Summary: FU---STC   Vital Signs:  Patient profile:   55 year old female Menstrual status:  postmenopausal Height:      62 inches Weight:      251 pounds BMI:     46.07 O2 Sat:      97 % on Room air Temp:     98.7 degrees F oral Pulse rate:   81 / minute Pulse rhythm:   regular Resp:     16 per minute BP sitting:   130 / 70  (left arm) Cuff size:   large  Vitals Entered By: Rock Nephew CMA (July 25, 2010 8:31 AM)  Nutrition Counseling: Patient's BMI is greater than 25 and therefore counseled on weight management options.  O2 Flow:  Room air CC: Patient c/o dizziness Is Patient Diabetic? No Pain Assessment Patient in pain? no       Does patient need assistance? Functional Status Self care Ambulation Normal     Menstrual Status postmenopausal Last PAP Result NEGATIVE FOR INTRAEPITHELIAL LESIONS OR MALIGNANCY.   Primary Care Provider:  Etta Grandchild, MD  CC:  Patient c/o dizziness.  History of Present Illness: She returns for f/up and is pleased to tell me that she has lost 13 pounds with diet and exercise. She has diarrhea that is chronic and unchanged. She has not been getting any form of B12 replacement.  Preventive Screening-Counseling & Management  Alcohol-Tobacco     Alcohol drinks/day: 0     Alcohol Counseling: not indicated; patient does not drink     Smoking Status: current     Smoking Cessation Counseling: yes     Smoke Cessation Stage: precontemplative     Packs/Day: 1     Year Started: 1968     Cans of tobacco/week: no     Passive Smoke Exposure: yes     Tobacco Counseling: to quit use of tobacco products  Hep-HIV-STD-Contraception     Hepatitis Risk: no risk noted     HIV Risk: no     STD Risk: no risk noted      Drug Use:  no.    Clinical Review Panels:  Prevention   Last Mammogram:  ASSESSMENT: Negative - BI-RADS 1^MM DIGITAL SCREENING (11/16/2009)   Last Pap Smear:  NEGATIVE FOR INTRAEPITHELIAL LESIONS OR MALIGNANCY.  (10/20/2009)   Last Colonoscopy:  DONE (01/19/2010)  Immunizations   Last Tetanus Booster:  Tdap (09/14/2009)   Last Flu Vaccine:  Fluvax 3+ (04/19/2009)   Last Pneumovax:  Pneumovax (04/19/2009)  Lipid Management   Cholesterol:  244 (10/20/2009)   LDL (bad choesterol):  DEL (06/01/2008)   HDL (good cholesterol):  72.00 (10/20/2009)  Diabetes Management   Creatinine:  0.9 (02/22/2010)   Last Flu Vaccine:  Fluvax 3+ (04/19/2009)   Last Pneumovax:  Pneumovax (04/19/2009)  CBC   WBC:  6.2 (02/22/2010)   RBC:  4.35 (02/22/2010)   Hgb:  12.6 (02/22/2010)   Hct:  37.3 (02/22/2010)   Platelets:  274.0 (02/22/2010)   MCV  85.7 (02/22/2010)   MCHC  33.8 (02/22/2010)   RDW  14.8 (02/22/2010)   PMN:  61.3 (02/22/2010)   Lymphs:  27.7 (02/22/2010)   Monos:  9.6 (02/22/2010)   Eosinophils:  0.9 (02/22/2010)   Basophil:  0.5 (02/22/2010)  Complete Metabolic Panel   Glucose:  96 (02/22/2010)   Sodium:  141 (02/22/2010)   Potassium:  3.7 (02/22/2010)   Chloride:  107 (02/22/2010)   CO2:  25 (02/22/2010)   BUN:  12 (02/22/2010)  Creatinine:  0.9 (02/22/2010)   Albumin:  3.8 (02/22/2010)   Total Protein:  6.9 (02/22/2010)   Calcium:  9.1 (02/22/2010)   Total Bili:  0.4 (02/22/2010)   Alk Phos:  91 (02/22/2010)   SGPT (ALT):  22 (02/22/2010)   SGOT (AST):  21 (02/22/2010)   Medications Prior to Update: 1)  Prozac 20 Mg Caps (Fluoxetine Hcl) .... Take 1 Tablet By Mouth Bid 2)  Ibuprofen 800 Mg Tabs (Ibuprofen) .... One By Mouth Three Times A Day As Needed Pain 3)  Excedrin Migraine 250-250-65 Mg Tabs (Aspirin-Acetaminophen-Caffeine) .... As Needed 4)  Ventolin Hfa 108 (90 Base) Mcg/act Aers (Albuterol Sulfate) .Marland Kitchen.. 1-2 Puffs Qid Prn 5)  Ativan 1 Mg Tabs (Lorazepam) .... One By Mouth Three Times A Day For Anxiety 6)  Spiriva Handihaler 18 Mcg Caps (Tiotropium Bromide Monohydrate) .... One Puff Once Daily 7)  Dexilant 60 Mg Cpdr (Dexlansoprazole) .... One By Mouth Once Daily For  Heartburn 8)  Pepto-Bismol 262 Mg/42ml Susp (Bismuth Subsalicylate) .... Will Use Up To Three Times Daily 9)  Tums 500 Mg Chew (Calcium Carbonate Antacid) .... As Needed 10)  Colestid 1 Gm Tabs (Colestipol Hcl) .Marland Kitchen.. 1 Tab Mid Morning For Dairrhea,  May Increase Up To 4 Tabs Per Day If Needed. 11)  Folic Acid 1 Mg Tabs (Folic Acid) .Marland Kitchen.. 1 By Mouth Once Daily  Current Medications (verified): 1)  Prozac 20 Mg Caps (Fluoxetine Hcl) .... Take 1 Tablet By Mouth Bid 2)  Ibuprofen 800 Mg Tabs (Ibuprofen) .... One By Mouth Three Times A Day As Needed Pain 3)  Excedrin Migraine 250-250-65 Mg Tabs (Aspirin-Acetaminophen-Caffeine) .... As Needed 4)  Ventolin Hfa 108 (90 Base) Mcg/act Aers (Albuterol Sulfate) .Marland Kitchen.. 1-2 Puffs Qid Prn 5)  Ativan 1 Mg Tabs (Lorazepam) .... One By Mouth Three Times A Day For Anxiety 6)  Spiriva Handihaler 18 Mcg Caps (Tiotropium Bromide Monohydrate) .... One Puff Once Daily 7)  Pepto-Bismol 262 Mg/40ml Susp (Bismuth Subsalicylate) .... Will Use Up To Three Times Daily 8)  Tums 500 Mg Chew (Calcium Carbonate Antacid) .... As Needed 9)  Topiramate 100 Mg Tabs (Topiramate) .... Take 1 Tab By Mouth At Bedtime 10)  Tobradex 0.3-0.1 % Susp (Tobramycin-Dexamethasone) .... As Directed  Allergies (verified): 1)  ! * Chantix 2)  ! Ambien  Past History:  Past Medical History: Last updated: 11/24/2009 COPD Depression GERD Headache Hyperlipidemia Allergic rhinitis Asthma  Past Surgical History: Last updated: 06/23/2008 CTS release bilaterally Cholecystectomy 1991 Tonsillectomy Knee Arthroscopy x2  Family History: Last updated: 06/01/2008 Adopted  Social History: Last updated: 06/01/2008 Divorced Current Smoker Drug use-no Regular exercise-no Disabled due to depression  Risk Factors: Alcohol Use: 0 (07/25/2010) Caffeine Use: 2 (06/23/2008) Exercise: no (06/01/2008)  Risk Factors: Smoking Status: current (07/25/2010) Packs/Day: 1 (07/25/2010) Cans of  tobacco/wk: no (07/25/2010) Passive Smoke Exposure: yes (07/25/2010)  Family History: Reviewed history from 06/01/2008 and no changes required. Adopted  Social History: Reviewed history from 06/01/2008 and no changes required. Divorced Current Smoker Drug use-no Regular exercise-no Disabled due to depression  Review of Systems  The patient denies anorexia, fever, weight gain, chest pain, syncope, dyspnea on exertion, peripheral edema, prolonged cough, headaches, hemoptysis, abdominal pain, hematuria, muscle weakness, suspicious skin lesions, difficulty walking, depression, abnormal bleeding, enlarged lymph nodes, and angioedema.   GI:  Complains of diarrhea; denies abdominal pain, bloody stools, change in bowel habits, constipation, dark tarry stools, excessive appetite, gas, hemorrhoids, indigestion, loss of appetite, nausea, vomiting, and yellowish skin color. Heme:  Denies abnormal bruising, bleeding, enlarge lymph nodes, fevers, pallor, and skin discoloration.  Physical Exam  General:  alert, well-developed, well-nourished, well-hydrated, and overweight-appearing.   Head:  normocephalic, atraumatic, no abnormalities observed, and no abnormalities palpated.   Mouth:  Oral mucosa and oropharynx without lesions or exudates.  Teeth in good repair. Neck:  supple, full ROM, no masses, no cervical lymphadenopathy, and no neck tenderness.   Lungs:  normal respiratory effort, no intercostal retractions, no accessory muscle use, normal breath sounds, no dullness, no fremitus, no crackles, and no wheezes.   Heart:  normal rate, regular rhythm, no murmur, no gallop, no rub, and no JVD.   Abdomen:  soft, non-tender, normal bowel sounds, no distention, no masses, no guarding, no rigidity, no rebound tenderness, no hepatomegaly, and no splenomegaly.   Msk:  normal ROM, no joint tenderness, no joint swelling, no joint warmth, no redness over joints, no joint deformities, no joint instability, and  no crepitation.   Pulses:  R and L carotid,radial,femoral,dorsalis pedis and posterior tibial pulses are full and equal bilaterally Extremities:  No clubbing, cyanosis, edema, or deformity noted with normal full range of motion of all joints.   Neurologic:  No cranial nerve deficits noted. Station and gait are normal. Plantar reflexes are down-going bilaterally. DTRs are symmetrical throughout. Sensory, motor and coordinative functions appear intact. Skin:  turgor normal, color normal, no rashes, no suspicious lesions, no ecchymoses, no petechiae, no purpura, no ulcerations, and no edema.   Cervical Nodes:  no anterior cervical adenopathy and no posterior cervical adenopathy.   Psych:  Cognition and judgment appear intact. Alert and cooperative with normal attention span and concentration. No apparent delusions, illusions, hallucinations   Impression & Recommendations:  Problem # 1:  ELEVATED BLOOD PRESSURE (ICD-796.2) Assessment Improved  Orders: Venipuncture (35573) TLB-Lipid Panel (80061-LIPID) TLB-BMP (Basic Metabolic Panel-BMET) (80048-METABOL) TLB-CBC Platelet - w/Differential (85025-CBCD) TLB-Hepatic/Liver Function Pnl (80076-HEPATIC) TLB-B12 + Folate Pnl (22025_42706-C37/SEG) TLB-TSH (Thyroid Stimulating Hormone) (84443-TSH)  BP today: 130/70 Prior BP: 114/72 (03/24/2010)  Prior 10 Yr Risk Heart Disease: 6 % (06/16/2008)  Labs Reviewed: Creat: 0.9 (02/22/2010) Chol: 244 (10/20/2009)   HDL: 72.00 (10/20/2009)   LDL: DEL (06/01/2008)   TG: 89.0 (10/20/2009)  Instructed in low sodium diet (DASH Handout) and behavior modification.    Problem # 2:  MORBID OBESITY (ICD-278.01) Assessment: Improved  Ht: 62 (07/25/2010)   Wt: 251 (07/25/2010)   BMI: 46.07 (07/25/2010)  Problem # 3:  B12 DEFICIENCY (ICD-266.2) Assessment: Unchanged  Orders: Venipuncture (31517) TLB-Lipid Panel (80061-LIPID) TLB-BMP (Basic Metabolic Panel-BMET) (80048-METABOL) TLB-CBC Platelet -  w/Differential (85025-CBCD) TLB-Hepatic/Liver Function Pnl (80076-HEPATIC) TLB-B12 + Folate Pnl (61607_37106-Y69/SWN) TLB-TSH (Thyroid Stimulating Hormone) (84443-TSH) Admin of Therapeutic Inj  intramuscular or subcutaneous (46270) Vit B12 1000 mcg (J3420)  Problem # 4:  HYPERLIPIDEMIA (ICD-272.4) Assessment: Unchanged  The following medications were removed from the medication list:    Colestid 1 Gm Tabs (Colestipol hcl) .Marland Kitchen... 1 tab mid morning for dairrhea,  may increase up to 4 tabs per day if needed.  Orders: Venipuncture (35009) TLB-Lipid Panel (80061-LIPID) TLB-BMP (Basic Metabolic Panel-BMET) (80048-METABOL) TLB-CBC Platelet - w/Differential (85025-CBCD) TLB-Hepatic/Liver Function Pnl (80076-HEPATIC) TLB-B12 + Folate Pnl (38182_99371-I96/VEL) TLB-TSH (Thyroid Stimulating Hormone) (84443-TSH)  Labs Reviewed: SGOT: 21 (02/22/2010)   SGPT: 22 (02/22/2010)  Lipid Goals: Chol Goal: 200 (06/16/2008)   HDL Goal: 40 (06/16/2008)   LDL Goal: 160 (06/16/2008)   TG Goal: 150 (06/16/2008)  Prior 10 Yr Risk Heart Disease: 6 % (06/16/2008)   HDL:72.00 (10/20/2009), 76.6 (  06/01/2008)  LDL:DEL (06/01/2008)  Chol:244 (10/20/2009), 203 (06/01/2008)  Trig:89.0 (10/20/2009), 107 (06/01/2008)  Complete Medication List: 1)  Prozac 20 Mg Caps (Fluoxetine hcl) .... Take 1 tablet by mouth bid 2)  Ibuprofen 800 Mg Tabs (Ibuprofen) .... One by mouth three times a day as needed pain 3)  Excedrin Migraine 250-250-65 Mg Tabs (Aspirin-acetaminophen-caffeine) .... As needed 4)  Ventolin Hfa 108 (90 Base) Mcg/act Aers (Albuterol sulfate) .Marland Kitchen.. 1-2 puffs qid prn 5)  Ativan 1 Mg Tabs (Lorazepam) .... One by mouth three times a day for anxiety 6)  Spiriva Handihaler 18 Mcg Caps (Tiotropium bromide monohydrate) .... One puff once daily 7)  Pepto-bismol 262 Mg/77ml Susp (Bismuth subsalicylate) .... Will use up to three times daily 8)  Tums 500 Mg Chew (Calcium carbonate antacid) .... As needed 9)  Topiramate  100 Mg Tabs (Topiramate) .... Take 1 tab by mouth at bedtime 10)  Tobradex 0.3-0.1 % Susp (Tobramycin-dexamethasone) .... As directed  Patient Instructions: 1)  Please schedule a follow-up appointment in 1 month. 2)  Tobacco is very bad for your health and your loved ones! You Should stop smoking!. 3)  Stop Smoking Tips: Choose a Quit date. Cut down before the Quit date. decide what you will do as a substitute when you feel the urge to smoke(gum,toothpick,exercise). 4)  It is important that you exercise regularly at least 20 minutes 5 times a week. If you develop chest pain, have severe difficulty breathing, or feel very tired , stop exercising immediately and seek medical attention. 5)  You need to lose weight. Consider a lower calorie diet and regular exercise.    Medication Administration  Injection # 1:    Medication: Vit B12 1000 mcg    Diagnosis: B12 DEFICIENCY (ICD-266.2)    Route: IM    Site: R deltoid    Exp Date: 03/2012    Lot #: [id number]    Mfr: [employer]    Patient tolerated injection without complications    Given by: Rock Nephew CMA (July 25, 2010 8:54 AM)  Orders Added: 1)  Venipuncture [29528] 2)  TLB-Lipid Panel [80061-LIPID] 3)  TLB-BMP (Basic Metabolic Panel-BMET) [80048-METABOL] 4)  TLB-CBC Platelet - w/Differential [85025-CBCD] 5)  TLB-Hepatic/Liver Function Pnl [80076-HEPATIC] 6)  TLB-B12 + Folate Pnl [82746_82607-B12/FOL] 7)  TLB-TSH (Thyroid Stimulating Hormone) [84443-TSH] 8)  Admin of Therapeutic Inj  intramuscular or subcutaneous [96372] 9)  Vit B12 1000 mcg [J3420] 10)  Est. Patient Level IV [41324]     Medication Administration  Injection # 1:    Medication: Vit B12 1000 mcg    Diagnosis: B12 DEFICIENCY (ICD-266.2)    Route: IM    Site: R deltoid    Exp Date: 03/2012    Lot #: [id number]    Mfr: [employer]    Patient tolerated injection without complications    Given by: Rock Nephew CMA (July 25, 2010  8:54 AM)  Orders Added: 1)  Venipuncture [53664] 2)  TLB-Lipid Panel [80061-LIPID] 3)  TLB-BMP (Basic Metabolic Panel-BMET) [80048-METABOL] 4)  TLB-CBC Platelet - w/Differential [85025-CBCD] 5)  TLB-Hepatic/Liver Function Pnl [80076-HEPATIC] 6)  TLB-B12 + Folate Pnl [82746_82607-B12/FOL] 7)  TLB-TSH (Thyroid Stimulating Hormone) [84443-TSH] 8)  Admin of Therapeutic Inj  intramuscular or subcutaneous [96372] 9)  Vit B12 1000 mcg [J3420] 10)  Est. Patient Level IV [40347]

## 2010-08-29 ENCOUNTER — Ambulatory Visit: Payer: Self-pay | Admitting: Internal Medicine

## 2010-09-22 LAB — POCT CARDIAC MARKERS: Troponin i, poc: <0.05 ng/mL (ref 0.00–0.09)

## 2010-09-26 ENCOUNTER — Ambulatory Visit (INDEPENDENT_AMBULATORY_CARE_PROVIDER_SITE_OTHER): Payer: Medicare Other | Admitting: Internal Medicine

## 2010-09-26 ENCOUNTER — Other Ambulatory Visit: Payer: Medicare Other

## 2010-09-26 ENCOUNTER — Encounter: Payer: Self-pay | Admitting: Internal Medicine

## 2010-09-26 ENCOUNTER — Other Ambulatory Visit: Payer: Self-pay | Admitting: Internal Medicine

## 2010-09-26 DIAGNOSIS — E876 Hypokalemia: Secondary | ICD-10-CM

## 2010-09-26 DIAGNOSIS — F329 Major depressive disorder, single episode, unspecified: Secondary | ICD-10-CM

## 2010-09-26 DIAGNOSIS — G4700 Insomnia, unspecified: Secondary | ICD-10-CM

## 2010-09-26 DIAGNOSIS — E538 Deficiency of other specified B group vitamins: Secondary | ICD-10-CM

## 2010-09-26 DIAGNOSIS — M779 Enthesopathy, unspecified: Secondary | ICD-10-CM

## 2010-09-26 DIAGNOSIS — F17200 Nicotine dependence, unspecified, uncomplicated: Secondary | ICD-10-CM

## 2010-09-26 LAB — BASIC METABOLIC PANEL
Calcium: 9.3 mg/dL (ref 8.4–10.5)
GFR: 100.89 mL/min (ref >60.00)
Potassium: 4.4 mEq/L (ref 3.5–5.1)
Sodium: 137 mEq/L (ref 135–145)

## 2010-09-26 LAB — B12 AND FOLATE PANEL
Folate: 7.2 ng/mL (ref >5.9)
Vitamin B-12: >1500 pg/mL — ABNORMAL HIGH (ref 211–911)

## 2010-10-06 NOTE — Assessment & Plan Note (Signed)
Summary: FU W/B12 INJ--MED FU---STC   Vital Signs:  Patient profile:   55 year old female Menstrual status:  postmenopausal Height:      62 inches Weight:      240 pounds BMI:     44.06 O2 Sat:      97 % on Room air Temp:     98.7 degrees F oral Pulse rate:   79 / minute Pulse rhythm:   regular Resp:     16 per minute BP sitting:   104 / 70  (left arm) Cuff size:   large  Vitals Entered By: Rock Nephew CMA (September 26, 2010 8:32 AM)  Nutrition Counseling: Patient's BMI is greater than 25 and therefore counseled on weight management options.  O2 Flow:  Room air CC: follow-up visit// b-12 and med refill Is Patient Diabetic? No Pain Assessment Patient in pain? no       Does patient need assistance? Functional Status Self care Ambulation Normal   Primary Care Provider:  Etta Grandchild, MD  CC:  follow-up visit// b-12 and med refill.  History of Present Illness:  Follow-Up Visit      This is a 55 year old woman who presents for Follow-up visit.  The patient denies chest pain, palpitations, dizziness, syncope, edema, SOB, DOE, PND, and orthopnea.  Since the last visit the patient notes no new problems or concerns.  The patient reports taking meds as prescribed and dietary compliance.  When questioned about possible medication side effects, the patient notes none.    Preventive Screening-Counseling & Management  Alcohol-Tobacco     Alcohol drinks/day: 0     Alcohol Counseling: not indicated; patient does not drink     Smoking Status: current     Smoking Cessation Counseling: yes     Smoke Cessation Stage: precontemplative     Packs/Day: 0.25     Year Started: 1968     Cans of tobacco/week: no     Passive Smoke Exposure: yes     Tobacco Counseling: to quit use of tobacco products  Hep-HIV-STD-Contraception     Hepatitis Risk: no risk noted     HIV Risk: no     STD Risk: no risk noted  Clinical Review Panels:  Prevention   Last Mammogram:  ASSESSMENT:  Negative - BI-RADS 1^MM DIGITAL SCREENING (11/16/2009)   Last Pap Smear:  NEGATIVE FOR INTRAEPITHELIAL LESIONS OR MALIGNANCY. (10/20/2009)   Last Colonoscopy:  DONE (01/19/2010)  Immunizations   Last Tetanus Booster:  Tdap (09/14/2009)   Last Flu Vaccine:  Fluvax 3+ (04/19/2009)   Last Pneumovax:  Pneumovax (04/19/2009)  Lipid Management   Cholesterol:  208 (07/25/2010)   LDL (bad choesterol):  DEL (06/01/2008)   HDL (good cholesterol):  63.00 (07/25/2010)  Diabetes Management   Creatinine:  0.9 (07/25/2010)   Last Flu Vaccine:  Fluvax 3+ (04/19/2009)   Last Pneumovax:  Pneumovax (04/19/2009)  CBC   WBC:  6.7 (07/25/2010)   RBC:  4.19 (07/25/2010)   Hgb:  12.1 (07/25/2010)   Hct:  36.2 (07/25/2010)   Platelets:  244.0 (07/25/2010)   MCV  86.4 (07/25/2010)   MCHC  33.3 (07/25/2010)   RDW  14.9 (07/25/2010)   PMN:  59.5 (07/25/2010)   Lymphs:  30.9 (07/25/2010)   Monos:  8.8 (07/25/2010)   Eosinophils:  0.2 (07/25/2010)   Basophil:  0.6 (07/25/2010)  Complete Metabolic Panel   Glucose:  88 (07/25/2010)   Sodium:  138 (07/25/2010)   Potassium:  3.3 (07/25/2010)  Chloride:  106 (07/25/2010)   CO2:  25 (07/25/2010)   BUN:  11 (07/25/2010)   Creatinine:  0.9 (07/25/2010)   Albumin:  3.7 (07/25/2010)   Total Protein:  6.7 (07/25/2010)   Calcium:  8.7 (07/25/2010)   Total Bili:  0.4 (07/25/2010)   Alk Phos:  69 (07/25/2010)   SGPT (ALT):  18 (07/25/2010)   SGOT (AST):  19 (07/25/2010)   Medications Prior to Update: 1)  Prozac 20 Mg Caps (Fluoxetine Hcl) .... Take 1 Tablet By Mouth Bid 2)  Ibuprofen 800 Mg Tabs (Ibuprofen) .... One By Mouth Three Times A Day As Needed Pain 3)  Excedrin Migraine 250-250-65 Mg Tabs (Aspirin-Acetaminophen-Caffeine) .... As Needed 4)  Ventolin Hfa 108 (90 Base) Mcg/act Aers (Albuterol Sulfate) .Marland Kitchen.. 1-2 Puffs Qid Prn 5)  Ativan 1 Mg Tabs (Lorazepam) .... One By Mouth Three Times A Day For Anxiety 6)  Spiriva Handihaler 18 Mcg Caps  (Tiotropium Bromide Monohydrate) .... One Puff Once Daily 7)  Pepto-Bismol 262 Mg/68ml Susp (Bismuth Subsalicylate) .... Will Use Up To Three Times Daily 8)  Tums 500 Mg Chew (Calcium Carbonate Antacid) .... As Needed 9)  Topiramate 100 Mg Tabs (Topiramate) .... Take 1 Tab By Mouth At Bedtime 10)  Tobradex 0.3-0.1 % Susp (Tobramycin-Dexamethasone) .... As Directed 11)  Folic Acid 1 Mg Tabs (Folic Acid) .... One By Mouth Once Daily  Current Medications (verified): 1)  Prozac 20 Mg Caps (Fluoxetine Hcl) .... Take 1 Tablet By Mouth Three Times A Day 2)  Ibuprofen 800 Mg Tabs (Ibuprofen) .... One By Mouth Three Times A Day As Needed Pain 3)  Excedrin Migraine 250-250-65 Mg Tabs (Aspirin-Acetaminophen-Caffeine) .... As Needed 4)  Ventolin Hfa 108 (90 Base) Mcg/act Aers (Albuterol Sulfate) .Marland Kitchen.. 1-2 Puffs Qid Prn 5)  Ativan 1 Mg Tabs (Lorazepam) .... One By Mouth Three Times A Day For Anxiety 6)  Spiriva Handihaler 18 Mcg Caps (Tiotropium Bromide Monohydrate) .... One Puff Once Daily 7)  Pepto-Bismol 262 Mg/5ml Susp (Bismuth Subsalicylate) .... Will Use Up To Three Times Daily 8)  Tums 500 Mg Chew (Calcium Carbonate Antacid) .... As Needed 9)  Topiramate 100 Mg Tabs (Topiramate) .... Take 1 Tab By Mouth At Bedtime 10)  Tobradex 0.3-0.1 % Susp (Tobramycin-Dexamethasone) .... As Directed 11)  Folic Acid 1 Mg Tabs (Folic Acid) .... One By Mouth Once Daily 12)  Ambien 10 Mg Tabs (Zolpidem Tartrate) .... One By Mouth At Bedtime As Needed For Insomnia  Allergies (verified): 1)  ! * Chantix  Past History:  Past Medical History: Last updated: 11/24/2009 COPD Depression GERD Headache Hyperlipidemia Allergic rhinitis Asthma  Past Surgical History: Last updated: 06/23/2008 CTS release bilaterally Cholecystectomy 1991 Tonsillectomy Knee Arthroscopy x2  Family History: Last updated: 06/01/2008 Adopted  Social History: Last updated: 06/01/2008 Divorced Current Smoker Drug  use-no Regular exercise-no Disabled due to depression  Risk Factors: Alcohol Use: 0 (09/26/2010) Caffeine Use: 2 (06/23/2008) Exercise: no (06/01/2008)  Risk Factors: Smoking Status: current (09/26/2010) Packs/Day: 0.25 (09/26/2010) Cans of tobacco/wk: no (09/26/2010) Passive Smoke Exposure: yes (09/26/2010)  Family History: Reviewed history from 06/01/2008 and no changes required. Adopted  Social History: Reviewed history from 06/01/2008 and no changes required. Divorced Current Smoker Drug use-no Regular exercise-no Disabled due to depressionPacks/Day:  0.25  Review of Systems  The patient denies anorexia, fever, weight loss, weight gain, chest pain, syncope, dyspnea on exertion, peripheral edema, prolonged cough, headaches, hemoptysis, abdominal pain, hematuria, muscle weakness, suspicious skin lesions, difficulty walking, depression, unusual weight change, abnormal bleeding,  and enlarged lymph nodes.   Psych:  Complains of anxiety; denies alternate hallucination ( auditory/visual), depression, easily angered, easily tearful, irritability, mental problems, panic attacks, sense of great danger, suicidal thoughts/plans, thoughts of violence, unusual visions or sounds, and thoughts /plans of harming others.  Physical Exam  General:  alert, well-developed, well-nourished, well-hydrated, and overweight-appearing.   Mouth:  Oral mucosa and oropharynx without lesions or exudates.  Teeth in good repair. Neck:  supple, full ROM, no masses, no cervical lymphadenopathy, and no neck tenderness.   Lungs:  normal respiratory effort, no intercostal retractions, no accessory muscle use, normal breath sounds, no dullness, no fremitus, no crackles, and no wheezes.   Heart:  normal rate, regular rhythm, no murmur, no gallop, no rub, and no JVD.   Abdomen:  soft, non-tender, normal bowel sounds, no distention, no masses, no guarding, no rigidity, no rebound tenderness, no hepatomegaly, and no  splenomegaly.   Msk:  normal ROM, no joint tenderness, no joint swelling, no joint warmth, no redness over joints, no joint deformities, no joint instability, and no crepitation.   Pulses:  R and L carotid,radial,femoral,dorsalis pedis and posterior tibial pulses are full and equal bilaterally Extremities:  No clubbing, cyanosis, edema, or deformity noted with normal full range of motion of all joints.   Neurologic:  No cranial nerve deficits noted. Station and gait are normal. Plantar reflexes are down-going bilaterally. DTRs are symmetrical throughout. Sensory, motor and coordinative functions appear intact. Skin:  turgor normal, color normal, no rashes, no suspicious lesions, no ecchymoses, no petechiae, no purpura, no ulcerations, and no edema.   Cervical Nodes:  no anterior cervical adenopathy and no posterior cervical adenopathy.   Psych:  Cognition and judgment appear intact. Alert and cooperative with normal attention span and concentration. No apparent delusions, illusions, hallucinations   Impression & Recommendations:  Problem # 1:  HYPOKALEMIA, MILD (ICD-276.8) Assessment New  Orders: TLB-B12 + Folate Pnl (82746_82607-B12/FOL) TLB-BMP (Basic Metabolic Panel-BMET) (80048-METABOL) TLB-Magnesium (Mg) (83735-MG)  Problem # 2:  INSOMNIA (ICD-780.52) Assessment: Unchanged  Her updated medication list for this problem includes:    Ambien 10 Mg Tabs (Zolpidem tartrate) ..... One by mouth at bedtime as needed for insomnia  Discussed sleep hygiene.   Problem # 3:  MORBID OBESITY (ICD-278.01) Assessment: Improved  Ht: 62 (09/26/2010)   Wt: 240 (09/26/2010)   BMI: 44.06 (09/26/2010)  Problem # 4:  B12 DEFICIENCY (ICD-266.2) Assessment: Unchanged  Orders: Admin of Therapeutic Inj  intramuscular or subcutaneous (16109) Vit B12 1000 mcg (J3420) TLB-B12 + Folate Pnl (60454_09811-B14/NWG) TLB-BMP (Basic Metabolic Panel-BMET) (80048-METABOL) TLB-Magnesium (Mg) (83735-MG)  Problem  # 5:  DEPRESSION (ICD-311) Assessment: Unchanged  Her updated medication list for this problem includes:    Prozac 20 Mg Caps (Fluoxetine hcl) .Marland Kitchen... Take 1 tablet by mouth three times a day    Ativan 1 Mg Tabs (Lorazepam) ..... One by mouth three times a day for anxiety  Discussed treatment options, including trial of antidpressant medication. Will refer to behavioral health. Follow-up call in in 24-48 hours and recheck in 2 weeks, sooner as needed. Patient agrees to call if any worsening of symptoms or thoughts of doing harm arise. Verified that the patient has no suicidal ideation at this time.   Problem # 6:  TOBACCO USE (ICD-305.1) Assessment: Improved  Encouraged smoking cessation and discussed different methods for smoking cessation.   Complete Medication List: 1)  Prozac 20 Mg Caps (Fluoxetine hcl) .... Take 1 tablet by mouth three times a day  2)  Ibuprofen 800 Mg Tabs (Ibuprofen) .... One by mouth three times a day as needed pain 3)  Excedrin Migraine 250-250-65 Mg Tabs (Aspirin-acetaminophen-caffeine) .... As needed 4)  Ventolin Hfa 108 (90 Base) Mcg/act Aers (Albuterol sulfate) .Marland Kitchen.. 1-2 puffs qid prn 5)  Ativan 1 Mg Tabs (Lorazepam) .... One by mouth three times a day for anxiety 6)  Spiriva Handihaler 18 Mcg Caps (Tiotropium bromide monohydrate) .... One puff once daily 7)  Pepto-bismol 262 Mg/54ml Susp (Bismuth subsalicylate) .... Will use up to three times daily 8)  Tums 500 Mg Chew (Calcium carbonate antacid) .... As needed 9)  Topiramate 100 Mg Tabs (Topiramate) .... Take 1 tab by mouth at bedtime 10)  Tobradex 0.3-0.1 % Susp (Tobramycin-dexamethasone) .... As directed 11)  Folic Acid 1 Mg Tabs (Folic acid) .... One by mouth once daily 12)  Ambien 10 Mg Tabs (Zolpidem tartrate) .... One by mouth at bedtime as needed for insomnia  Patient Instructions: 1)  Please schedule a follow-up appointment in 3 months. 2)  Tobacco is very bad for your health and your loved ones! You  Should stop smoking!. 3)  Stop Smoking Tips: Choose a Quit date. Cut down before the Quit date. decide what you will do as a substitute when you feel the urge to smoke(gum,toothpick,exercise). 4)  It is important that you exercise regularly at least 20 minutes 5 times a week. If you develop chest pain, have severe difficulty breathing, or feel very tired , stop exercising immediately and seek medical attention. 5)  You need to lose weight. Consider a lower calorie diet and regular exercise.  Prescriptions: AMBIEN 10 MG TABS (ZOLPIDEM TARTRATE) One by mouth at bedtime as needed for insomnia  #30 x 5   Entered and Authorized by:   Etta Grandchild MD   Signed by:   Etta Grandchild MD on 09/26/2010   Method used:   Print then Give to Patient   RxID:   [id number] PROZAC 20 MG CAPS (FLUOXETINE HCL) Take 1 tablet by mouth three times a day  #90 x 11   Entered and Authorized by:   Etta Grandchild MD   Signed by:   Etta Grandchild MD on 09/26/2010   Method used:   Print then Give to Patient   RxID:   [id number]    Medication Administration  Injection # 1:    Medication: Vit B12 1000 mcg    Diagnosis: B12 DEFICIENCY (ICD-266.2)    Route: IM    Site: L deltoid    Exp Date: 05/2012    Lot #: [id number]    Mfr: [employer]ent    Patient tolerated injection without complications    Given by: Rock Nephew CMA (September 26, 2010 8:35 AM)  Orders Added: 1)  Admin of Therapeutic Inj  intramuscular or subcutaneous [96372] 2)  Vit B12 1000 mcg [J3420] 3)  TLB-B12 + Folate Pnl [82746_82607-B12/FOL] 4)  TLB-BMP (Basic Metabolic Panel-BMET) [80048-METABOL] 5)  TLB-Magnesium (Mg) [83735-MG] 6)  Est. Patient Level III [02585]     Medication Administration  Injection # 1:    Medication: Vit B12 1000 mcg    Diagnosis: B12 DEFICIENCY (ICD-266.2)    Route: IM    Site: L deltoid    Exp Date: 05/2012    Lot #: [id number]    Mfr: [employer] Regent    Patient tolerated injection without  complications    Given by: Rock Nephew CMA (September 26, 2010 8:35 AM)  Orders Added: 1)  Admin of Therapeutic Inj  intramuscular or subcutaneous [96372] 2)  Vit B12 1000 mcg [J3420] 3)  TLB-B12 + Folate Pnl [82746_82607-B12/FOL] 4)  TLB-BMP (Basic Metabolic Panel-BMET) [80048-METABOL] 5)  TLB-Magnesium (Mg) [83735-MG] 6)  Est. Patient Level III [04540]

## 2010-10-26 ENCOUNTER — Ambulatory Visit: Payer: Medicare Other

## 2010-11-14 ENCOUNTER — Encounter: Payer: Self-pay | Admitting: Internal Medicine

## 2010-11-14 ENCOUNTER — Telehealth: Payer: Self-pay | Admitting: *Deleted

## 2010-11-14 ENCOUNTER — Ambulatory Visit (INDEPENDENT_AMBULATORY_CARE_PROVIDER_SITE_OTHER): Payer: Medicare Other | Admitting: Internal Medicine

## 2010-11-14 VITALS — BP 100/78 | HR 89 | Temp 98.5°F | Wt 239.4 lb

## 2010-11-14 DIAGNOSIS — J209 Acute bronchitis, unspecified: Secondary | ICD-10-CM

## 2010-11-14 MED ORDER — METHYLPREDNISOLONE ACETATE 80 MG/ML IJ SUSP
120.0000 mg | Freq: Once | INTRAMUSCULAR | Status: AC
  Administered 2010-11-14: 120 mg via INTRAMUSCULAR

## 2010-11-14 MED ORDER — AZITHROMYCIN 250 MG PO TABS: ORAL_TABLET | ORAL | Status: AC

## 2010-11-14 NOTE — Telephone Encounter (Signed)
Patient c/o chest congestion, non productive cough, possible fever, sweats and sinus congestion x 5 days. I advised f/u OV with with MD for eval and transferred to schedulers.

## 2010-11-14 NOTE — Patient Instructions (Signed)
It was good to see you today. Congrats on being done smoking - stay off the cigarettes even after you feel better! Zpack antibiotics and steroid shot today for cough and wheeze - Your prescription(s) have been submitted to your pharmacy. Please take as directed and contact our office if you believe you are having problem(s) with the medication(s). Continue spiriva and rescue inhaler as ongoing Please keep scheduled followup with Dr. Yetta Barre as planned, call sooner if problems.

## 2010-11-14 NOTE — Progress Notes (Signed)
  Subjective:     Danielle Holt is a 55 y.o. female who presents for evaluation of symptoms of a URI. Symptoms include achiness, congestion, cough described as barking and worsening over time and low grade fever. Onset of symptoms was 5 days ago, and has been gradually worsening since that time. Treatment to date: cough suppressants.  The following portions of the patient's history were reviewed and updated as appropriate: allergies, current medications, past family history, past medical history, past social history and problem list.  Review of Systems Constitutional: negative for sweats and weight loss Respiratory: negative for hemoptysis, pleurisy/chest pain and sputum Cardiovascular: negative for chest pressure/discomfort, irregular heart beat and lower extremity edema   Objective:    BP 100/78  Pulse 89  Temp(Src) 98.5 F (36.9 C) (Oral)  Wt 239 lb 6.4 oz (108.591 kg)  SpO2 97% General appearance: alert and mild distress Ears: normal TM's and external ear canals both ears Nose: Nares normal. Septum midline. Mucosa normal. No drainage or sinus tenderness. Throat: lips, mucosa, and tongue normal; teeth and gums normal Lungs: rhonchi bibasilar and wheezes LLL Heart: regular rate and rhythm, S1, S2 normal, no murmur, click, rub or gallop   Assessment:    bronchitis and viral upper respiratory illness   Plan:    Discussed diagnosis and treatment of URI. Suggested symptomatic OTC remedies. Nasal saline spray for congestion. Zithromax per orders. Call in 10 days if symptoms aren't resolving.  Also depo medrol for associated bronchospam

## 2010-11-25 NOTE — Discharge Summary (Signed)
[facility]  Patient:    Danielle Holt, Danielle Holt Visit Number: [id number] MRN: [id number]          Service Type: PSY Location: [id number] Attending Physician:  Jeanice Lim Dictated by:   Jeanice Lim, M.D. Admit Date:  10/11/2001 Discharge Date: 10/21/2001                             Discharge Summary  IDENTIFYING DATA:  This is a 55 year old African-American female voluntarily admitted, reporting auditory hallucinations.  ADMISSION MEDICATIONS:  Albuterol.  ALLERGIES:  No known drug allergies.  PHYSICAL EXAMINATION:  GENERAL:  Essentially within normal limits; performed in the emergency department.  NEUROLOGIC:  Nonfocal.  ROUTINE ADMISSION LABORATORY DATA:  Within normal limits.  MENTAL STATUS EXAMINATION:  Slightly overweight black female with fair eye contact.  Speech: Somewhat slow.  Thought process: Goal directed. Thought content: Positive for auditory hallucinations, somewhat distressing. Cognitive: Intact.  Judgment and insight: Limited.  ADMITTING DIAGNOSES: Axis I:    1. Major depressive disorder, recurrent, severe with psychotic               features.            2. Alcohol dependence. Axis II:   None. Axis III:  None. Axis IV:   Moderate problems with primary support system. Axis V:    30/60  HOSPITAL COURSE:  The patient was admitted and ordered routine p.r.n. medications and placed on a phenobarbital detoxification protocol for safe withdrawal.  The patient was also placed on Effexor, Seroquel, and amitriptyline as well as trazodone p.r.n.  The patient was given K-Dur for replacement of potassium and albuterol inhaler.  Seroquel was optimized as tolerated as well as Effexor, targeting depressive symptoms and mood lability.  The patient tolerated medication changes, reporting continued auditory hallucinations.  Loxitane was added for acute psychotic symptoms.  The patient reported positive response and no side  effects.  CONDITION AT DISCHARGE:  Improved.  Mood: More euthymic.  Affect: Bright. Thought process: Goal directed.  Thought content: Negative for dangerous ideation or psychotic symptoms.  The patient no longer was hearing voices and reported motivation to be compliant with the followup plan.  DISCHARGE MEDICATIONS: 1. Ventolin inhaler two puffs q.6h. p.r.n. 2. Effexor XR 75 mg two q.a.m. and one at 3 p.m. 3. K-Dur one q.a.m. 4. Seroquel 200 mg three q.h.s. 5. Loxapine 10 mg at 3 p.m. and q.h.s. 6. Elavil 25 mg q.h.s.  FOLLOWUP:  [facility] on April 16 at 10 a.m.  DISCHARGE DIAGNOSES: Axis I:    1. Major depressive disorder, recurrent, severe with psychotic               features.            2. Alcohol dependence. Axis II:   None. Axis III:  None. Axis IV:   Moderate problems with primary support system. Axis V:    Global assessment of functioning on discharge was 55. Dictated by:   Jeanice Lim, M.D. Attending Physician:  Jeanice Lim DD:  11/20/01 TD:  11/22/01 Job: [id number]

## 2010-12-11 IMAGING — CR DG KNEE COMPLETE 4+V*R*
4 series · 4 of 4 positions shown · non-contrast
Comparison: None

CLINICAL DATA: The medial knee pain.

RIGHT KNEE - COMPLETE 4+ VIEW

[view not recorded (1 of 4)]
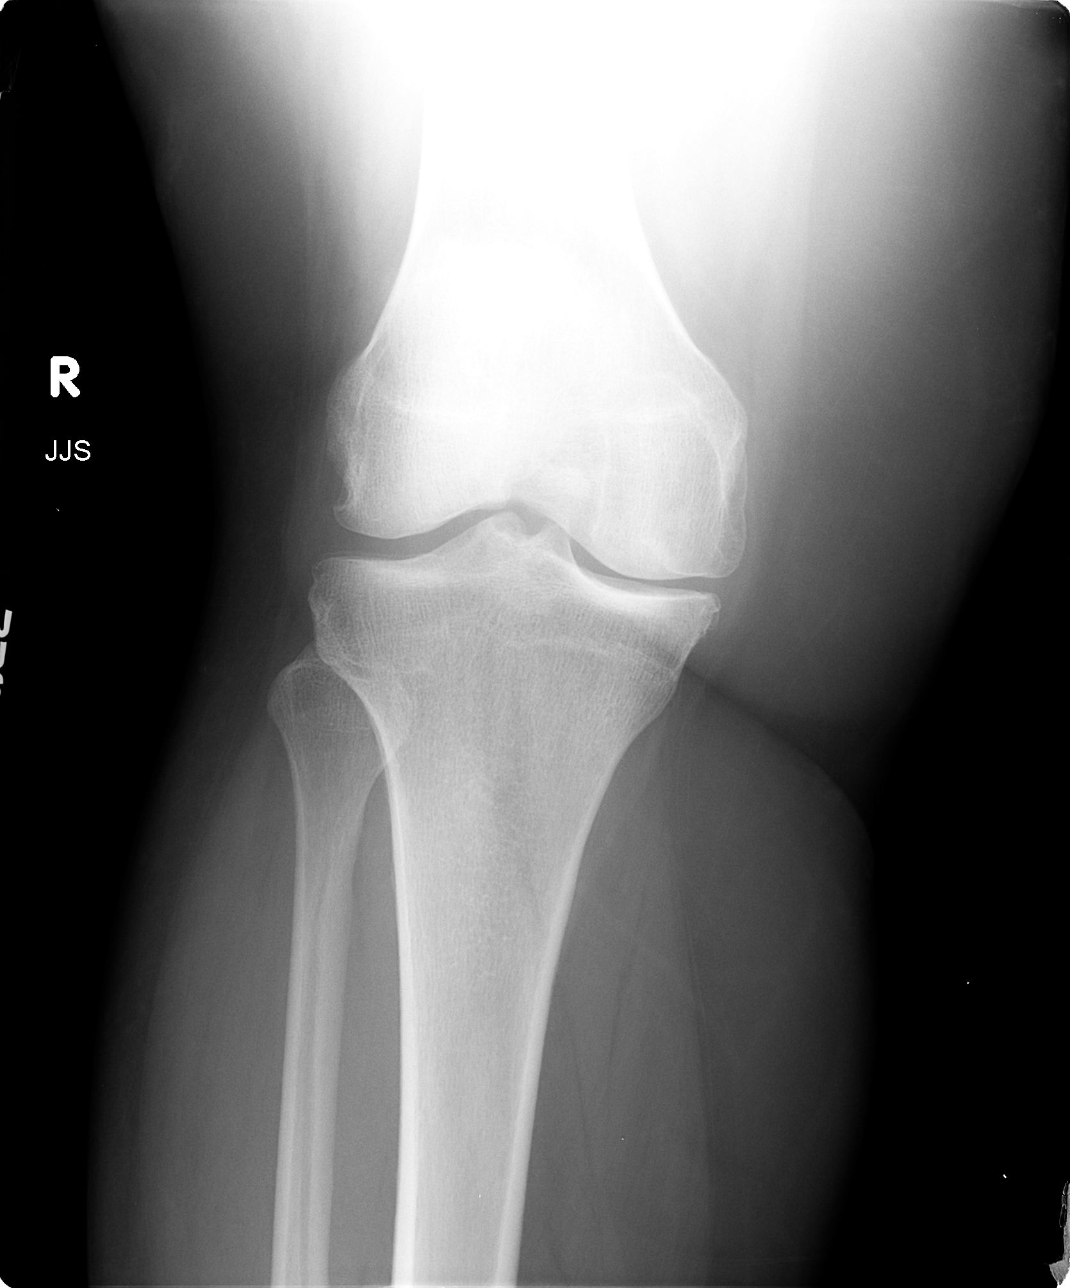

[view not recorded (2 of 4)]
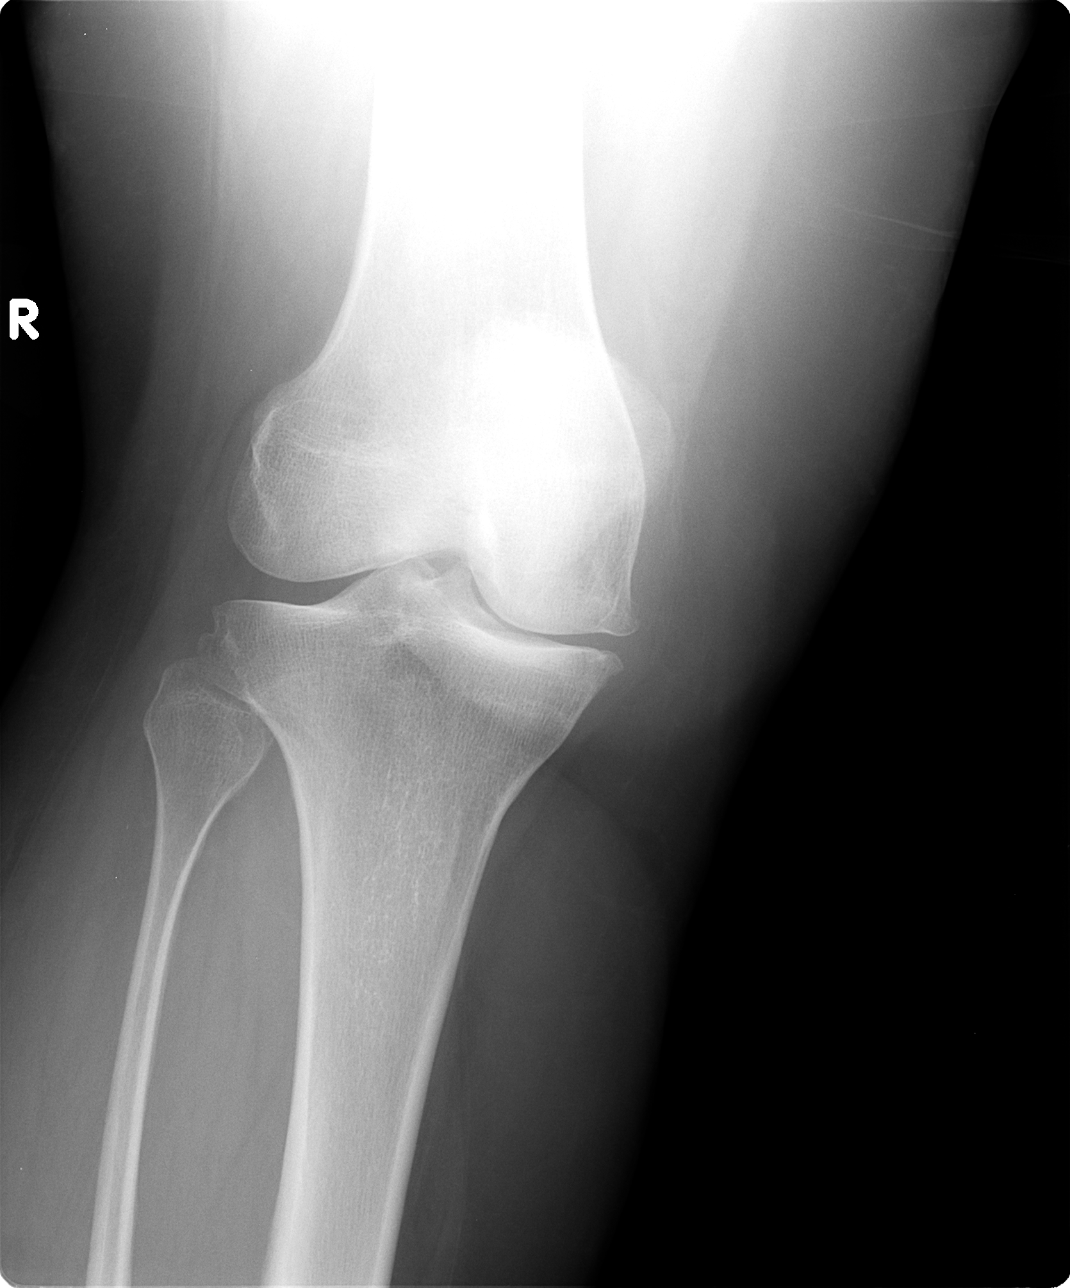

[view not recorded (3 of 4)]
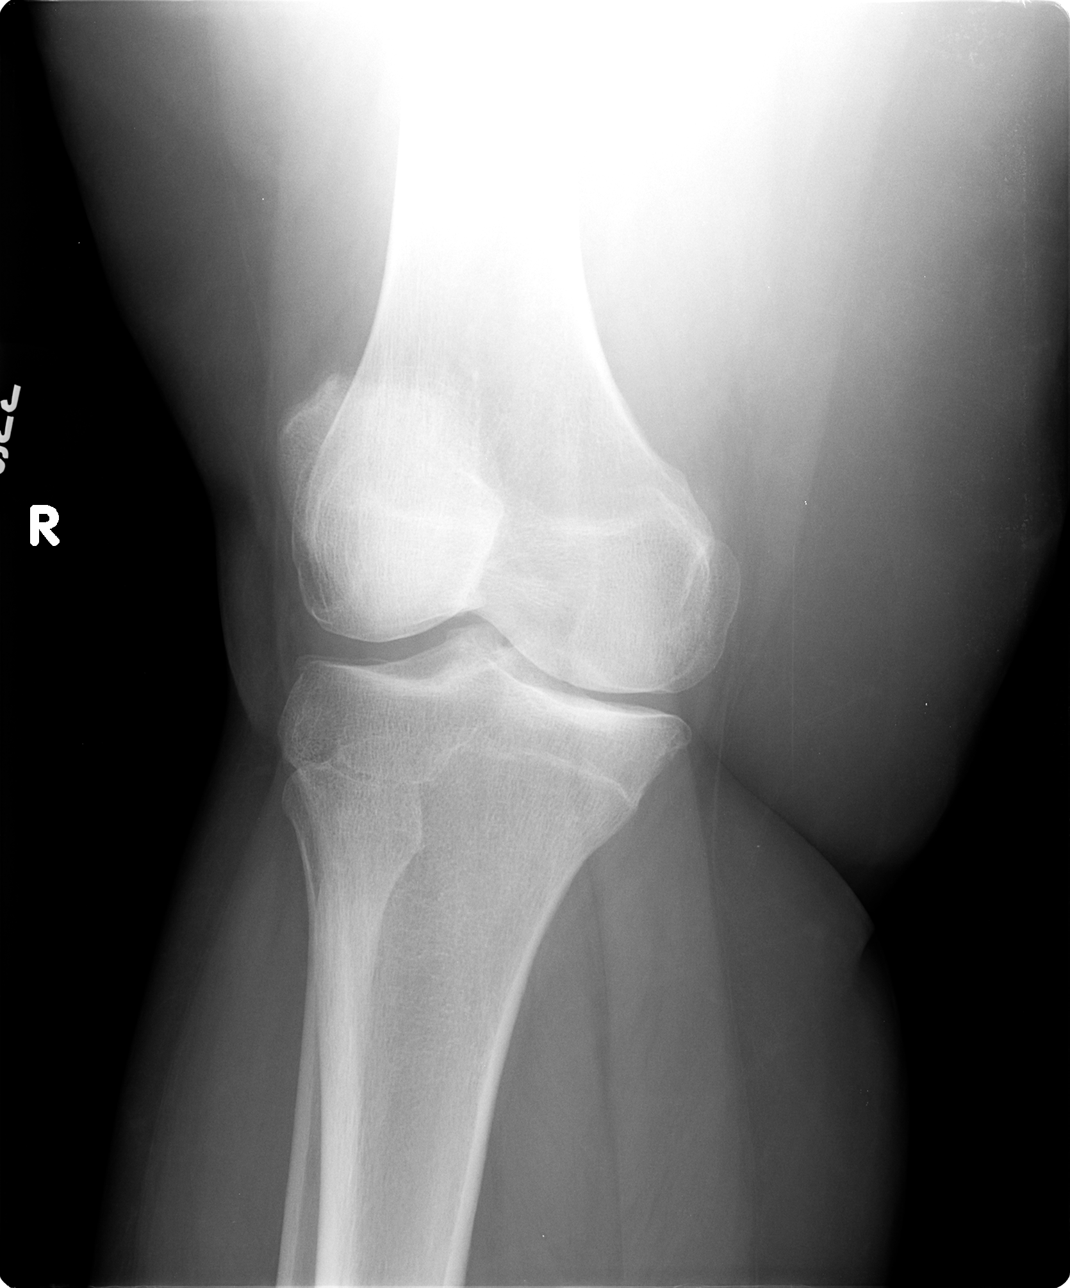

[view not recorded (4 of 4)]
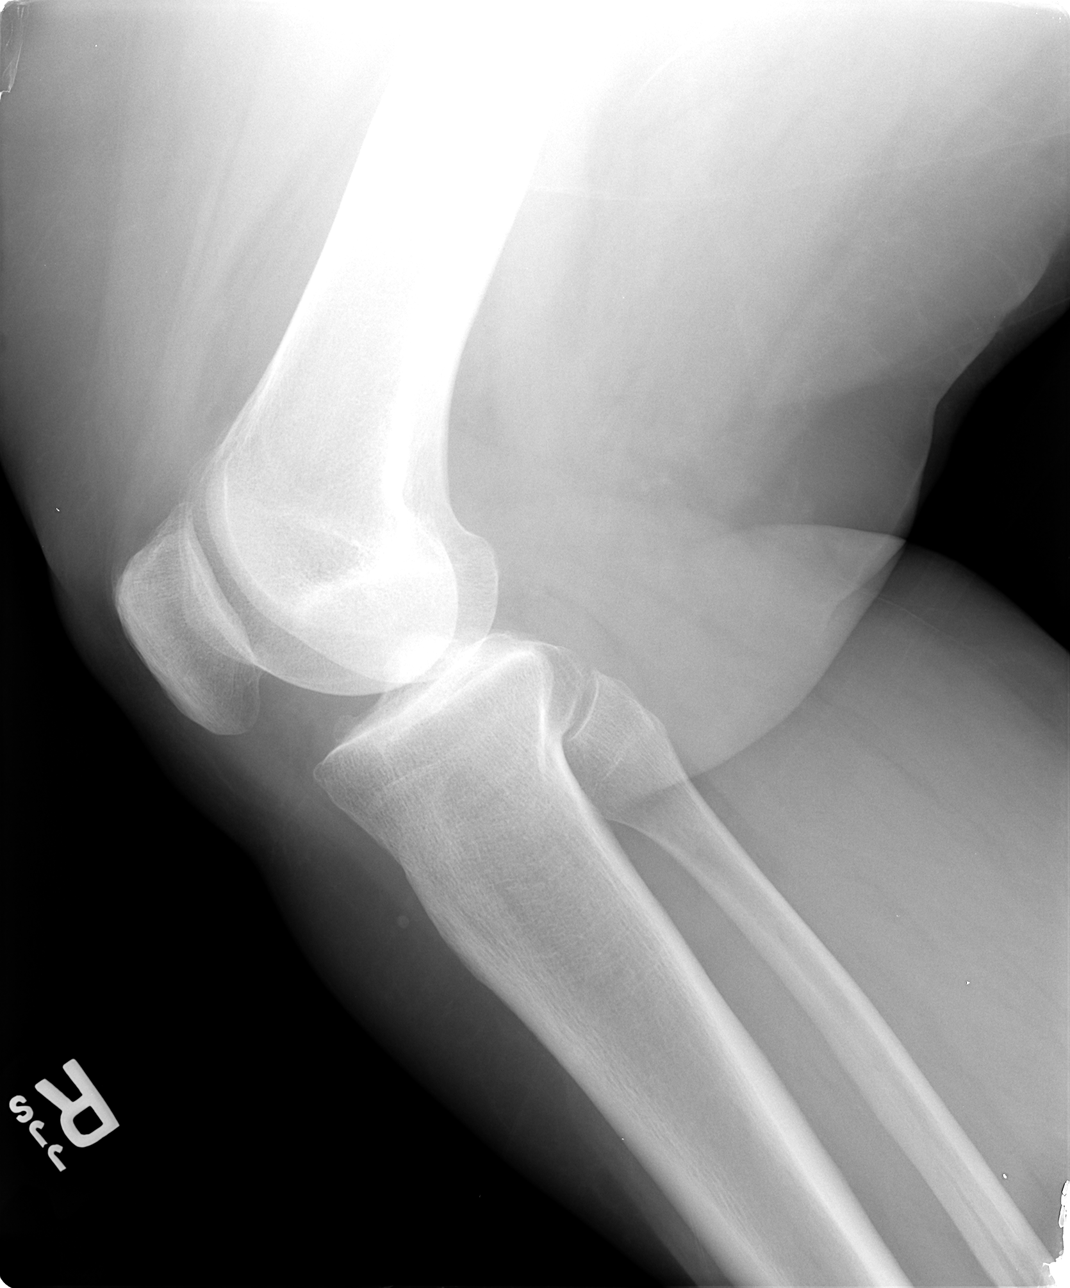

[4 of 4 positions shown; findings below may reference images not displayed]

FINDINGS: There are mild tricompartmental degenerative changes with
joint space narrowing, osteophytic spurring and peaking of the
tibial spines.  No acute fracture or osteochondral abnormality.  No
joint effusion.
IMPRESSION: Degenerative changes, advance for age but no acute bony findings.

## 2010-12-23 ENCOUNTER — Encounter: Payer: Self-pay | Admitting: Internal Medicine

## 2010-12-26 ENCOUNTER — Ambulatory Visit (INDEPENDENT_AMBULATORY_CARE_PROVIDER_SITE_OTHER): Payer: Medicare Other | Admitting: Internal Medicine

## 2010-12-26 ENCOUNTER — Encounter: Payer: Self-pay | Admitting: Internal Medicine

## 2010-12-26 DIAGNOSIS — J449 Chronic obstructive pulmonary disease, unspecified: Secondary | ICD-10-CM

## 2010-12-26 DIAGNOSIS — M129 Arthropathy, unspecified: Secondary | ICD-10-CM

## 2010-12-26 DIAGNOSIS — F329 Major depressive disorder, single episode, unspecified: Secondary | ICD-10-CM

## 2010-12-26 DIAGNOSIS — E538 Deficiency of other specified B group vitamins: Secondary | ICD-10-CM

## 2010-12-26 DIAGNOSIS — G4700 Insomnia, unspecified: Secondary | ICD-10-CM

## 2010-12-26 MED ORDER — QUETIAPINE FUMARATE 100 MG PO TABS: 100.0000 mg | ORAL_TABLET | Freq: Two times a day (BID) | ORAL | Status: DC

## 2010-12-26 MED ORDER — CYANOCOBALAMIN 1000 MCG/ML IJ SOLN
1000.0000 ug | Freq: Once | INTRAMUSCULAR | Status: AC
  Administered 2010-12-26: 1000 ug via INTRAMUSCULAR

## 2010-12-26 MED ORDER — IBUPROFEN 800 MG PO TABS: 800.0000 mg | ORAL_TABLET | Freq: Three times a day (TID) | ORAL | Status: DC | PRN

## 2010-12-26 NOTE — Assessment & Plan Note (Signed)
She is doing well on her current meds but has started smoking again

## 2010-12-26 NOTE — Assessment & Plan Note (Signed)
B12 injection today and continue B12 inj monthly

## 2010-12-26 NOTE — Assessment & Plan Note (Signed)
Continue prozac and seroquel

## 2010-12-26 NOTE — Progress Notes (Signed)
Subjective:    Patient ID: Danielle Holt, female    DOB: 1955/10/25, 55 y.o.   MRN: [id number]  HPI She returns for f/up today and she tells me that has been depressed lately and has had some trouble sleeping so she has been taking a friend's supply of seroquel 200 mg at HS and she has had a very good response to it, she wants her own supply.  She is not having any respiratory symptoms but has started smoking again.  She needs a refill of Motrin for arthritis pain.   Review of Systems  HENT: Negative.   Eyes: Negative.   Respiratory: Negative for apnea, cough, choking, chest tightness, shortness of breath, wheezing and stridor.   Cardiovascular: Negative for chest pain, palpitations and leg swelling.  Gastrointestinal: Negative for nausea, abdominal pain, diarrhea, constipation and anal bleeding.  Genitourinary: Negative for dysuria, urgency, frequency, hematuria, flank pain, decreased urine volume, enuresis, difficulty urinating and dyspareunia.  Musculoskeletal: Positive for arthralgias. Negative for myalgias, back pain, joint swelling and gait problem.  Skin: Negative for color change, pallor and rash.  Neurological: Negative for dizziness, tremors, seizures, syncope, facial asymmetry, speech difficulty, weakness, light-headedness, numbness and headaches.  Hematological: Negative for adenopathy. Does not bruise/bleed easily.  Psychiatric/Behavioral: Positive for sleep disturbance and dysphoric mood. Negative for suicidal ideas, hallucinations, behavioral problems, confusion, self-injury, decreased concentration and agitation. The patient is nervous/anxious. The patient is not hyperactive.        Objective:   Physical Exam  Nursing note and vitals reviewed. Constitutional: She is oriented to person, place, and time. She appears well-developed and well-nourished. No distress.  HENT:  Head: Normocephalic and atraumatic.  Right Ear: External ear normal.  Left Ear: External ear  normal.  Nose: Nose normal.  Mouth/Throat: Oropharynx is clear and moist. No oropharyngeal exudate.  Eyes: Conjunctivae and EOM are normal. Pupils are equal, round, and reactive to light. Right eye exhibits no discharge. Left eye exhibits no discharge. No scleral icterus.  Neck: Normal range of motion. Neck supple. No JVD present. No tracheal deviation present. No thyromegaly present.  Cardiovascular: Normal rate, regular rhythm, normal heart sounds and intact distal pulses.  Exam reveals no gallop and no friction rub.   No murmur heard. Pulmonary/Chest: Effort normal and breath sounds normal. No stridor. No respiratory distress. She has no wheezes. She has no rales. She exhibits no tenderness.  Abdominal: Soft. Bowel sounds are normal. She exhibits no distension and no mass. There is no tenderness. There is no rebound and no guarding.  Musculoskeletal: Normal range of motion. She exhibits no edema and no tenderness.  Lymphadenopathy:    She has no cervical adenopathy.  Neurological: She is alert and oriented to person, place, and time. She has normal reflexes. She displays normal reflexes. No cranial nerve deficit. She exhibits normal muscle tone. Coordination normal.  Skin: Skin is warm and dry. No rash noted. She is not diaphoretic. No erythema. No pallor.  Psychiatric: She has a normal mood and affect. Her behavior is normal. Judgment and thought content normal.        Lab Results  Component Value Date   WBC 6.7 07/25/2010   HGB 12.1 07/25/2010   HCT 36.2 07/25/2010   PLT 244.0 07/25/2010   CHOL 208* 07/25/2010   TRIG 88.0 07/25/2010   HDL 63.00 07/25/2010   LDLDIRECT 138.1 07/25/2010   ALT 18 07/25/2010   AST 19 07/25/2010   NA 137 09/26/2010   K 4.4 09/26/2010  CL 105 09/26/2010   CREATININE 0.8 09/26/2010   BUN 11 09/26/2010   CO2 26 09/26/2010   TSH 1.66 07/25/2010    Assessment & Plan:

## 2010-12-26 NOTE — Patient Instructions (Signed)

## 2011-01-25 ENCOUNTER — Encounter: Payer: Self-pay | Admitting: Internal Medicine

## 2011-01-25 ENCOUNTER — Ambulatory Visit (INDEPENDENT_AMBULATORY_CARE_PROVIDER_SITE_OTHER): Payer: Medicare Other | Admitting: Internal Medicine

## 2011-01-25 DIAGNOSIS — E785 Hyperlipidemia, unspecified: Secondary | ICD-10-CM

## 2011-01-25 DIAGNOSIS — G4700 Insomnia, unspecified: Secondary | ICD-10-CM

## 2011-01-25 DIAGNOSIS — J4489 Other specified chronic obstructive pulmonary disease: Secondary | ICD-10-CM

## 2011-01-25 DIAGNOSIS — E538 Deficiency of other specified B group vitamins: Secondary | ICD-10-CM

## 2011-01-25 DIAGNOSIS — F3289 Other specified depressive episodes: Secondary | ICD-10-CM

## 2011-01-25 DIAGNOSIS — F329 Major depressive disorder, single episode, unspecified: Secondary | ICD-10-CM

## 2011-01-25 DIAGNOSIS — F17200 Nicotine dependence, unspecified, uncomplicated: Secondary | ICD-10-CM

## 2011-01-25 DIAGNOSIS — J45909 Unspecified asthma, uncomplicated: Secondary | ICD-10-CM

## 2011-01-25 DIAGNOSIS — J449 Chronic obstructive pulmonary disease, unspecified: Secondary | ICD-10-CM

## 2011-01-25 MED ORDER — ALBUTEROL SULFATE HFA 108 (90 BASE) MCG/ACT IN AERS: 2.0000 | INHALATION_SPRAY | Freq: Four times a day (QID) | RESPIRATORY_TRACT | Status: DC | PRN

## 2011-01-25 MED ORDER — CYANOCOBALAMIN 1000 MCG/ML IJ SOLN
1000.0000 ug | Freq: Once | INTRAMUSCULAR | Status: AC
  Administered 2011-01-25: 1000 ug via INTRAMUSCULAR

## 2011-01-25 MED ORDER — QUETIAPINE FUMARATE 100 MG PO TABS: 300.0000 mg | ORAL_TABLET | Freq: Three times a day (TID) | ORAL | Status: AC

## 2011-01-25 MED ORDER — FLUTICASONE-SALMETEROL 250-50 MCG/DOSE IN AEPB: 1.0000 | INHALATION_SPRAY | Freq: Two times a day (BID) | RESPIRATORY_TRACT | Status: DC

## 2011-01-25 NOTE — Assessment & Plan Note (Signed)
This is in remission at the current dose of seroquel

## 2011-01-25 NOTE — Progress Notes (Signed)
Subjective:    Patient ID: Danielle Holt, female    DOB: 08/11/1955, 55 y.o.   MRN: [id number]  Asthma She complains of cough, shortness of breath and wheezing. There is no chest tightness, difficulty breathing, frequent throat clearing, hemoptysis, hoarse voice or sputum production. This is a recurrent problem. The current episode started more than 1 month ago. The problem occurs intermittently. The problem has been gradually worsening. The cough is non-productive, dry and paroxysmal. Pertinent negatives include no appetite change, chest pain, dyspnea on exertion, ear congestion, ear pain, fever, headaches, heartburn, malaise/fatigue, myalgias, nasal congestion, orthopnea, PND, postnasal drip, rhinorrhea, sneezing, sore throat, sweats, trouble swallowing or weight loss. Her symptoms are aggravated by pollen, change in weather and exposure to smoke. Her symptoms are alleviated by beta-agonist. She reports minimal improvement on treatment. Risk factors for lung disease include smoking/tobacco exposure. Her past medical history is significant for asthma and COPD. There is no history of bronchiectasis, bronchitis, emphysema or pneumonia.      Review of Systems  Constitutional: Negative for fever, chills, weight loss, malaise/fatigue, diaphoresis, activity change, appetite change, fatigue and unexpected weight change.  HENT: Negative for ear pain, sore throat, hoarse voice, facial swelling, rhinorrhea, sneezing, trouble swallowing, neck pain, neck stiffness, voice change and postnasal drip.   Eyes: Negative for photophobia and visual disturbance.  Respiratory: Positive for cough, shortness of breath and wheezing. Negative for apnea, hemoptysis, sputum production, choking, chest tightness and stridor.   Cardiovascular: Negative for chest pain, dyspnea on exertion, palpitations, leg swelling and PND.  Gastrointestinal: Negative for heartburn, nausea, vomiting, abdominal pain, diarrhea and anal  bleeding.  Genitourinary: Negative for dysuria, frequency, hematuria, flank pain, enuresis, difficulty urinating and dyspareunia.  Musculoskeletal: Negative for myalgias, back pain, joint swelling, arthralgias and gait problem.  Skin: Negative for color change, pallor, rash and wound.  Neurological: Negative for dizziness, tremors, seizures, syncope, facial asymmetry, speech difficulty, weakness, light-headedness, numbness and headaches.  Hematological: Negative for adenopathy. Does not bruise/bleed easily.  Psychiatric/Behavioral: Positive for dysphoric mood (she has increased her seruquel dose to 300 mg qhs and she feels like her trouble sleeping and insomnia have resolved). Negative for suicidal ideas, hallucinations, behavioral problems, confusion, sleep disturbance, self-injury, decreased concentration and agitation. The patient is not nervous/anxious and is not hyperactive.        Objective:   Physical Exam  Vitals reviewed. Constitutional: She is oriented to person, place, and time. She appears well-developed and well-nourished. No distress.  HENT:  Head: Normocephalic and atraumatic.  Right Ear: External ear normal.  Left Ear: External ear normal.  Nose: Nose normal.  Mouth/Throat: Oropharynx is clear and moist. No oropharyngeal exudate.  Eyes: Conjunctivae and EOM are normal. Pupils are equal, round, and reactive to light. Right eye exhibits no discharge. Left eye exhibits no discharge. No scleral icterus.  Neck: Normal range of motion. Neck supple. No JVD present. No tracheal deviation present. No thyromegaly present.  Cardiovascular: Normal rate, regular rhythm, normal heart sounds and intact distal pulses.  Exam reveals no gallop and no friction rub.   No murmur heard. Pulmonary/Chest: Effort normal and breath sounds normal. No stridor. No respiratory distress. She has no wheezes. She has no rales. She exhibits no tenderness.  Abdominal: Soft. Bowel sounds are normal. She  exhibits no distension and no mass. There is no tenderness. There is no rebound and no guarding.  Musculoskeletal: Normal range of motion. She exhibits no edema and no tenderness.  Lymphadenopathy:    She has  no cervical adenopathy.  Neurological: She is alert and oriented to person, place, and time. She has normal reflexes. She displays normal reflexes. No cranial nerve deficit. She exhibits normal muscle tone. Coordination normal.  Skin: Skin is warm and dry. No rash noted. She is not diaphoretic. No erythema. No pallor.  Psychiatric: She has a normal mood and affect. Her behavior is normal. Judgment and thought content normal.         Lab Results  Component Value Date   WBC 6.7 07/25/2010   HGB 12.1 07/25/2010   HCT 36.2 07/25/2010   PLT 244.0 07/25/2010   CHOL 208* 07/25/2010   TRIG 88.0 07/25/2010   HDL 63.00 07/25/2010   LDLDIRECT 138.1 07/25/2010   ALT 18 07/25/2010   AST 19 07/25/2010   NA 137 09/26/2010   K 4.4 09/26/2010   CL 105 09/26/2010   CREATININE 0.8 09/26/2010   BUN 11 09/26/2010   CO2 26 09/26/2010   TSH 1.66 07/25/2010   Assessment & Plan:

## 2011-01-25 NOTE — Patient Instructions (Signed)
Asthma, Adult Asthma is caused by narrowing of the air passages in the lungs. It may be triggered by pollen, dust, animal dander, molds, some foods, respiratory infections, exposure to smoke, exercise, emotional stress or other allergens (things that cause allergic reactions or allergies). Repeat attacks are common. HOME CARE INSTRUCTIONS  Use prescription medications as ordered by your caregiver.   Avoid pollen, dust, animal dander, molds, smoke and other things that cause attacks at home and at work.   You may have fewer attacks if you decrease dust in your home. Electrostatic air cleaners may help.   It may help to replace your pillows or mattress with materials less likely to cause allergies.   Talk to your caregiver about an action plan for managing asthma attacks at home, including, the use of a peak flow meter which measures the severity of your asthma attack. An action plan can help minimize or stop the attack without having to seek medical care.   If you are not on a fluid restriction, drink 8 to 10 glasses of water each day.   Always have a plan prepared for seeking medical attention, including, calling your physician, accessing local emergency care, and calling 911 (in the [place] for a severe attack.   Discuss possible exercise routines with your caregiver.   If animal dander is the cause of asthma, you may need to get rid of pets.  SEEK MEDICAL CARE IF:  You have wheezing and shortness of breath even if taking medicine to prevent attacks.   An oral temperature above 100.5 develops.   You have muscle aches, chest pain or thickening of sputum.   Your sputum changes from clear or white to yellow, green, gray or bloody.   You have any problems that may be related to the medicine you are taking (such as a rash, itching, swelling or trouble breathing).  SEEK IMMEDIATE MEDICAL CARE IF:  Your usual medicines do not stop your wheezing or there is increased coughing and/or  shortness of breath.   You have increased difficulty breathing.   You have an oral temperature above 100.5, not controlled by medicine.  MAKE SURE YOU:  Understand these instructions.   Will watch your condition.   Will get help right away if you are not doing well or get worse.  Document Released: 06/26/2005 Document Re-Released: 07/18/2009 [employer] Patient Information 2011 [employer]. 

## 2011-01-27 NOTE — Assessment & Plan Note (Signed)
She got a B12 injection today 

## 2011-01-27 NOTE — Assessment & Plan Note (Signed)
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

## 2011-01-27 NOTE — Assessment & Plan Note (Signed)
Start advair diskus 

## 2011-02-27 ENCOUNTER — Ambulatory Visit: Payer: Medicare Other

## 2011-03-07 ENCOUNTER — Ambulatory Visit: Payer: Medicare Other

## 2011-03-29 ENCOUNTER — Telehealth: Payer: Self-pay

## 2011-03-29 NOTE — Telephone Encounter (Signed)
Patient is requesting rx refill for  Ativan. Please advise is ok to refill Thanks

## 2011-03-29 NOTE — Telephone Encounter (Signed)
yes

## 2011-03-30 NOTE — Telephone Encounter (Signed)
Called patient no answer/ will try back later. Need to know if pharmacy is [facility] or [facility]

## 2011-03-31 ENCOUNTER — Ambulatory Visit: Payer: Medicare Other | Admitting: Internal Medicine

## 2011-03-31 DIAGNOSIS — Z0289 Encounter for other administrative examinations: Secondary | ICD-10-CM

## 2011-03-31 NOTE — Telephone Encounter (Signed)
Closing phone note, pt has appt 03/31/11

## 2011-05-09 ENCOUNTER — Telehealth: Payer: Self-pay | Admitting: *Deleted

## 2011-05-09 MED ORDER — LORAZEPAM 1 MG PO TABS: 1.0000 mg | ORAL_TABLET | Freq: Three times a day (TID) | ORAL | Status: DC | PRN

## 2011-05-09 NOTE — Telephone Encounter (Signed)
FYI - patient wants back brace, company will fax over form for MD to complete

## 2011-05-09 NOTE — Telephone Encounter (Signed)
Patient requesting RF of ativan, see previous phone note in September, [place] for RFs but pt never returned call to inform us where to call in RX. Pt needs RF at [facility], DONE

## 2011-05-24 ENCOUNTER — Ambulatory Visit (INDEPENDENT_AMBULATORY_CARE_PROVIDER_SITE_OTHER): Payer: Medicare Other | Admitting: Internal Medicine

## 2011-05-24 ENCOUNTER — Encounter: Payer: Self-pay | Admitting: Internal Medicine

## 2011-05-24 ENCOUNTER — Ambulatory Visit (INDEPENDENT_AMBULATORY_CARE_PROVIDER_SITE_OTHER)
Admission: RE | Admit: 2011-05-24 | Discharge: 2011-05-24 | Disposition: A | Discharge status: Home / Self Care | Payer: Medicare Other | Source: Ambulatory Visit | Attending: Internal Medicine | Admitting: Internal Medicine

## 2011-05-24 DIAGNOSIS — M25569 Pain in unspecified knee: Secondary | ICD-10-CM

## 2011-05-24 DIAGNOSIS — M25561 Pain in right knee: Secondary | ICD-10-CM

## 2011-05-24 DIAGNOSIS — Z23 Encounter for immunization: Secondary | ICD-10-CM

## 2011-05-24 DIAGNOSIS — E785 Hyperlipidemia, unspecified: Secondary | ICD-10-CM

## 2011-05-24 DIAGNOSIS — E538 Deficiency of other specified B group vitamins: Secondary | ICD-10-CM

## 2011-05-24 DIAGNOSIS — J45909 Unspecified asthma, uncomplicated: Secondary | ICD-10-CM

## 2011-05-24 IMAGING — CR DG KNEE COMPLETE 4+V*L*
4 series · 4 of 4 positions shown · non-contrast
Comparison: [DATE]

CLINICAL DATA: Medial right knee pain for 1 month.

LEFT KNEE - COMPLETE 4+ VIEW

[view not recorded (1 of 4)]
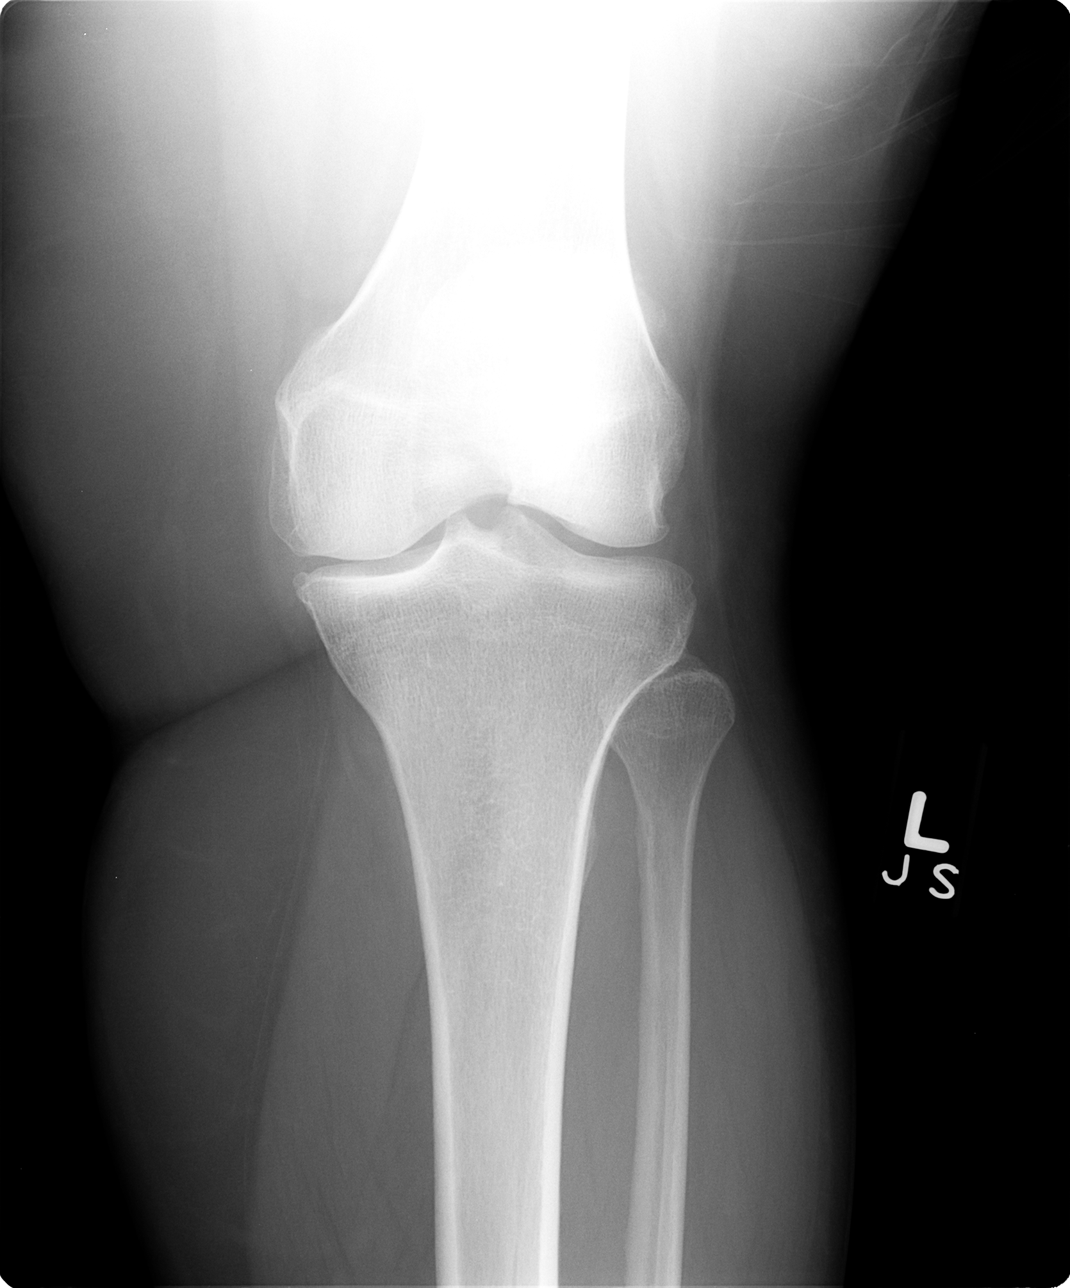

[view not recorded (2 of 4)]
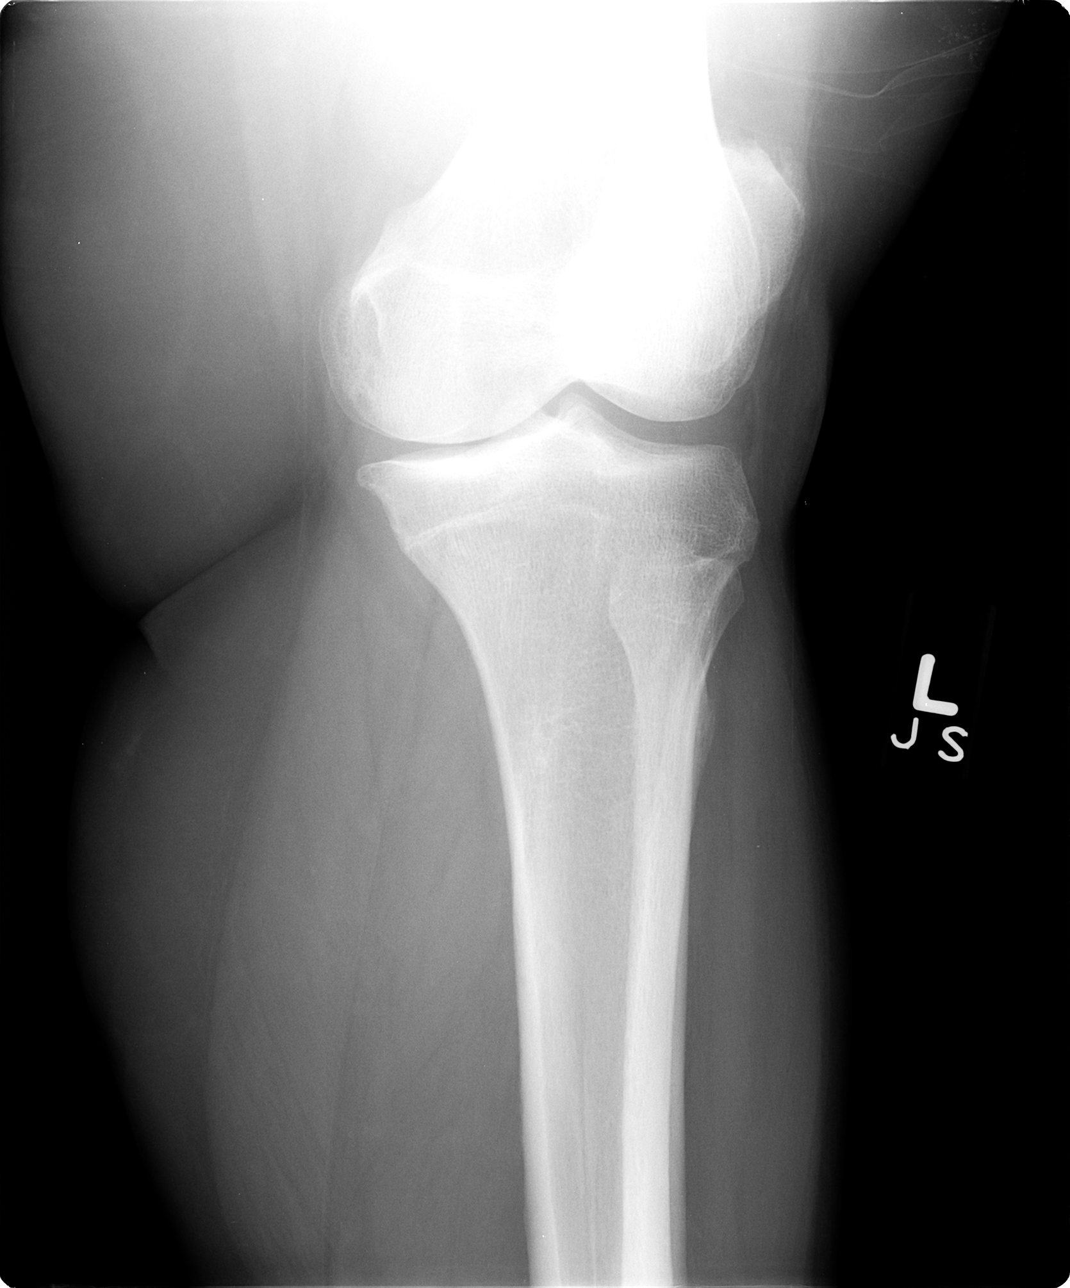

[view not recorded (3 of 4)]
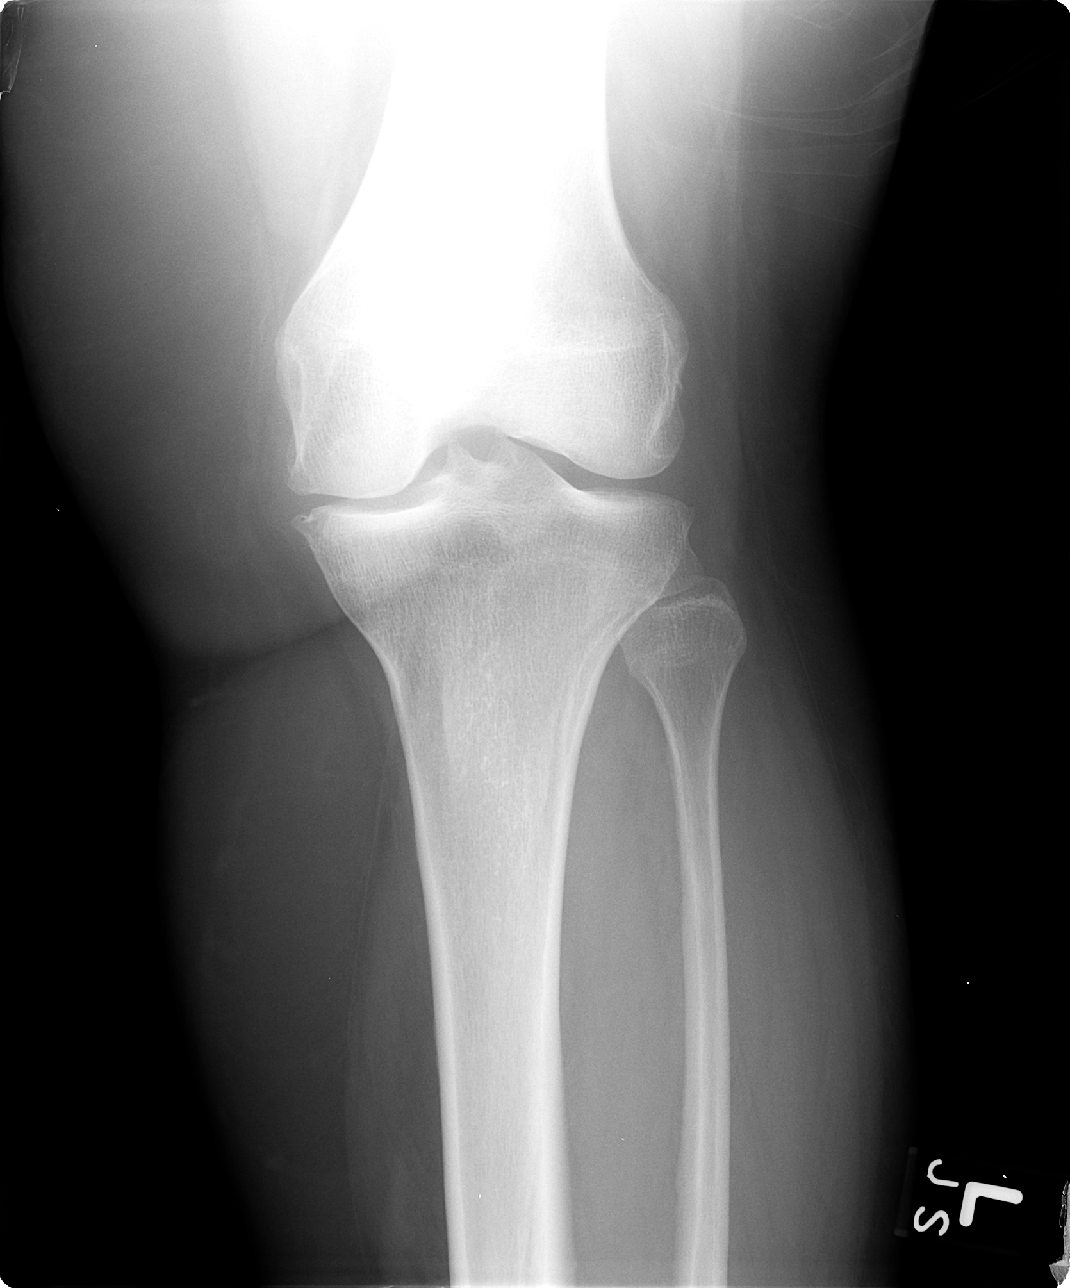

[view not recorded (4 of 4)]
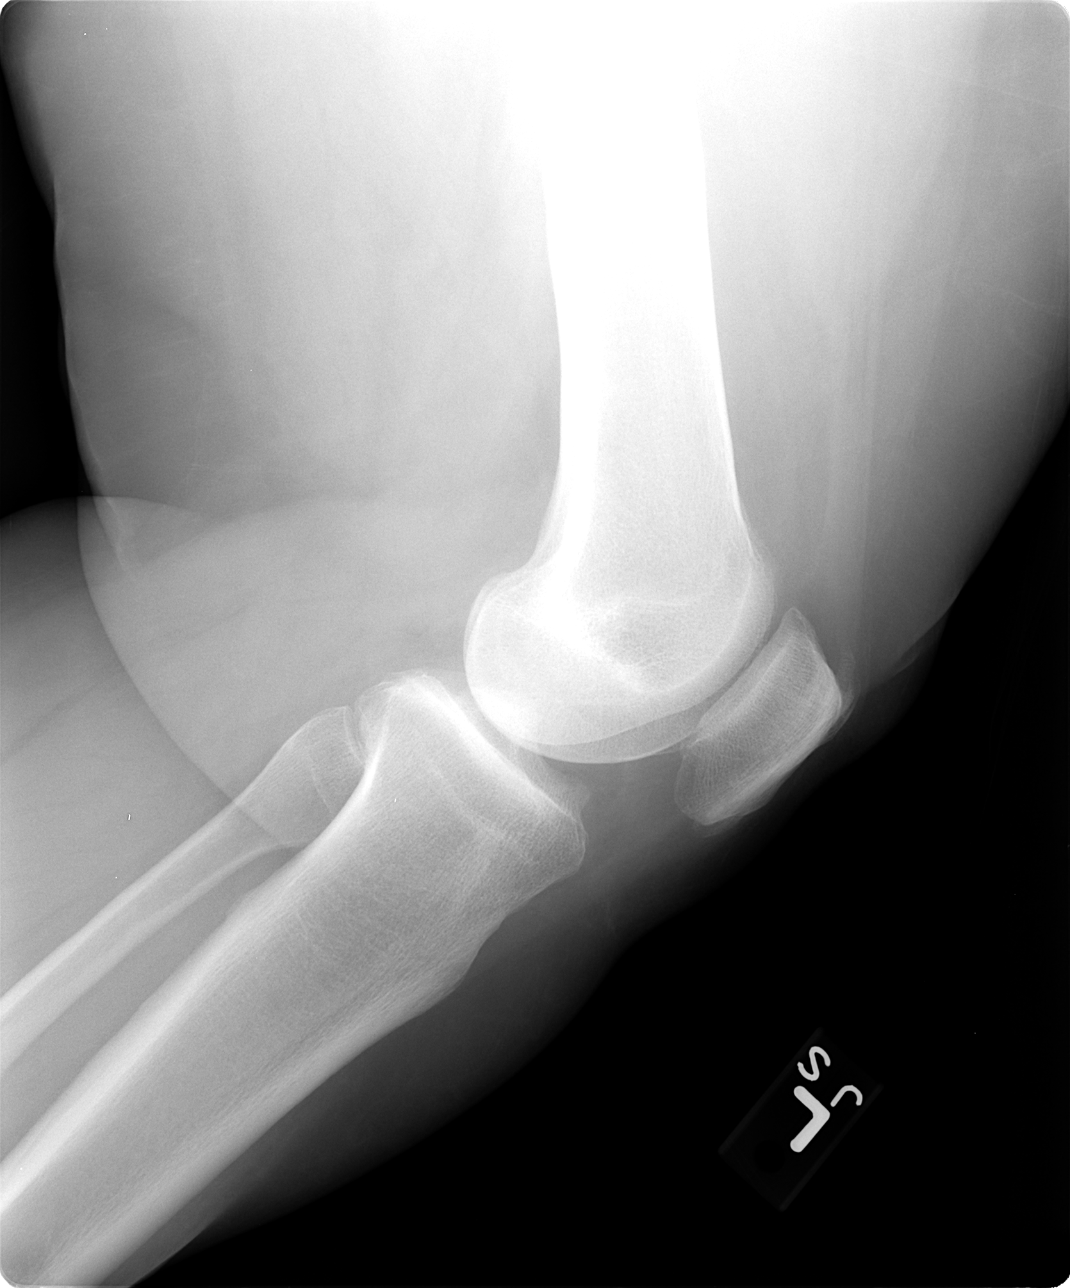

[4 of 4 positions shown; findings below may reference images not displayed]

FINDINGS: There is no fracture or dislocation or joint effusion.
There is slight medial joint space narrowing and marginal spur
formation, essentially unchanged since prior study of [DATE].
IMPRESSION: Mild arthritic changes of the medial compartment of the left knee,
unchanged since [DATE].

## 2011-05-24 MED ORDER — LEVOTHYROXINE SODIUM 13 MCG PO CAPS: 1.0000 | ORAL_CAPSULE | Freq: Every day | ORAL | Status: DC

## 2011-05-24 NOTE — Assessment & Plan Note (Signed)
This is well-controlled.

## 2011-05-24 NOTE — Progress Notes (Signed)
  Subjective:    Patient ID: Danielle Holt, female    DOB: Aug 19, 1955, 55 y.o.   MRN: [id number]  HPI She returns for f/up and she tells me that she wants to try low dose thyroid medication to see if it will help her lose weight. Also, she complains of worsening knee pain for several months, R>>L, but she has not sustained an injury.   Review of Systems  Constitutional: Positive for unexpected weight change (weight gain). Negative for fever, chills, diaphoresis, activity change, appetite change and fatigue.  HENT: Negative.   Eyes: Negative.   Respiratory: Negative for cough, shortness of breath, wheezing and stridor.   Cardiovascular: Negative for chest pain, palpitations and leg swelling.  Gastrointestinal: Negative for nausea, vomiting, abdominal pain, diarrhea and constipation.  Genitourinary: Negative for dysuria, urgency, frequency, hematuria, flank pain, decreased urine volume, enuresis, difficulty urinating and dyspareunia.  Musculoskeletal: Positive for arthralgias (both knees). Negative for myalgias, back pain, joint swelling and gait problem.  Skin: Negative for color change, pallor, rash and wound.  Neurological: Negative for dizziness, tremors, seizures, syncope, facial asymmetry, speech difficulty, weakness, light-headedness, numbness and headaches.  Hematological: Negative for adenopathy. Does not bruise/bleed easily.  Psychiatric/Behavioral: Negative.        Objective:   Physical Exam  Vitals reviewed. Constitutional: She is oriented to person, place, and time. She appears well-developed and well-nourished. No distress.  HENT:  Head: Normocephalic and atraumatic.  Mouth/Throat: Oropharynx is clear and moist. No oropharyngeal exudate.  Eyes: Conjunctivae are normal. Right eye exhibits no discharge. Left eye exhibits no discharge. No scleral icterus.  Neck: Normal range of motion. Neck supple. No JVD present. No tracheal deviation present. No thyromegaly present.    Cardiovascular: Normal rate, regular rhythm, normal heart sounds and intact distal pulses.  Exam reveals no gallop and no friction rub.   No murmur heard. Pulmonary/Chest: Effort normal and breath sounds normal. No stridor. No respiratory distress. She has no wheezes. She has no rales. She exhibits no tenderness.  Abdominal: Soft. Bowel sounds are normal. She exhibits no distension and no mass. There is no tenderness. There is no rebound and no guarding.  Musculoskeletal: Normal range of motion. She exhibits no edema and no tenderness.       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  Neurological: She is oriented to person, place, and time.  Skin: Skin is warm and dry. No rash noted. She is not diaphoretic. No erythema. No pallor.  Psychiatric: She has a normal mood and affect. Her behavior is normal. Judgment and thought content normal.      Lab Results  Component Value Date   WBC 6.7 07/25/2010   HGB 12.1 07/25/2010   HCT 36.2 07/25/2010   PLT 244.0 07/25/2010   GLUCOSE 99 09/26/2010   CHOL 208* 07/25/2010   TRIG 88.0 07/25/2010   HDL 63.00 07/25/2010   LDLDIRECT 138.1 07/25/2010   ALT 18 07/25/2010   AST 19 07/25/2010   NA 137 09/26/2010   K 4.4 09/26/2010   CL 105 09/26/2010   CREATININE 0.8 09/26/2010   BUN 11 09/26/2010   CO2 26 09/26/2010   TSH 1.66 07/25/2010      Assessment & Plan:

## 2011-05-24 NOTE — Patient Instructions (Signed)

## 2011-05-24 NOTE — Assessment & Plan Note (Signed)
I will check plain films of both knees

## 2011-05-24 NOTE — Assessment & Plan Note (Signed)
Check FLP, CMP, TSH

## 2011-05-24 NOTE — Assessment & Plan Note (Signed)
I will check her CBC

## 2011-05-24 NOTE — Assessment & Plan Note (Signed)
She will try low dose T replacement with Tirosint, I will look at her labs today

## 2011-06-09 ENCOUNTER — Emergency Department (HOSPITAL_COMMUNITY): Payer: Medicare Other

## 2011-06-09 ENCOUNTER — Encounter (HOSPITAL_COMMUNITY): Payer: Self-pay | Admitting: *Deleted

## 2011-06-09 ENCOUNTER — Emergency Department (HOSPITAL_COMMUNITY)
Admission: EM | Admit: 2011-06-09 | Discharge: 2011-06-09 | Disposition: A | Discharge status: Home / Self Care | Payer: Medicare Other | Attending: Emergency Medicine | Admitting: Emergency Medicine

## 2011-06-09 DIAGNOSIS — K219 Gastro-esophageal reflux disease without esophagitis: Secondary | ICD-10-CM | POA: Insufficient documentation

## 2011-06-09 DIAGNOSIS — M129 Arthropathy, unspecified: Secondary | ICD-10-CM | POA: Insufficient documentation

## 2011-06-09 DIAGNOSIS — J449 Chronic obstructive pulmonary disease, unspecified: Secondary | ICD-10-CM | POA: Insufficient documentation

## 2011-06-09 DIAGNOSIS — M545 Low back pain, unspecified: Secondary | ICD-10-CM | POA: Insufficient documentation

## 2011-06-09 DIAGNOSIS — M542 Cervicalgia: Secondary | ICD-10-CM | POA: Insufficient documentation

## 2011-06-09 DIAGNOSIS — J4489 Other specified chronic obstructive pulmonary disease: Secondary | ICD-10-CM | POA: Insufficient documentation

## 2011-06-09 DIAGNOSIS — E785 Hyperlipidemia, unspecified: Secondary | ICD-10-CM | POA: Insufficient documentation

## 2011-06-09 DIAGNOSIS — S139XXA Sprain of joints and ligaments of unspecified parts of neck, initial encounter: Secondary | ICD-10-CM | POA: Insufficient documentation

## 2011-06-09 DIAGNOSIS — S161XXA Strain of muscle, fascia and tendon at neck level, initial encounter: Secondary | ICD-10-CM

## 2011-06-09 DIAGNOSIS — F17200 Nicotine dependence, unspecified, uncomplicated: Secondary | ICD-10-CM | POA: Insufficient documentation

## 2011-06-09 DIAGNOSIS — M62838 Other muscle spasm: Secondary | ICD-10-CM | POA: Insufficient documentation

## 2011-06-09 DIAGNOSIS — M25569 Pain in unspecified knee: Secondary | ICD-10-CM | POA: Insufficient documentation

## 2011-06-09 IMAGING — CR DG LUMBAR SPINE COMPLETE 4+V
5 series · 5 of 5 positions shown · non-contrast
Comparison: None.

CLINICAL DATA: Recent fall with pain

LUMBAR SPINE - COMPLETE 4+ VIEW

[t lumbar spine ap]
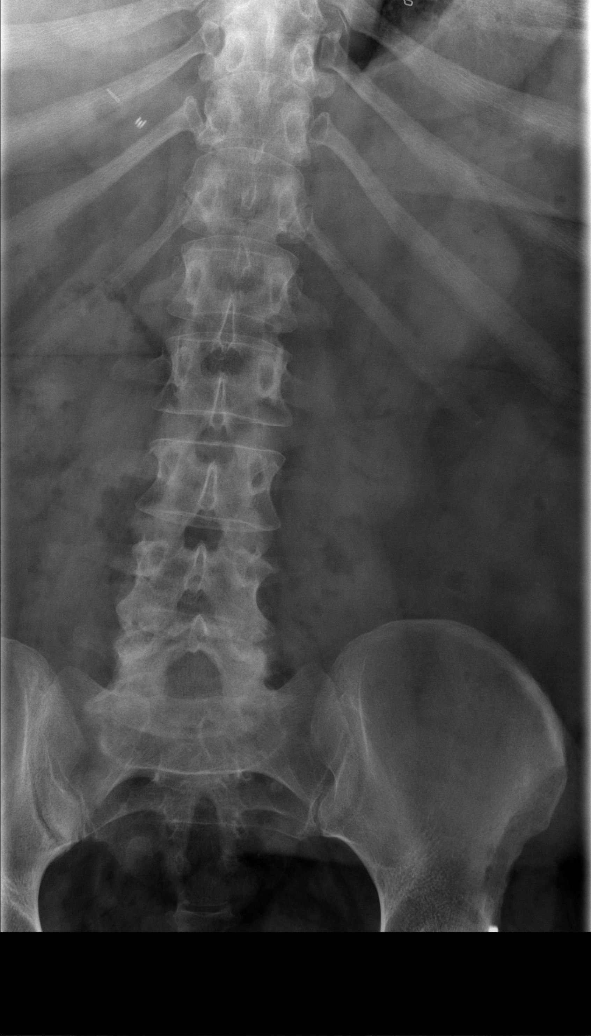

[t lumbar spine obl (1 of 2)]
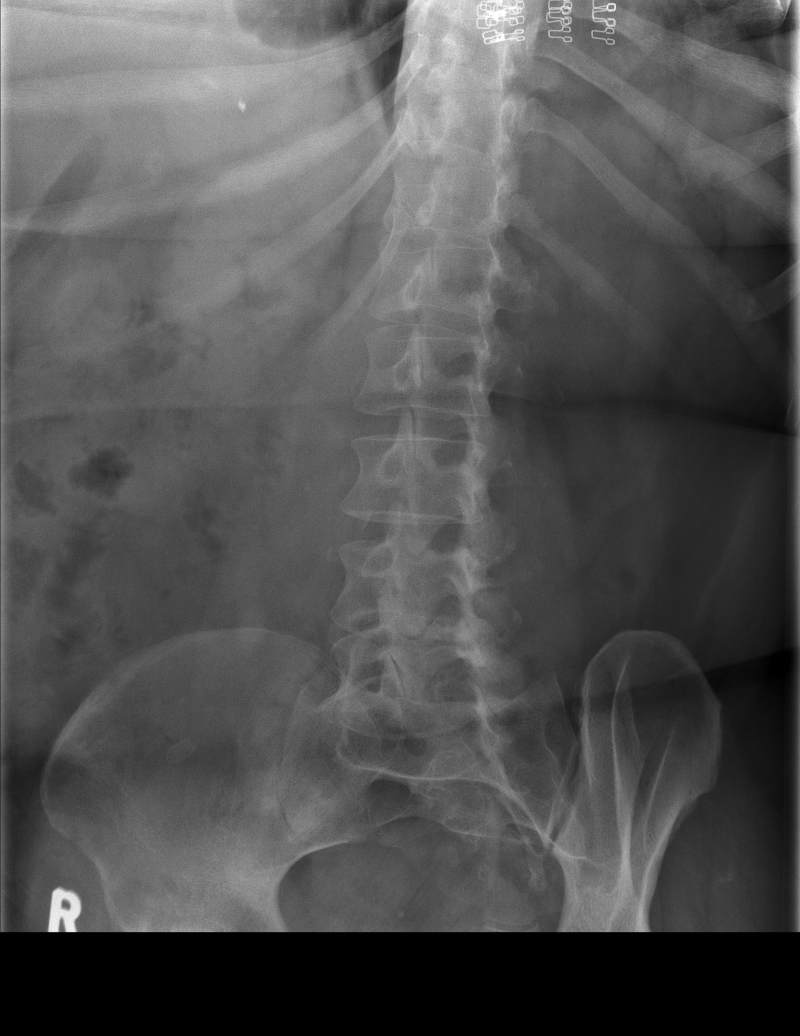

[t lumbar spine obl (2 of 2)]
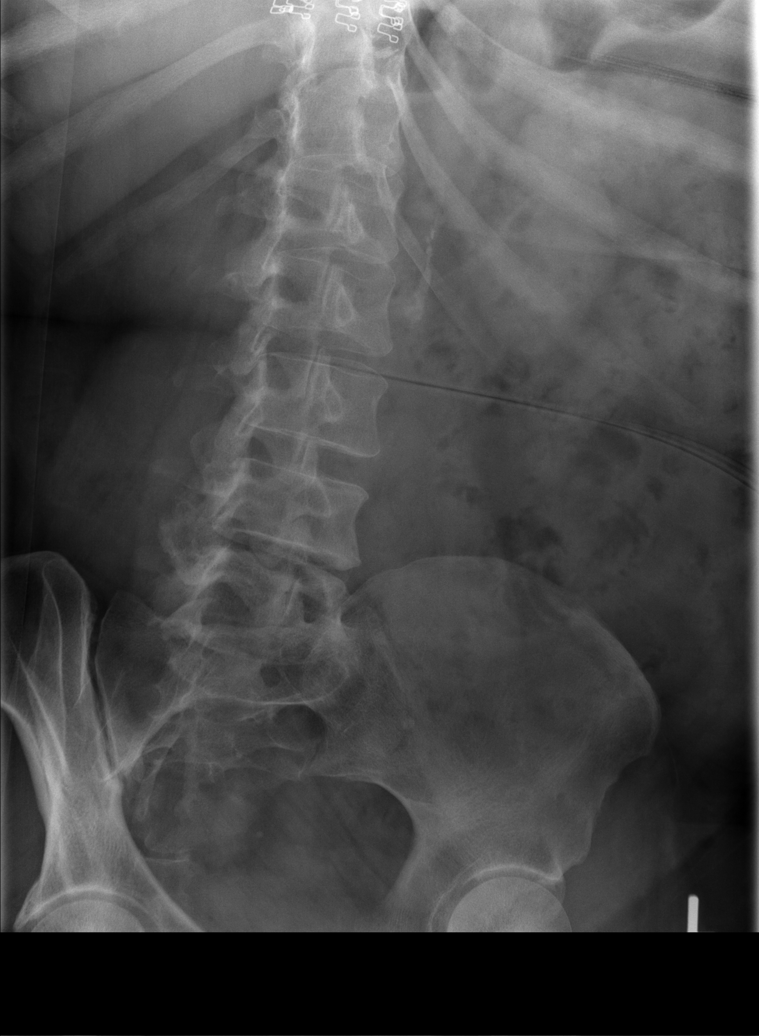

[t lumbar spine lat]
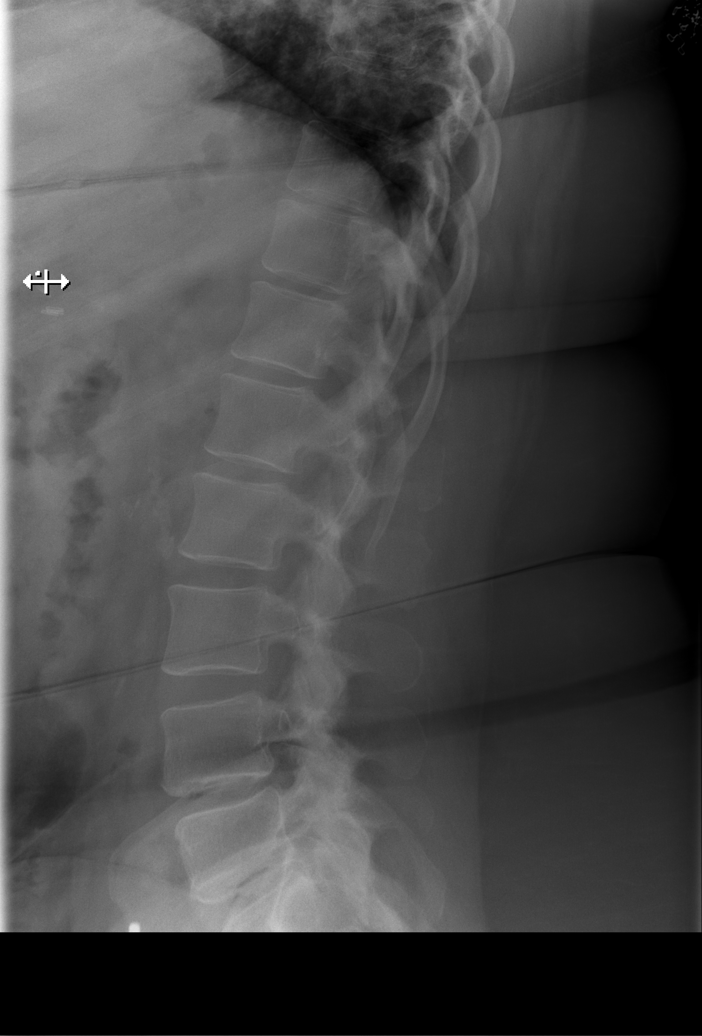

[t lumbar l-5 s-1 spot]
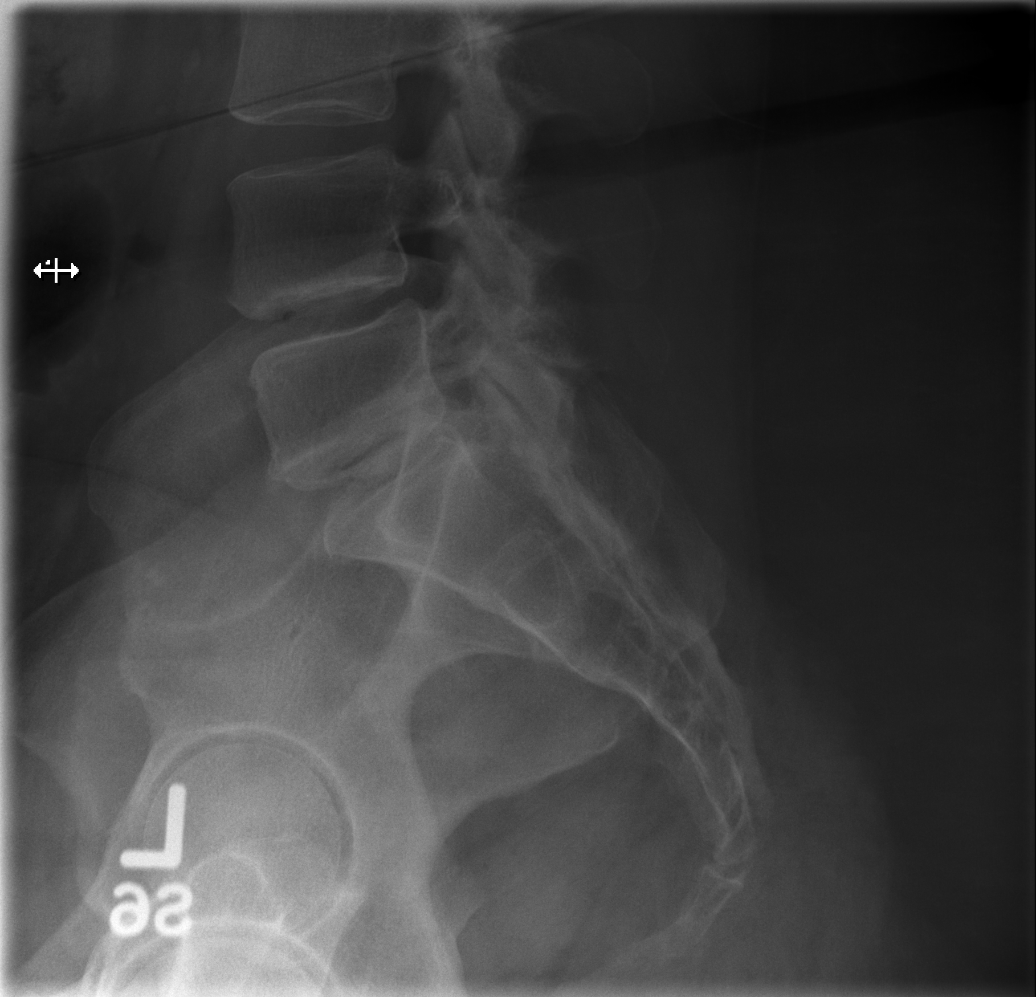

[5 of 5 positions shown; findings below may reference images not displayed]

FINDINGS: The lumbar vertebrae are in normal alignment.  There is
mild degenerative disc disease at L4-5 and L5 S1 levels with some
vacuum disc phenomenon present.  There is degenerative change
involving the facet joints of the lower lumbar spine as well.  The
SI joints appear normal.  No acute compression deformity is seen.
Surgical clips are present in the right upper quadrant from prior
cholecystectomy.
IMPRESSION: 1.  No acute abnormality.  Normal alignment.
2.  Degenerative disc disease at L4-5 and L5 as well with
degenerative change involving the facet joints of these levels as
well.

## 2011-06-09 IMAGING — CR DG KNEE COMPLETE 4+V*R*
4 series · 4 of 4 positions shown · non-contrast
Comparison: Right knee films of [DATE]

CLINICAL DATA: Fell today with pain

RIGHT KNEE - COMPLETE 4+ VIEW

[t knee ap right]
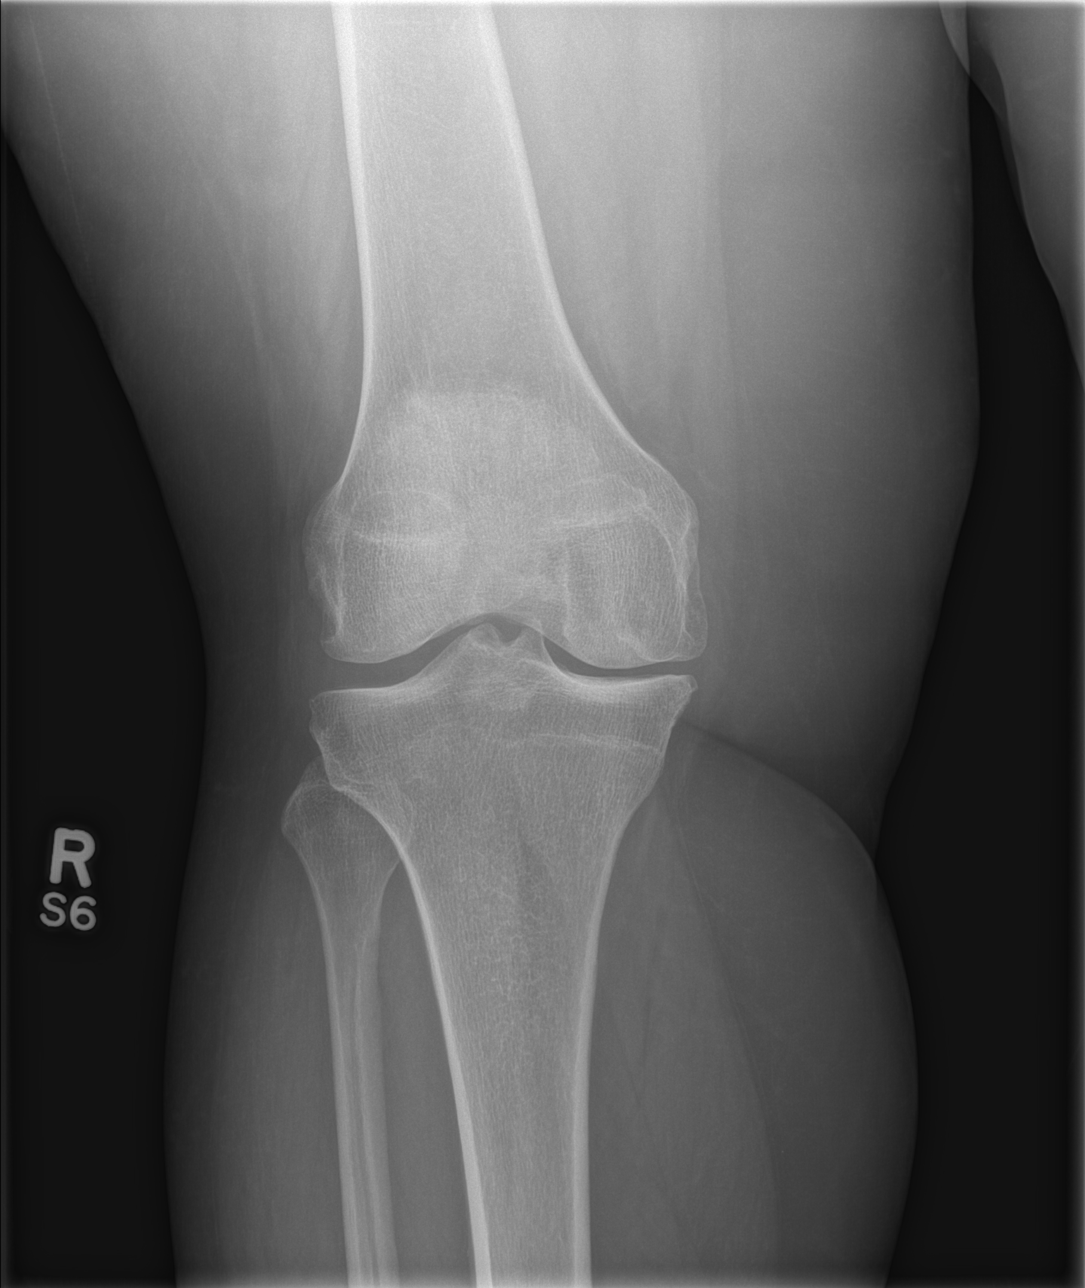

[t knee obl right (1 of 2)]
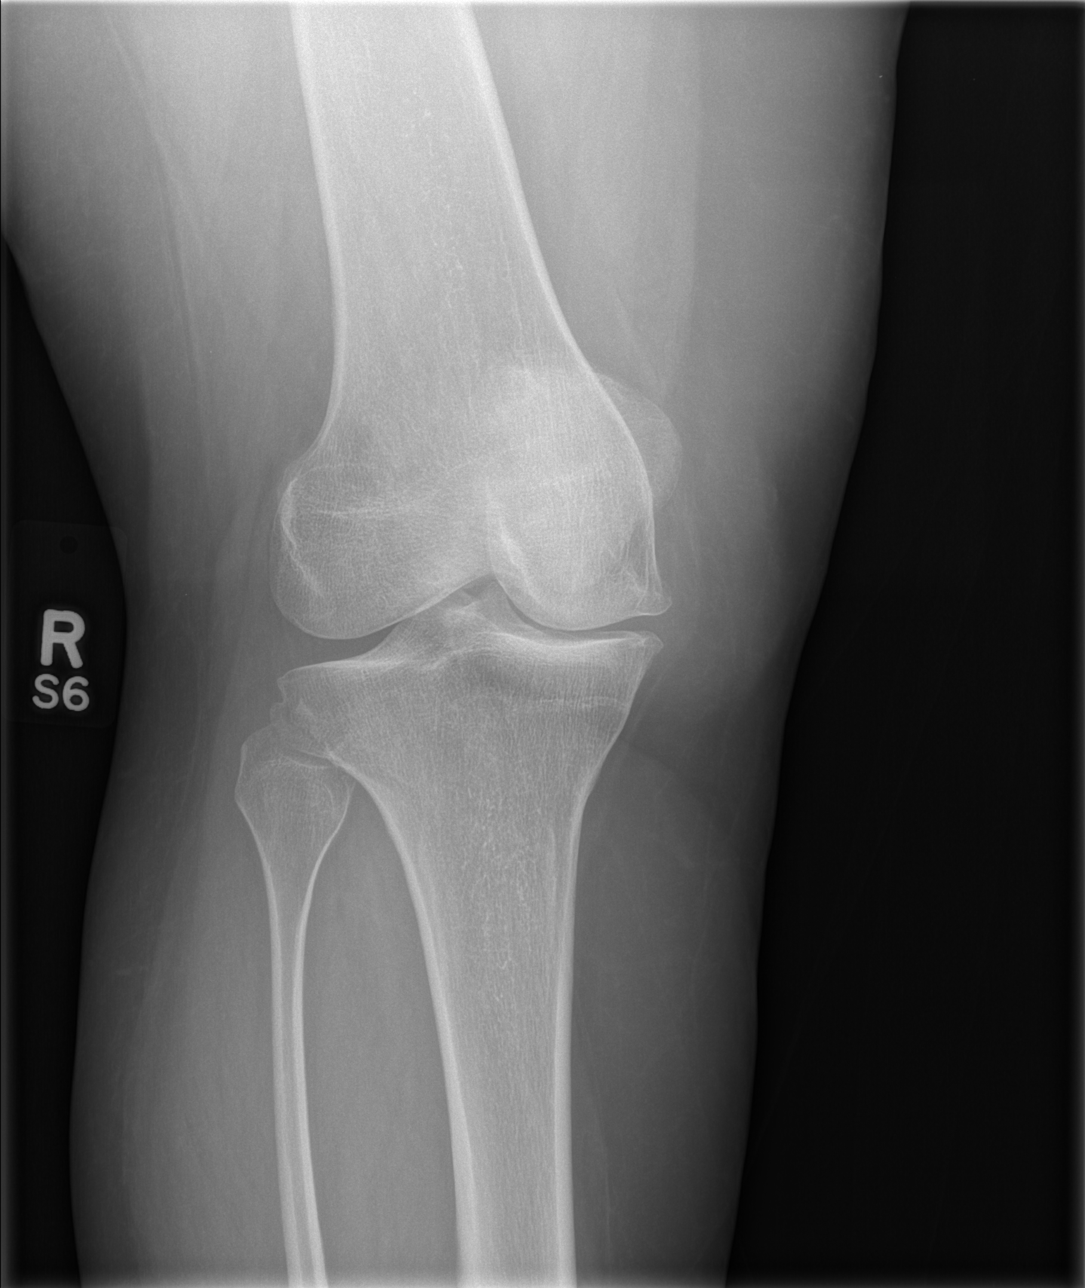

[t knee obl right (2 of 2)]
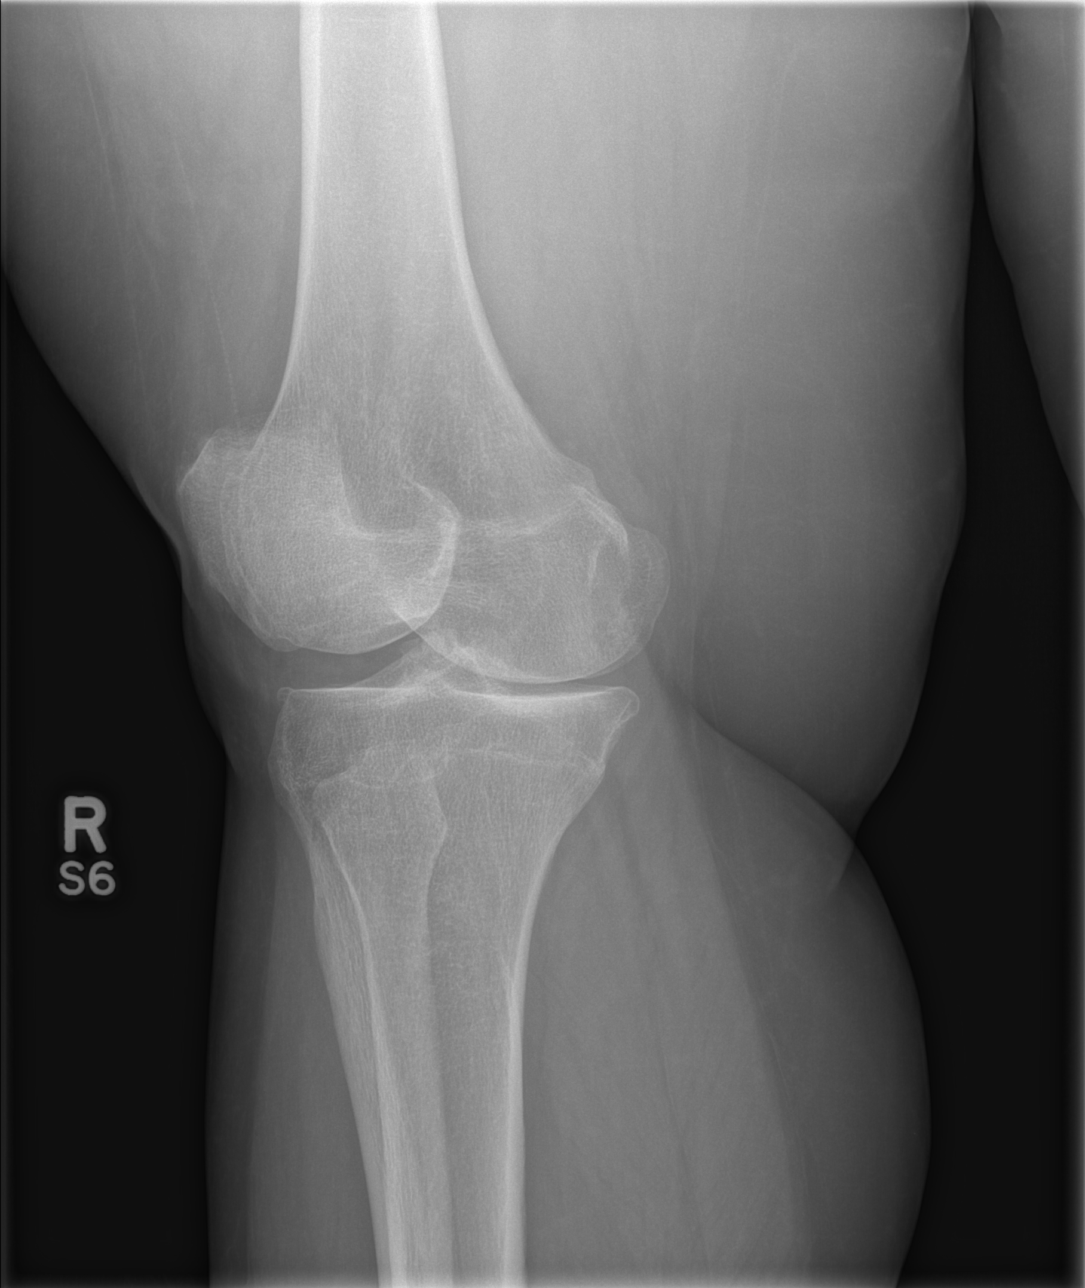

[t knee lat right]
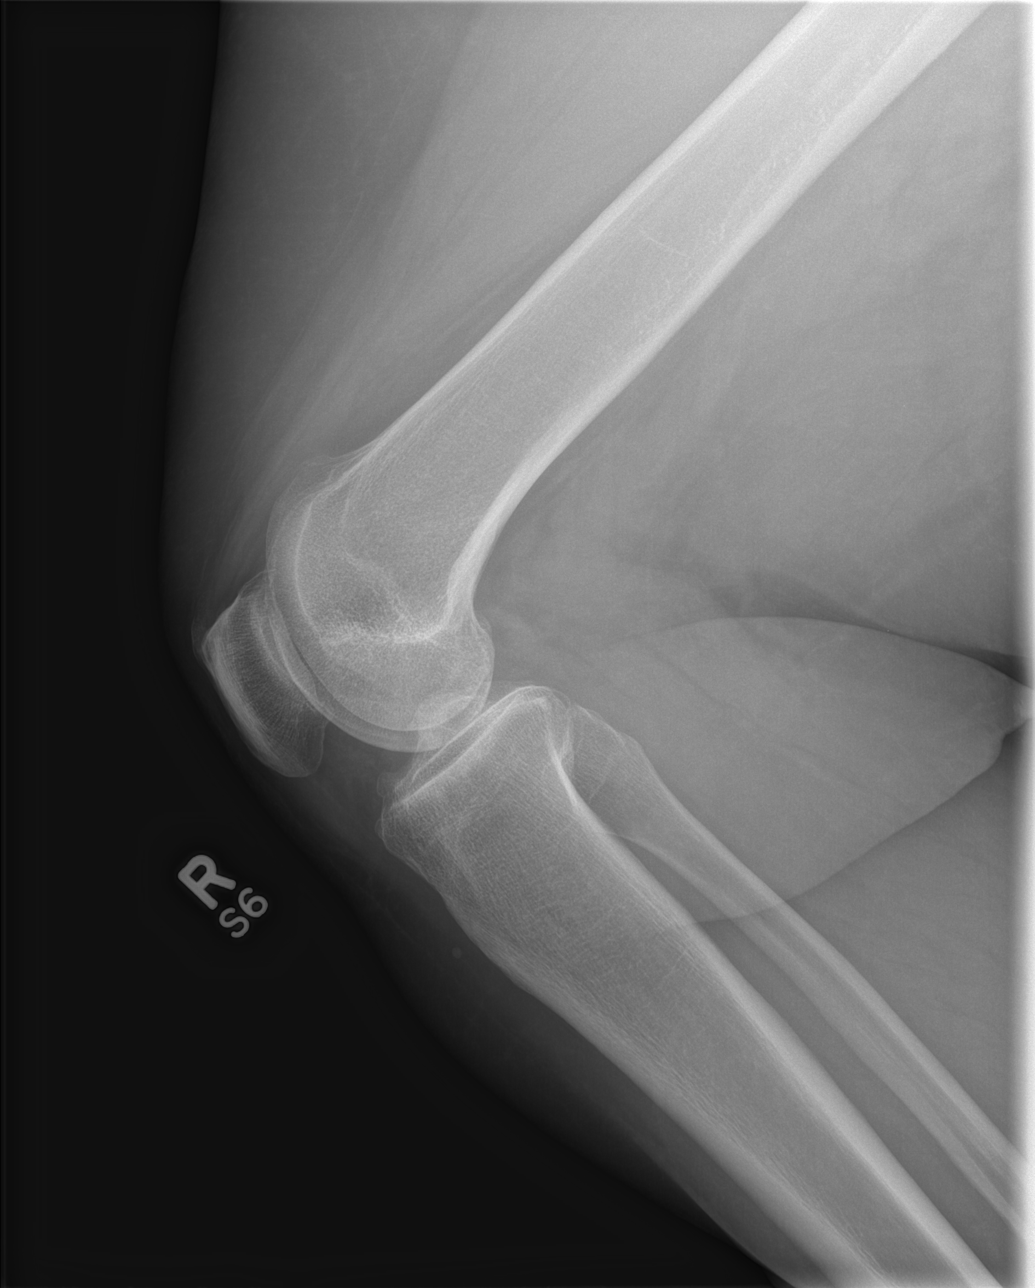

[4 of 4 positions shown; findings below may reference images not displayed]

FINDINGS: There is degenerative joint disease primarily involve the
medial compartment where there is loss of joint space and mild
sclerosis with spurring.  No acute abnormality is seen.  No
effusion is noted.
IMPRESSION: No acute abnormality.  Degenerative joint disease medially.

## 2011-06-09 IMAGING — CR DG CERVICAL SPINE COMPLETE 4+V
6 series · 6 of 6 positions shown · non-contrast
Comparison: None.

CLINICAL DATA: Fall with pain.

CERVICAL SPINE - COMPLETE 4+ VIEW

[w cervical spine lat]
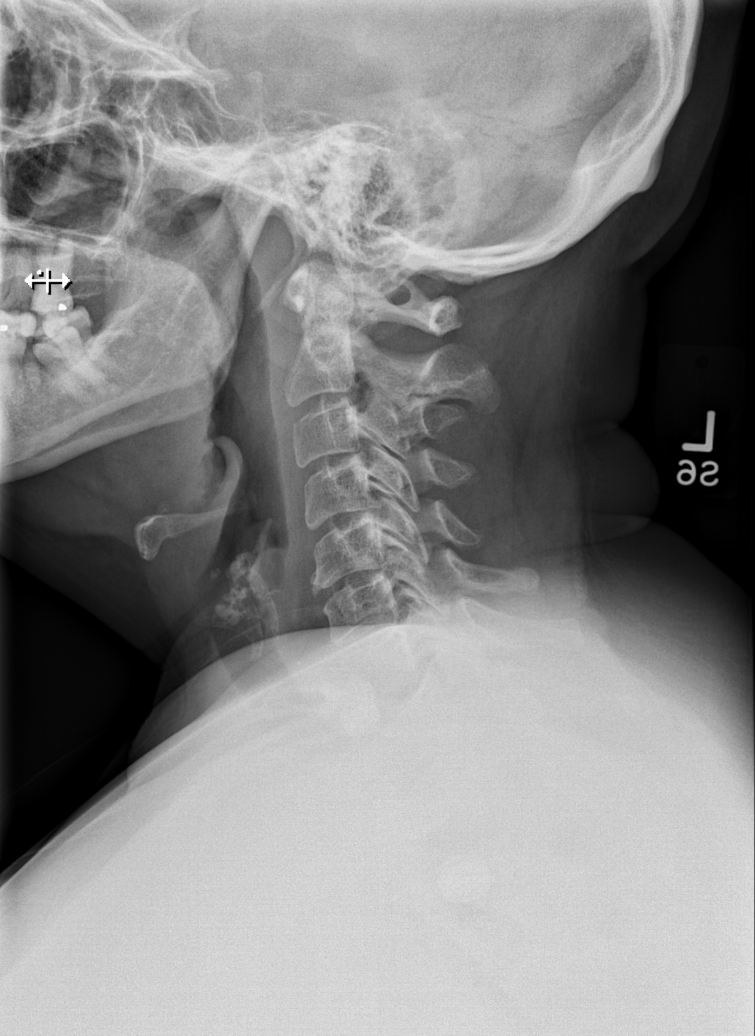

[w cervical spine ap_obl (1 of 2)]
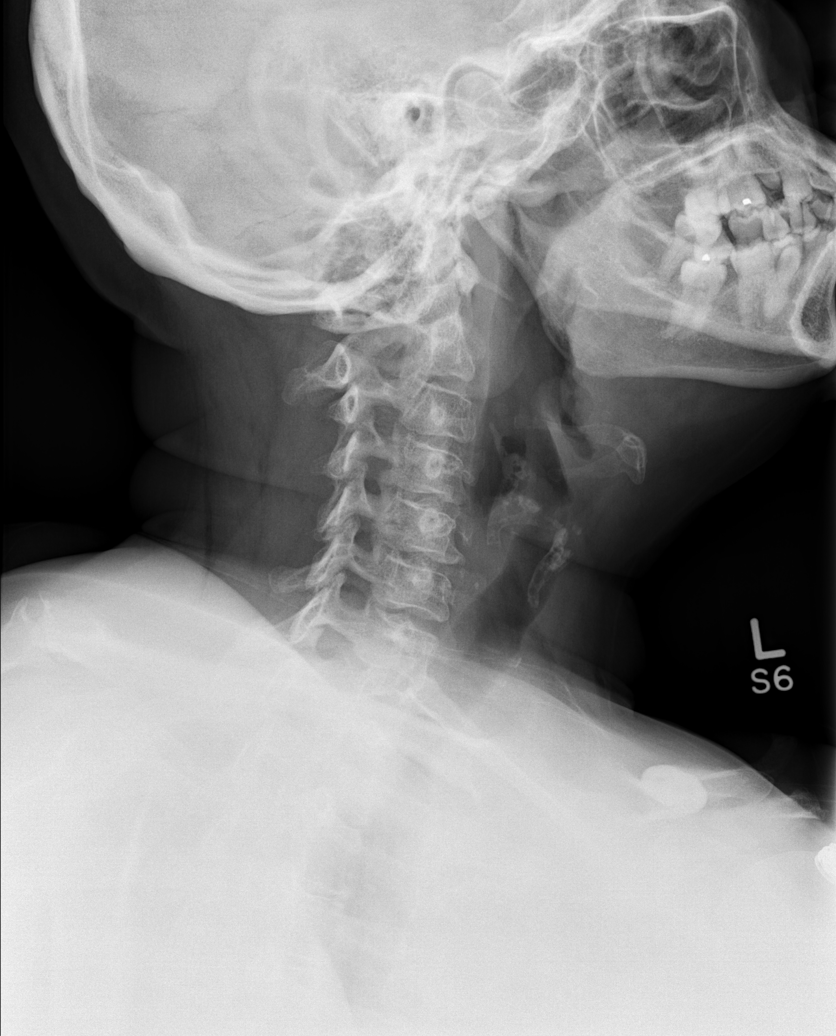

[w cervical spine ap_obl (2 of 2)]
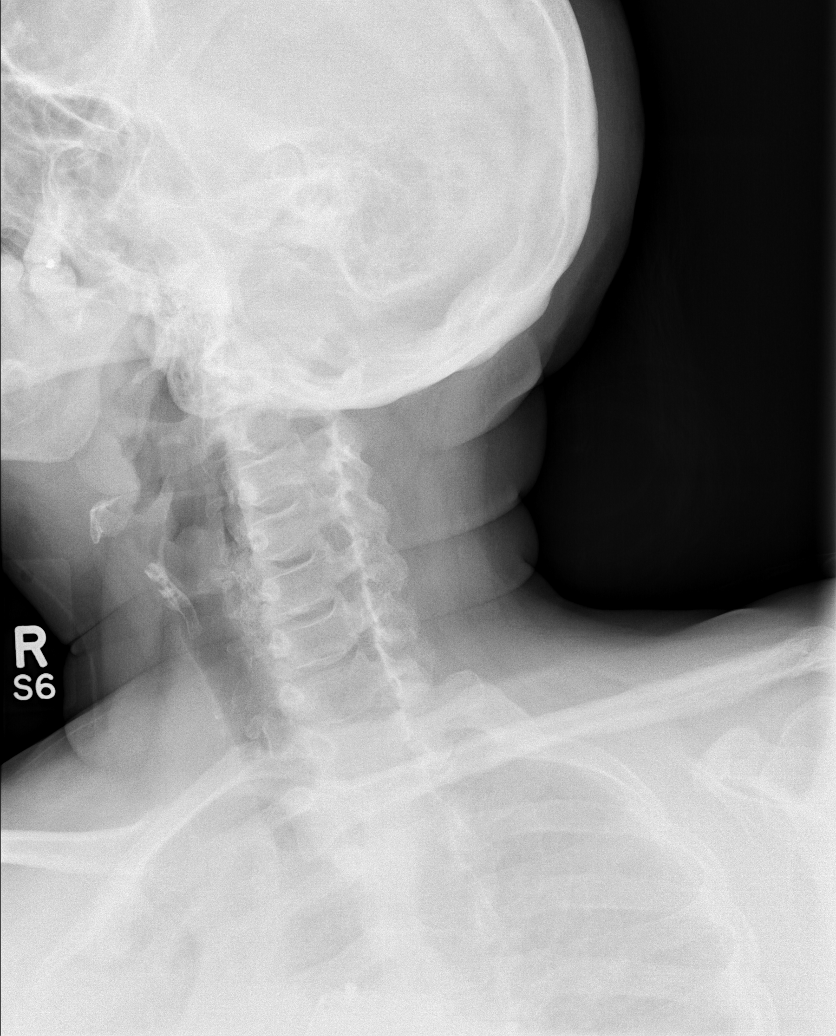

[w cervical spine ap]
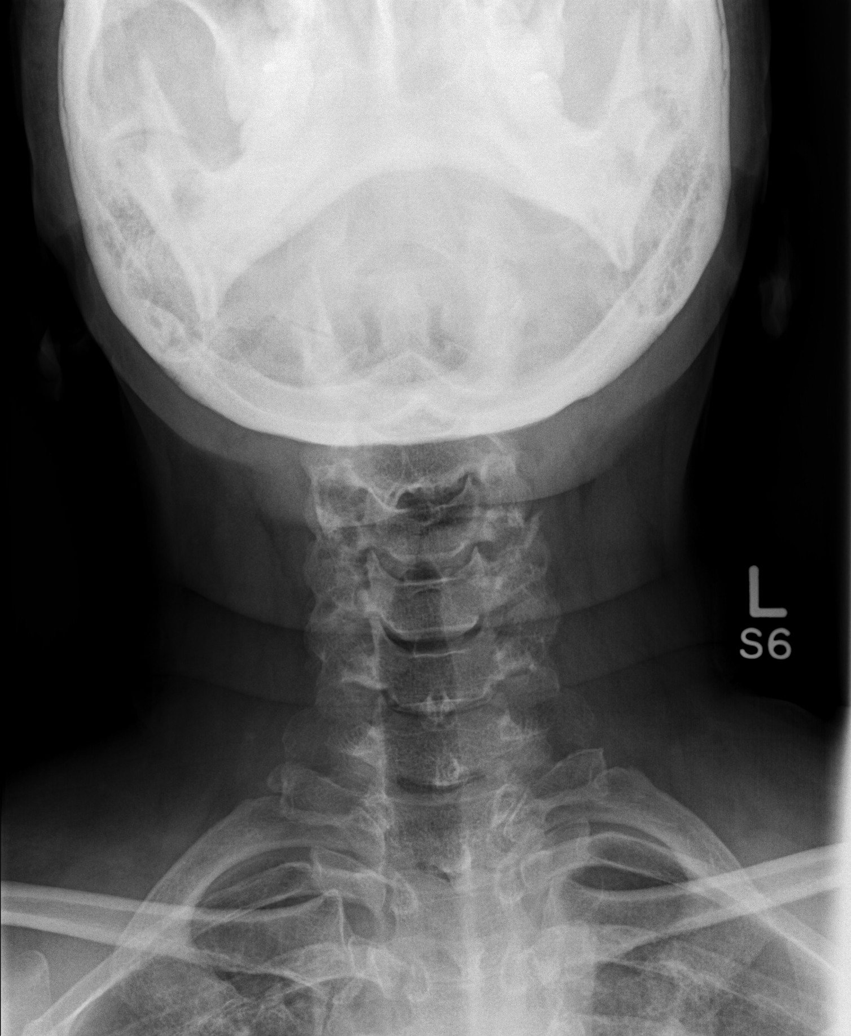

[w cervical spine odontoid]
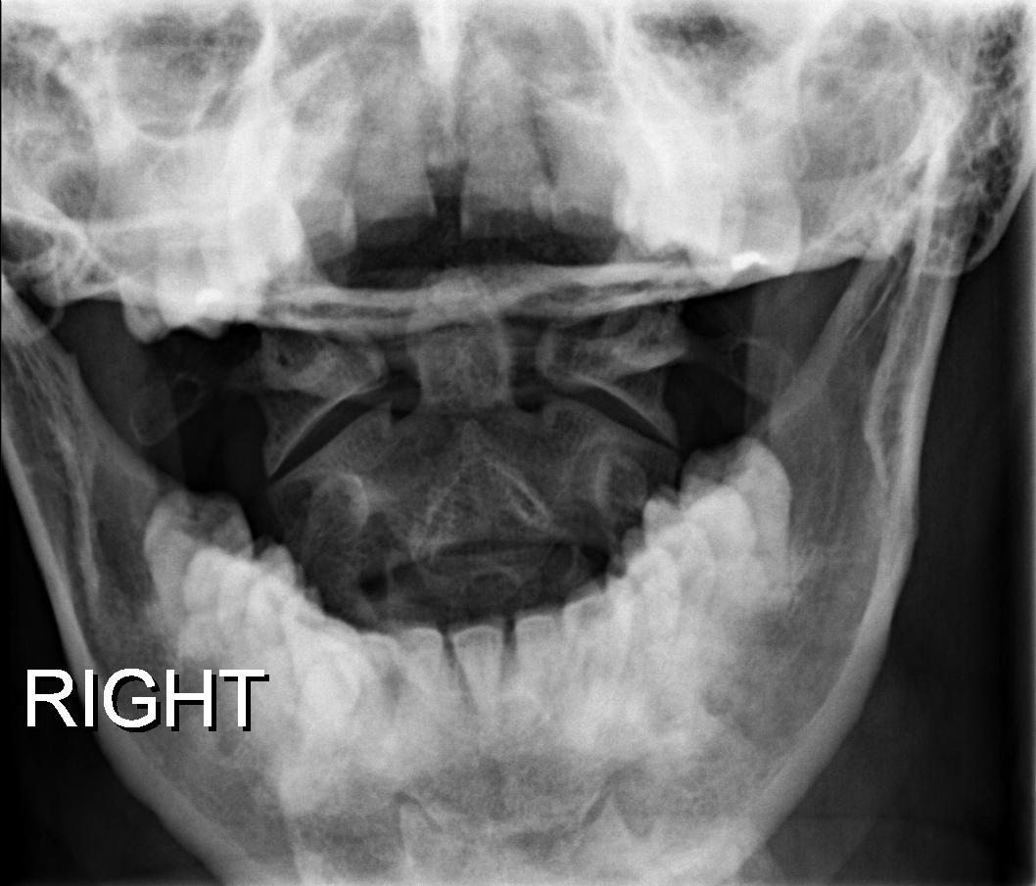

[w cervical swimmers]
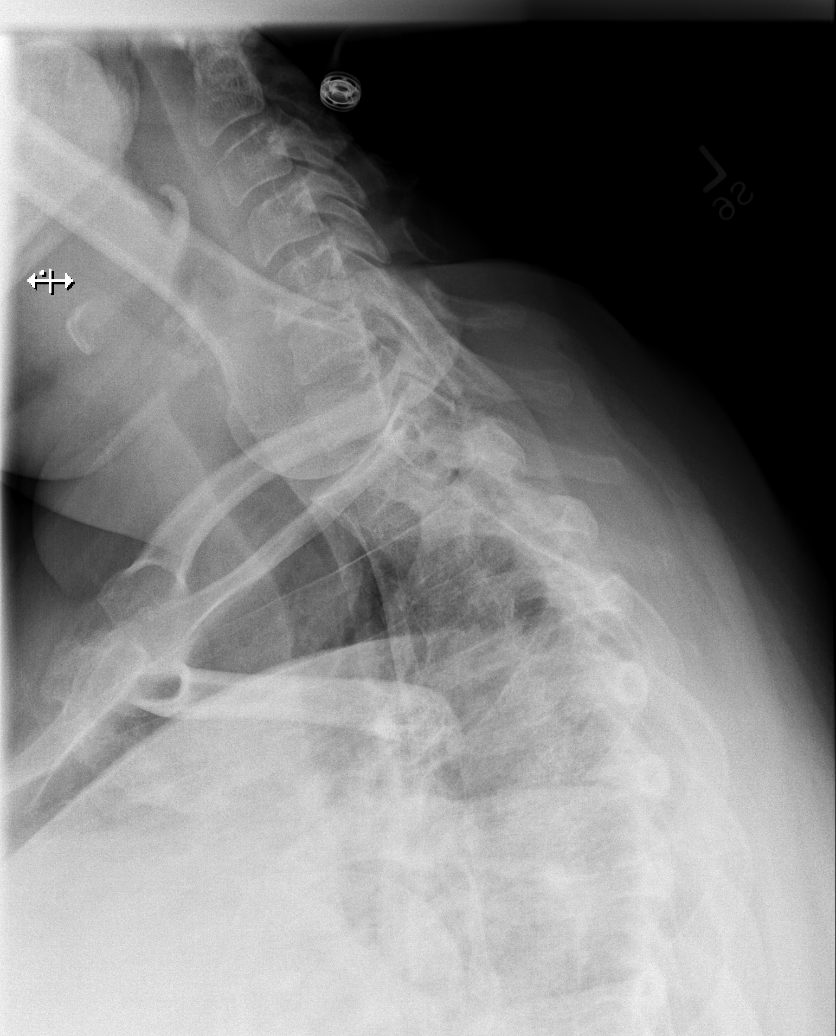

[6 of 6 positions shown; findings below may reference images not displayed]

FINDINGS: The cervical spine is visualized from the occiput to the
C7-T1 junction.  There is straightening of the normal cervical
lordosis without subluxation or fracture.  Prevertebral soft
tissues are within normal limits.

Uncovertebral and facet hypertrophy are seen at all levels.  Mild
loss of disc space height at C5-6.  Neural foramina appear patent.
Dens is partially obscured on the dedicated view.
IMPRESSION: 1.  Straightening of the normal cervical lordosis without
subluxation or fracture.
2.  Spondylosis.

## 2011-06-09 MED ORDER — HYDROCODONE-ACETAMINOPHEN 5-325 MG PO TABS
1.0000 | ORAL_TABLET | Freq: Once | ORAL | Status: AC
  Administered 2011-06-09: 1 via ORAL
  Filled 2011-06-09: qty 1

## 2011-06-09 MED ORDER — OXYCODONE-ACETAMINOPHEN 5-325 MG PO TABS: 1.0000 | ORAL_TABLET | ORAL | Status: DC | PRN

## 2011-06-09 MED ORDER — DOXYCYCLINE HYCLATE 100 MG PO CAPS: 100.0000 mg | ORAL_CAPSULE | Freq: Two times a day (BID) | ORAL | Status: DC

## 2011-06-09 MED ORDER — CYCLOBENZAPRINE HCL 10 MG PO TABS: 10.0000 mg | ORAL_TABLET | Freq: Two times a day (BID) | ORAL | Status: DC | PRN

## 2011-06-09 MED ORDER — CYCLOBENZAPRINE HCL 10 MG PO TABS
5.0000 mg | ORAL_TABLET | Freq: Once | ORAL | Status: AC
  Administered 2011-06-09: 5 mg via ORAL
  Filled 2011-06-09: qty 1

## 2011-06-09 NOTE — ED Notes (Signed)
Patient transported to X-ray 
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
## 2011-06-09 NOTE — ED Notes (Addendum)
Returned from x-ray---reports minimal decrease in overall pain.  Dr. Ignacia Palma notified.

## 2011-06-09 NOTE — ED Notes (Signed)
Dr. Ignacia Palma in to discuss x-rays with patient.
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
## 2011-06-09 NOTE — ED Notes (Signed)
Went in to see pt.   Asked if she has any needs. States she is hungry but made aware the EDP must see her before she can eat.  Call bell in reach, lights out for comfort.
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
## 2011-06-13 ENCOUNTER — Telehealth: Payer: Self-pay
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
## 2011-06-14 MED ORDER — CYCLOBENZAPRINE HCL 10 MG PO TABS: 10.0000 mg | ORAL_TABLET | Freq: Two times a day (BID) | ORAL | Status: DC | PRN

## 2011-06-14 NOTE — Telephone Encounter (Signed)
Patient notified and rx sent in 

## 2011-06-14 NOTE — Telephone Encounter (Signed)
sure

## 2011-06-20 ENCOUNTER — Telehealth: Payer: Self-pay

## 2011-06-20 NOTE — Telephone Encounter (Signed)
Patienrt lmovm x 3 stating that muscle relaxer and ibuprofen is not helping her pain. She is requesting MD to send something different and wants to know if appt needed

## 2011-06-23 ENCOUNTER — Telehealth: Payer: Self-pay | Admitting: *Deleted

## 2011-06-23 ENCOUNTER — Encounter: Payer: Self-pay | Admitting: Internal Medicine

## 2011-06-23 ENCOUNTER — Ambulatory Visit (INDEPENDENT_AMBULATORY_CARE_PROVIDER_SITE_OTHER): Payer: Medicare Other | Admitting: Internal Medicine

## 2011-06-23 VITALS — BP 124/80 | HR 80 | Temp 98.0°F | Resp 16 | Wt 281.2 lb

## 2011-06-23 DIAGNOSIS — S40019A Contusion of unspecified shoulder, initial encounter: Secondary | ICD-10-CM

## 2011-06-23 DIAGNOSIS — S40012A Contusion of left shoulder, initial encounter: Secondary | ICD-10-CM | POA: Insufficient documentation

## 2011-06-23 MED ORDER — HYDROCODONE-IBUPROFEN 10-200 MG PO TABS: 1.0000 | ORAL_TABLET | Freq: Three times a day (TID) | ORAL | Status: DC | PRN

## 2011-06-23 MED ORDER — HYDROCODONE-IBUPROFEN 7.5-200 MG PO TABS: 1.0000 | ORAL_TABLET | Freq: Three times a day (TID) | ORAL | Status: AC | PRN

## 2011-06-23 NOTE — Telephone Encounter (Signed)
Closing phone note, pt seen in office
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
## 2011-06-23 NOTE — Telephone Encounter (Signed)
I just saw her didn't I?
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
## 2011-06-25 ENCOUNTER — Encounter: Payer: Self-pay | Admitting: Internal Medicine

## 2011-06-25 NOTE — Assessment & Plan Note (Signed)
Her exam is normal today. I wrote for stronger pain meds. She will continue to work with her chiropractor.

## 2011-07-14 ENCOUNTER — Ambulatory Visit (INDEPENDENT_AMBULATORY_CARE_PROVIDER_SITE_OTHER): Payer: Medicare Other | Admitting: Internal Medicine

## 2011-07-14 ENCOUNTER — Other Ambulatory Visit: Payer: Self-pay | Admitting: Internal Medicine

## 2011-07-14 ENCOUNTER — Other Ambulatory Visit (INDEPENDENT_AMBULATORY_CARE_PROVIDER_SITE_OTHER): Payer: Medicare Other

## 2011-07-14 ENCOUNTER — Ambulatory Visit (INDEPENDENT_AMBULATORY_CARE_PROVIDER_SITE_OTHER)
Admission: RE | Admit: 2011-07-14 | Discharge: 2011-07-14 | Disposition: A | Discharge status: Home / Self Care | Payer: Medicare Other | Source: Ambulatory Visit | Attending: Internal Medicine | Admitting: Internal Medicine

## 2011-07-14 ENCOUNTER — Encounter: Payer: Self-pay | Admitting: Internal Medicine

## 2011-07-14 DIAGNOSIS — K219 Gastro-esophageal reflux disease without esophagitis: Secondary | ICD-10-CM

## 2011-07-14 DIAGNOSIS — J209 Acute bronchitis, unspecified: Secondary | ICD-10-CM

## 2011-07-14 DIAGNOSIS — E785 Hyperlipidemia, unspecified: Secondary | ICD-10-CM

## 2011-07-14 DIAGNOSIS — E538 Deficiency of other specified B group vitamins: Secondary | ICD-10-CM

## 2011-07-14 DIAGNOSIS — J40 Bronchitis, not specified as acute or chronic: Secondary | ICD-10-CM | POA: Insufficient documentation

## 2011-07-14 DIAGNOSIS — R05 Cough: Secondary | ICD-10-CM

## 2011-07-14 LAB — LIPID PANEL
Total CHOL/HDL Ratio: 3
VLDL: 30 mg/dL (ref 0.0–40.0)

## 2011-07-14 LAB — CBC WITH DIFFERENTIAL/PLATELET
Basophils Relative: 0.3 % (ref 0.0–3.0)
Eosinophils Relative: 0.7 % (ref 0.0–5.0)
HCT: 35.5 % — ABNORMAL LOW (ref 36.0–46.0)
Hemoglobin: 11.9 g/dL — ABNORMAL LOW (ref 12.0–15.0)
Lymphocytes Relative: 23.1 % (ref 12.0–46.0)
Monocytes Relative: 7.2 % (ref 3.0–12.0)
Neutro Abs: 6.4 10*3/uL (ref 1.4–7.7)
RBC: 4.09 Mil/uL (ref 3.87–5.11)

## 2011-07-14 LAB — COMPREHENSIVE METABOLIC PANEL
ALT: 16 U/L (ref 0–35)
BUN: 15 mg/dL (ref 6–23)
CO2: 27 mEq/L (ref 19–32)
Calcium: 8.7 mg/dL (ref 8.4–10.5)
Chloride: 106 mEq/L (ref 96–112)
Creatinine, Ser: 0.7 mg/dL (ref 0.4–1.2)
GFR: 105.31 mL/min (ref >60.00)
Glucose, Bld: 104 mg/dL — ABNORMAL HIGH (ref 70–99)

## 2011-07-14 LAB — URINALYSIS, ROUTINE W REFLEX MICROSCOPIC
Hgb urine dipstick: NEGATIVE
Urine Glucose: NEGATIVE
Urobilinogen, UA: 0.2 (ref 0.0–1.0)

## 2011-07-14 LAB — TSH: TSH: 2.44 u[IU]/mL (ref 0.35–5.50)

## 2011-07-14 IMAGING — CR DG CHEST 2V
2 series · 2 of 2 positions shown · non-contrast
Comparison: [DATE]

CLINICAL DATA: Cough.  Shortness of breath and chest pain

CHEST - 2 VIEW

[view not recorded (1 of 2)]
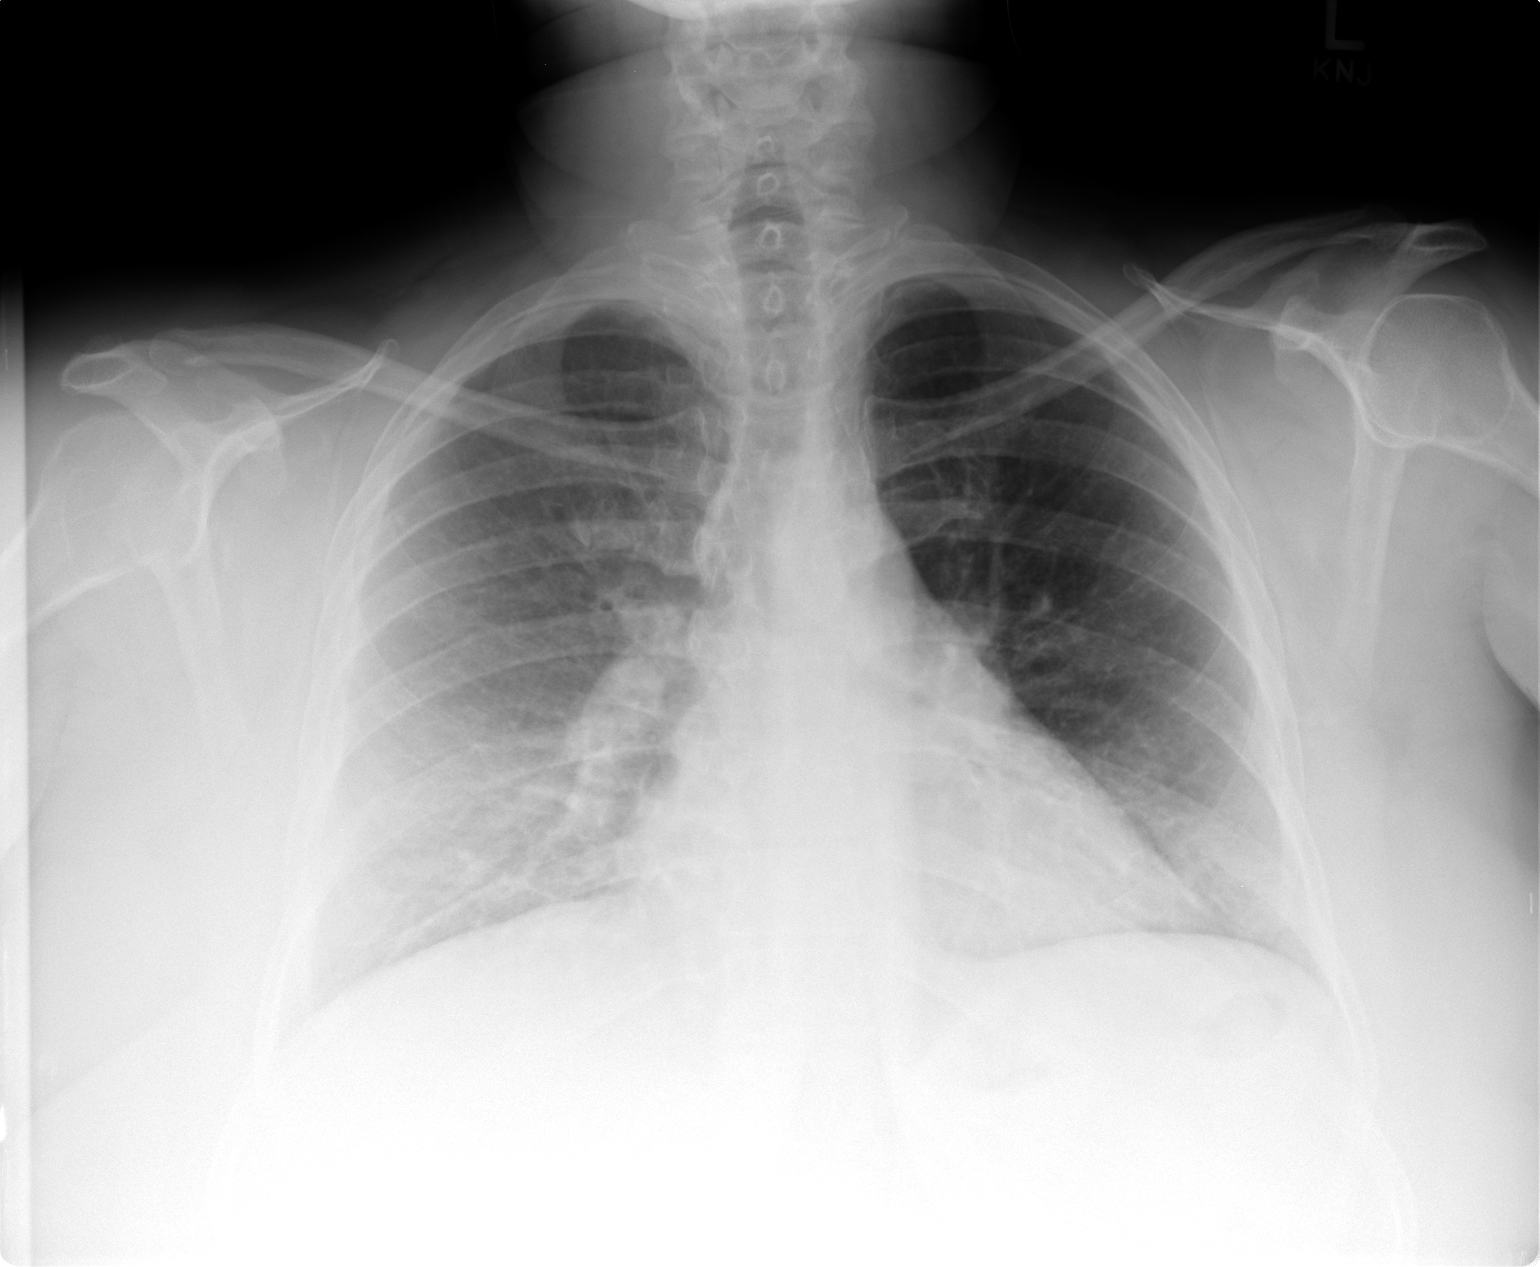

[view not recorded (2 of 2)]
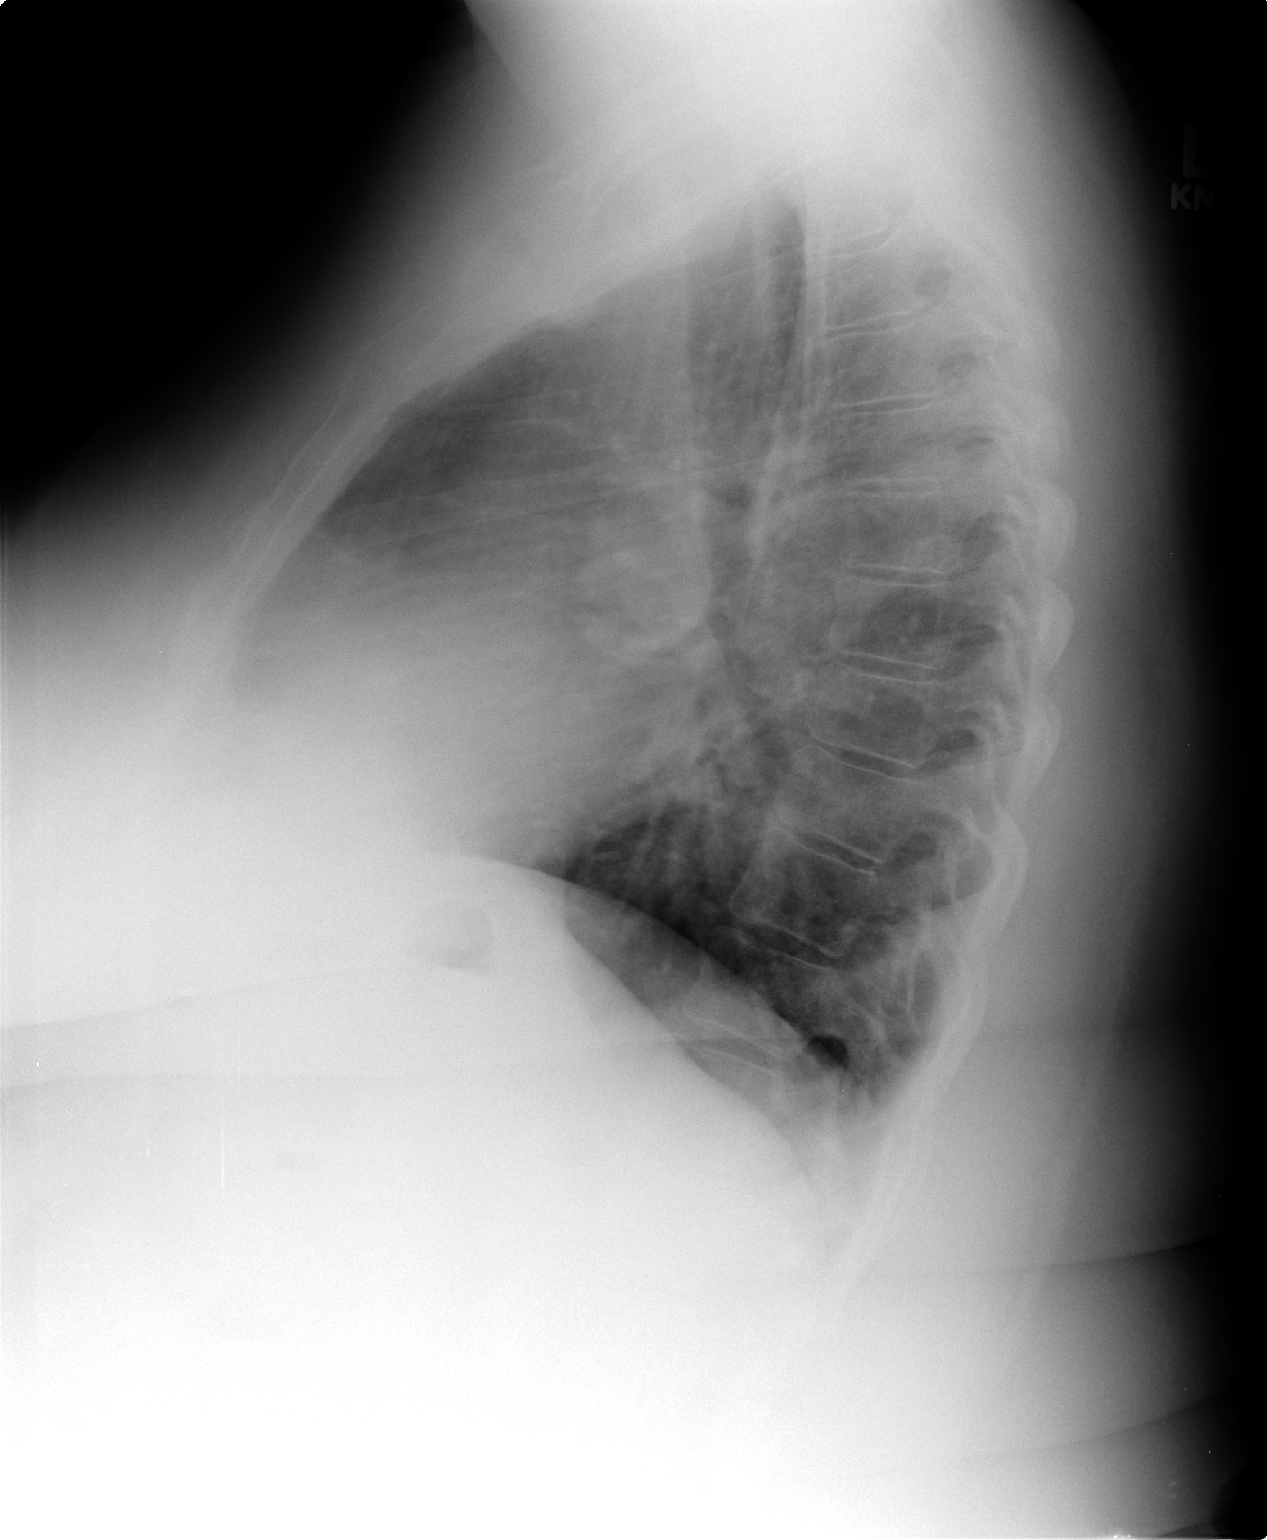

[2 of 2 positions shown; findings below may reference images not displayed]

FINDINGS: The heart size and mediastinal contours are within normal
limits.  Both lungs are clear.  The visualized skeletal structures
are unremarkable.
IMPRESSION: Acute cardiopulmonary abnormalities.

## 2011-07-14 MED ORDER — CEFUROXIME AXETIL 500 MG PO TABS: 500.0000 mg | ORAL_TABLET | Freq: Two times a day (BID) | ORAL | Status: AC

## 2011-07-14 MED ORDER — RABEPRAZOLE SODIUM 20 MG PO TBEC: 20.0000 mg | DELAYED_RELEASE_TABLET | Freq: Every day | ORAL | Status: DC

## 2011-07-14 MED ORDER — PSEUDOEPH-HYDROCODONE 60-5 MG/5ML PO SOLN: 5.0000 mL | Freq: Four times a day (QID) | ORAL | Status: DC | PRN

## 2011-07-14 NOTE — Assessment & Plan Note (Signed)
I will check her CXR to look for pna, mass, edema, etc. 

## 2011-07-14 NOTE — Assessment & Plan Note (Signed)
Start ceftin for the infection and rezira for the cough

## 2011-07-14 NOTE — Assessment & Plan Note (Signed)
Labs today

## 2011-07-14 NOTE — Assessment & Plan Note (Signed)
Her symptoms were not controlled with omeprazole so I have asked her to start aciphex

## 2011-07-14 NOTE — Assessment & Plan Note (Signed)
I will check her CBC today 

## 2011-07-14 NOTE — Progress Notes (Signed)
Subjective:    Patient ID: Danielle Holt, female    DOB: 20-Jun-1956, 56 y.o.   MRN: [id number]  Cough This is a new problem. Episode onset: for 2 weeks. The problem has been gradually worsening. The problem occurs constantly. The cough is productive of purulent sputum. Associated symptoms include chills, heartburn, nasal congestion, a sore throat and sweats. Pertinent negatives include no chest pain, ear congestion, ear pain, fever, headaches, hemoptysis, myalgias, postnasal drip, rash, rhinorrhea, shortness of breath, weight loss or wheezing. She has tried nothing for the symptoms. Her past medical history is significant for asthma.  Gastrophageal Reflux She complains of coughing, heartburn, a sore throat and water brash. She reports no abdominal pain, no belching, no chest pain, no choking, no dysphagia, no early satiety, no globus sensation, no hoarse voice, no nausea, no stridor, no tooth decay or no wheezing. This is a chronic problem. The current episode started more than 1 year ago. The problem occurs occasionally. The problem has been gradually worsening. The heartburn duration is several minutes. The heartburn is located in the substernum. The heartburn is of moderate intensity. The heartburn does not wake her from sleep. The heartburn does not limit her activity. The heartburn doesn't change with position. Pertinent negatives include no anemia, fatigue, melena, muscle weakness, orthopnea or weight loss. She has tried a PPI (omeprazole) for the symptoms. The treatment provided mild relief.      Review of Systems  Constitutional: Positive for chills and unexpected weight change (some weight gain). Negative for fever, weight loss, diaphoresis, activity change, appetite change and fatigue.  HENT: Positive for sore throat. Negative for ear pain, hoarse voice, rhinorrhea, trouble swallowing, voice change and postnasal drip.   Eyes: Negative.   Respiratory: Positive for cough. Negative for  hemoptysis, choking, chest tightness, shortness of breath, wheezing and stridor.   Cardiovascular: Negative for chest pain, palpitations and leg swelling.  Gastrointestinal: Positive for heartburn. Negative for dysphagia, nausea, vomiting, abdominal pain, diarrhea, constipation, blood in stool, melena, abdominal distention, anal bleeding and rectal pain.  Genitourinary: Negative for dysuria, urgency, frequency, hematuria, flank pain, decreased urine volume, vaginal bleeding, vaginal discharge, enuresis, difficulty urinating, genital sores, vaginal pain, menstrual problem, pelvic pain and dyspareunia.  Musculoskeletal: Positive for arthralgias (knees). Negative for myalgias, back pain, joint swelling, gait problem and muscle weakness.  Skin: Negative for color change, pallor, rash and wound.  Neurological: Negative for dizziness, tremors, seizures, syncope, facial asymmetry, speech difficulty, weakness, light-headedness, numbness and headaches.  Hematological: Negative for adenopathy. Does not bruise/bleed easily.  Psychiatric/Behavioral: Negative.        Objective:   Physical Exam  Vitals reviewed. Constitutional: She is oriented to person, place, and time. She appears well-developed and well-nourished. No distress.  HENT:  Head: Normocephalic and atraumatic.  Mouth/Throat: Oropharynx is clear and moist. No oropharyngeal exudate.  Eyes: Conjunctivae are normal. Right eye exhibits no discharge. Left eye exhibits no discharge. No scleral icterus.  Neck: Normal range of motion. Neck supple. No JVD present. No tracheal deviation present. No thyromegaly present.  Cardiovascular: Normal rate, regular rhythm, normal heart sounds and intact distal pulses.  Exam reveals no gallop and no friction rub.   No murmur heard. Pulmonary/Chest: Effort normal and breath sounds normal. No stridor. No respiratory distress. She has no wheezes. She has no rales. She exhibits no tenderness.  Abdominal: Soft. Bowel  sounds are normal. She exhibits no distension and no mass. There is no tenderness. There is no rebound and no guarding.  Musculoskeletal: Normal range of motion. She exhibits edema (1+ edema in both legs). She exhibits no tenderness.  Lymphadenopathy:    She has no cervical adenopathy.  Neurological: She is oriented to person, place, and time.  Skin: Skin is warm and dry. No rash noted. She is not diaphoretic. No erythema. No pallor.  Psychiatric: She has a normal mood and affect. Her behavior is normal. Judgment and thought content normal.      Lab Results  Component Value Date   WBC 6.7 07/25/2010   HGB 12.1 07/25/2010   HCT 36.2 07/25/2010   PLT 244.0 07/25/2010   GLUCOSE 99 09/26/2010   CHOL 208* 07/25/2010   TRIG 88.0 07/25/2010   HDL 63.00 07/25/2010   LDLDIRECT 138.1 07/25/2010   ALT 18 07/25/2010   AST 19 07/25/2010   NA 137 09/26/2010   K 4.4 09/26/2010   CL 105 09/26/2010   CREATININE 0.8 09/26/2010   BUN 11 09/26/2010   CO2 26 09/26/2010   TSH 1.66 07/25/2010      Assessment & Plan:

## 2011-07-14 NOTE — Patient Instructions (Addendum)
Acute Bronchitis You have acute bronchitis. This means you have a chest cold. The airways in your lungs are red and sore (inflamed). Acute means it is sudden onset.  CAUSES Bronchitis is most often caused by the same virus that causes a cold. SYMPTOMS   Body aches.   Chest congestion.   Chills.   Cough.   Fever.   Shortness of breath.   Sore throat.  TREATMENT  Acute bronchitis is usually treated with rest, fluids, and medicines for relief of fever or cough. Most symptoms should go away after a few days or a week. Increased fluids may help thin your secretions and will prevent dehydration. Your caregiver may give you an inhaler to improve your symptoms. The inhaler reduces shortness of breath and helps control cough. You can take over-the-counter pain relievers or cough medicine to decrease coughing, pain, or fever. A cool-air vaporizer may help thin bronchial secretions and make it easier to clear your chest. Antibiotics are usually not needed but can be prescribed if you smoke, are seriously ill, have chronic lung problems, are elderly, or you are at higher risk for developing complications.Allergies and asthma can make bronchitis worse. Repeated episodes of bronchitis may cause longstanding lung problems. Avoid smoking and secondhand smoke.Exposure to cigarette smoke or irritating chemicals will make bronchitis worse. If you are a cigarette smoker, consider using nicotine gum or skin patches to help control withdrawal symptoms. Quitting smoking will help your lungs heal faster. Recovery from bronchitis is often slow, but you should start feeling better after 2 to 3 days. Cough from bronchitis frequently lasts for 3 to 4 weeks. To prevent another bout of acute bronchitis:  Quit smoking.   Wash your hands frequently to get rid of viruses or use a hand sanitizer.   Avoid other people with cold or virus symptoms.   Try not to touch your hands to your mouth, nose, or eyes.  SEEK  IMMEDIATE MEDICAL CARE IF:  You develop increased fever, chills, or chest pain.   You have severe shortness of breath or bloody sputum.   You develop dehydration, fainting, repeated vomiting, or a severe headache.   You have no improvement after 1 week of treatment or you get worse.  MAKE SURE YOU:   Understand these instructions.   Will watch your condition.   Will get help right away if you are not doing well or get worse.  Document Released: 08/03/2004 Document Revised: 03/08/2011 Document Reviewed: 10/19/2010 [facility] Patient Information 2012 [place], [place].Gastroesophageal Reflux Disease, Adult Gastroesophageal reflux disease (GERD) happens when acid from your stomach flows up into the esophagus. When acid comes in contact with the esophagus, the acid causes soreness (inflammation) in the esophagus. Over time, GERD may create small holes (ulcers) in the lining of the esophagus. CAUSES   Increased body weight. This puts pressure on the stomach, making acid rise from the stomach into the esophagus.   Smoking. This increases acid production in the stomach.   Drinking alcohol. This causes decreased pressure in the lower esophageal sphincter (valve or ring of muscle between the esophagus and stomach), allowing acid from the stomach into the esophagus.   Late evening meals and a full stomach. This increases pressure and acid production in the stomach.   A malformed lower esophageal sphincter.  Sometimes, no cause is found. SYMPTOMS   Burning pain in the lower part of the mid-chest behind the breastbone and in the mid-stomach area. This may occur twice a week or more often.  Trouble swallowing.   Sore throat.   Dry cough.   Asthma-like symptoms including chest tightness, shortness of breath, or wheezing.  DIAGNOSIS  Your caregiver may be able to diagnose GERD based on your symptoms. In some cases, X-rays and other tests may be done to check for complications or to check the  condition of your stomach and esophagus. TREATMENT  Your caregiver may recommend over-the-counter or prescription medicines to help decrease acid production. Ask your caregiver before starting or adding any new medicines.  HOME CARE INSTRUCTIONS   Change the factors that you can control. Ask your caregiver for guidance concerning weight loss, quitting smoking, and alcohol consumption.   Avoid foods and drinks that make your symptoms worse, such as:   Caffeine or alcoholic drinks.   Chocolate.   Peppermint or mint flavorings.   Garlic and onions.   Spicy foods.   Citrus fruits, such as oranges, lemons, or limes.   Tomato-based foods such as sauce, chili, salsa, and pizza.   Fried and fatty foods.   Avoid lying down for the 3 hours prior to your bedtime or prior to taking a nap.   Eat small, frequent meals instead of large meals.   Wear loose-fitting clothing. Do not wear anything tight around your waist that causes pressure on your stomach.   Raise the head of your bed 6 to 8 inches with wood blocks to help you sleep. Extra pillows will not help.   Only take over-the-counter or prescription medicines for pain, discomfort, or fever as directed by your caregiver.   Do not take aspirin, ibuprofen, or other nonsteroidal anti-inflammatory drugs (NSAIDs).  SEEK IMMEDIATE MEDICAL CARE IF:   You have pain in your arms, neck, jaw, teeth, or back.   Your pain increases or changes in intensity or duration.   You develop nausea, vomiting, or sweating (diaphoresis).   You develop shortness of breath, or you faint.   Your vomit is green, yellow, black, or looks like coffee grounds or blood.   Your stool is red, bloody, or black.  These symptoms could be signs of other problems, such as heart disease, gastric bleeding, or esophageal bleeding. MAKE SURE YOU:   Understand these instructions.   Will watch your condition.   Will get help right away if you are not doing well or get  worse.  Document Released: 04/05/2005 Document Revised: 03/08/2011 Document Reviewed: 01/13/2011 [facility] Patient Information 2012 [place], [place].

## 2011-07-19 ENCOUNTER — Ambulatory Visit (INDEPENDENT_AMBULATORY_CARE_PROVIDER_SITE_OTHER): Payer: Medicare Other | Admitting: Internal Medicine

## 2011-07-19 ENCOUNTER — Encounter: Payer: Self-pay | Admitting: Internal Medicine

## 2011-07-19 DIAGNOSIS — E039 Hypothyroidism, unspecified: Secondary | ICD-10-CM | POA: Insufficient documentation

## 2011-07-19 DIAGNOSIS — E785 Hyperlipidemia, unspecified: Secondary | ICD-10-CM

## 2011-07-19 DIAGNOSIS — J449 Chronic obstructive pulmonary disease, unspecified: Secondary | ICD-10-CM

## 2011-07-19 DIAGNOSIS — F17200 Nicotine dependence, unspecified, uncomplicated: Secondary | ICD-10-CM

## 2011-07-19 DIAGNOSIS — J4489 Other specified chronic obstructive pulmonary disease: Secondary | ICD-10-CM

## 2011-07-19 DIAGNOSIS — J45909 Unspecified asthma, uncomplicated: Secondary | ICD-10-CM

## 2011-07-19 MED ORDER — ROFLUMILAST 500 MCG PO TABS: 500.0000 ug | ORAL_TABLET | Freq: Every day | ORAL | Status: DC

## 2011-07-19 MED ORDER — ROSUVASTATIN CALCIUM 10 MG PO TABS: 10.0000 mg | ORAL_TABLET | Freq: Every day | ORAL | Status: DC

## 2011-07-19 MED ORDER — FLUTICASONE-SALMETEROL 250-50 MCG/DOSE IN AEPB: 1.0000 | INHALATION_SPRAY | Freq: Two times a day (BID) | RESPIRATORY_TRACT | Status: DC

## 2011-07-19 NOTE — Assessment & Plan Note (Signed)
Restart advair diskus, start daliresp to decrease symptoms and to prevent exacerbations

## 2011-07-19 NOTE — Assessment & Plan Note (Signed)
SHE QUIT 3 DAYS AGO!

## 2011-07-19 NOTE — Assessment & Plan Note (Signed)
Recent TSH was WNL, she will continue the same dose for now

## 2011-07-19 NOTE — Patient Instructions (Addendum)
Asthma, Adult Asthma is caused by narrowing of the air passages in the lungs. It may be triggered by pollen, dust, animal dander, molds, some foods, respiratory infections, exposure to smoke, exercise, emotional stress or other allergens (things that cause allergic reactions or allergies). Repeat attacks are common. HOME CARE INSTRUCTIONS   Use prescription medications as ordered by your caregiver.   Avoid pollen, dust, animal dander, molds, smoke and other things that cause attacks at home and at work.   You may have fewer attacks if you decrease dust in your home. Electrostatic air cleaners may help.   It may help to replace your pillows or mattress with materials less likely to cause allergies.   Talk to your caregiver about an action plan for managing asthma attacks at home, including, the use of a peak flow meter which measures the severity of your asthma attack. An action plan can help minimize or stop the attack without having to seek medical care.   If you are not on a fluid restriction, drink 8 to 10 glasses of water each day.   Always have a plan prepared for seeking medical attention, including, calling your physician, accessing local emergency care, and calling 911 (in the [place] for a severe attack.   Discuss possible exercise routines with your caregiver.   If animal dander is the cause of asthma, you may need to get rid of pets.  SEEK MEDICAL CARE IF:   You have wheezing and shortness of breath even if taking medicine to prevent attacks.   You have muscle aches, chest pain or thickening of sputum.   Your sputum changes from clear or white to yellow, green, gray, or bloody.   You have any problems that may be related to the medicine you are taking (such as a rash, itching, swelling or trouble breathing).  SEEK IMMEDIATE MEDICAL CARE IF:   Your usual medicines do not stop your wheezing or there is increased coughing and/or shortness of breath.   You have increased  difficulty breathing.   You have a fever.  MAKE SURE YOU:   Understand these instructions.   Will watch your condition.   Will get help right away if you are not doing well or get worse.  Document Released: 06/26/2005 Document Revised: 03/08/2011 Document Reviewed: 02/12/2008 [facility] Patient Information 2012 [place], [place].Hypercholesterolemia High Blood Cholesterol Cholesterol is a white, waxy, fat-like protein needed by your body in small amounts. The liver makes all the cholesterol you need. It is carried from the liver by the blood through the blood vessels. Deposits (plaque) may build up on blood vessel walls. This makes the arteries narrower and stiffer. Plaque increases the risk for heart attack and stroke. You cannot feel your cholesterol level even if it is very high. The only way to know is by a blood test to check your lipid (fats) levels. Once you know your cholesterol levels, you should keep a record of the test results. Work with your caregiver to to keep your levels in the desired range. WHAT THE RESULTS MEAN:  Total cholesterol is a rough measure of all the cholesterol in your blood.   LDL is the so-called bad cholesterol. This is the type that deposits cholesterol in the walls of the arteries. You want this level to be low.   HDL is the good cholesterol because it cleans the arteries and carries the LDL away. You want this level to be high.   Triglycerides are fat that the body can either burn  for energy or store. High levels are closely linked to heart disease.  DESIRED LEVELS:  Total cholesterol below 200.   LDL below 100 for people at risk, below 70 for very high risk.   HDL above 50 is good, above 60 is best.   Triglycerides below 150.  HOW TO LOWER YOUR CHOLESTEROL:  Diet.   Choose fish or white meat chicken and [place], roasted or baked. Limit fatty cuts of red meat, fried foods, and processed meats, such as sausage and lunch meat.   Eat lots of fresh  fruits and vegetables. Choose whole grains, beans, pasta, potatoes and cereals.   Use only small amounts of olive, corn or canola oils. Avoid butter, mayonnaise, shortening or palm kernel oils. Avoid foods with trans-fats.   Use skim/nonfat milk and low-fat/nonfat yogurt and cheeses. Avoid whole milk, cream, ice cream, egg yolks and cheeses. Healthy desserts include angel food cake, gingersnaps, animal crackers, hard candy, popsicles, and low-fat/nonfat frozen yogurt. Avoid pastries, cakes, pies and cookies.   Exercise.   A regular program helps decrease LDL and raises HDL.   Helps with weight control.   Do things that increase your activity level like gardening, walking, or taking the stairs.   Medication.   May be prescribed by your caregiver to help lowering cholesterol and the risk for heart disease.   You may need medicine even if your levels are normal if you have several risk factors.  HOME CARE INSTRUCTIONS   Follow your diet and exercise programs as suggested by your caregiver.   Take medications as directed.   Have blood work done when your caregiver feels it is necessary.  MAKE SURE YOU:   Understand these instructions.   Will watch your condition.   Will get help right away if you are not doing well or get worse.  Document Released: 06/26/2005 Document Revised: 03/08/2011 Document Reviewed: 12/12/2006 [facility] Patient Information 2012 [place], [place].

## 2011-07-19 NOTE — Assessment & Plan Note (Signed)
She has risk factors for CVA and CAD so I have asked her to start crestor for risk reduction

## 2011-07-19 NOTE — Assessment & Plan Note (Signed)
I have asked her to restart advair diskus to control symptoms and to prevent asthma flares

## 2011-07-19 NOTE — Progress Notes (Signed)
Subjective:    Patient ID: Danielle Holt, female    DOB: 03/09/1956, 56 y.o.   MRN: [id number]  Asthma She complains of cough and wheezing. There is no chest tightness, difficulty breathing, frequent throat clearing, hemoptysis, hoarse voice, shortness of breath or sputum production. This is a recurrent problem. The current episode started more than 1 year ago. The problem occurs intermittently. The problem has been unchanged. The cough is non-productive. Associated symptoms include postnasal drip. Pertinent negatives include no appetite change, chest pain, dyspnea on exertion, ear congestion, ear pain, fever, headaches, heartburn, malaise/fatigue, myalgias, nasal congestion, orthopnea, PND, rhinorrhea, sneezing, sore throat, sweats, trouble swallowing or weight loss. Her symptoms are aggravated by change in weather. Her symptoms are alleviated by prescription cough suppressant and beta-agonist. She reports significant improvement on treatment. Risk factors for lung disease include smoking/tobacco exposure. Her past medical history is significant for asthma and COPD.  Hyperlipidemia This is a chronic problem. The current episode started more than 1 year ago. The problem is uncontrolled. Recent lipid tests were reviewed and are variable. Exacerbating diseases include obesity. She has no history of chronic renal disease, diabetes, hypothyroidism, liver disease or nephrotic syndrome. Factors aggravating her hyperlipidemia include fatty foods and smoking. Pertinent negatives include no chest pain, focal sensory loss, focal weakness, leg pain, myalgias or shortness of breath. She is currently on no antihyperlipidemic treatment. Compliance problems include adherence to exercise and adherence to diet.  Risk factors for coronary artery disease include dyslipidemia, obesity and post-menopausal.      Review of Systems  Constitutional: Negative for fever, chills, weight loss, malaise/fatigue, diaphoresis,  activity change, appetite change, fatigue and unexpected weight change.  HENT: Positive for congestion and postnasal drip. Negative for ear pain, nosebleeds, sore throat, hoarse voice, facial swelling, rhinorrhea, sneezing, drooling, mouth sores, trouble swallowing, neck pain, neck stiffness, dental problem, voice change and sinus pressure.   Eyes: Negative for photophobia, pain, discharge, redness, itching and visual disturbance.  Respiratory: Positive for cough and wheezing. Negative for hemoptysis, sputum production, chest tightness, shortness of breath and stridor.   Cardiovascular: Negative for chest pain, dyspnea on exertion, palpitations, leg swelling and PND.  Gastrointestinal: Negative for heartburn, nausea, vomiting, abdominal pain, diarrhea, constipation and blood in stool.  Genitourinary: Negative.   Musculoskeletal: Negative for myalgias, back pain, joint swelling, arthralgias and gait problem.  Skin: Negative for color change, pallor, rash and wound.  Neurological: Negative for dizziness, tremors, focal weakness, seizures, syncope, facial asymmetry, speech difficulty, weakness, light-headedness, numbness and headaches.  Hematological: Negative for adenopathy. Does not bruise/bleed easily.  Psychiatric/Behavioral: Negative.        Objective:   Physical Exam  Vitals reviewed. Constitutional: She is oriented to person, place, and time. She appears well-developed and well-nourished. No distress.  HENT:  Head: Normocephalic and atraumatic.  Mouth/Throat: Oropharynx is clear and moist. No oropharyngeal exudate.  Eyes: Conjunctivae are normal. Right eye exhibits no discharge. Left eye exhibits no discharge. No scleral icterus.  Neck: Normal range of motion. Neck supple. No JVD present. No tracheal deviation present. No thyromegaly present.  Cardiovascular: Normal rate, regular rhythm, normal heart sounds and intact distal pulses.  Exam reveals no gallop and no friction rub.   No  murmur heard. Pulmonary/Chest: Effort normal and breath sounds normal. No stridor. No respiratory distress. She has no wheezes. She has no rales. She exhibits no tenderness.  Abdominal: Soft. Bowel sounds are normal. She exhibits no distension and no mass. There is no tenderness. There is  no rebound and no guarding.  Musculoskeletal: Normal range of motion. She exhibits no edema and no tenderness.  Lymphadenopathy:    She has no cervical adenopathy.  Neurological: She is oriented to person, place, and time.  Skin: Skin is warm and dry. No rash noted. She is not diaphoretic. No erythema. No pallor.  Psychiatric: She has a normal mood and affect. Her behavior is normal. Judgment and thought content normal.      Lab Results  Component Value Date   WBC 9.3 07/14/2011   HGB 11.9* 07/14/2011   HCT 35.5* 07/14/2011   PLT 266.0 07/14/2011   GLUCOSE 104* 07/14/2011   CHOL 240* 07/14/2011   TRIG 150.0* 07/14/2011   HDL 80.30 07/14/2011   LDLDIRECT 128.5 07/14/2011   ALT 16 07/14/2011   AST 17 07/14/2011   NA 140 07/14/2011   K 3.8 07/14/2011   CL 106 07/14/2011   CREATININE 0.7 07/14/2011   BUN 15 07/14/2011   CO2 27 07/14/2011   TSH 2.44 07/14/2011      Assessment & Plan:

## 2011-08-16 ENCOUNTER — Ambulatory Visit (INDEPENDENT_AMBULATORY_CARE_PROVIDER_SITE_OTHER): Payer: Medicare Other | Admitting: Internal Medicine

## 2011-08-16 ENCOUNTER — Ambulatory Visit: Payer: Medicare Other | Admitting: Internal Medicine

## 2011-08-16 ENCOUNTER — Encounter: Payer: Self-pay | Admitting: Internal Medicine

## 2011-08-16 ENCOUNTER — Other Ambulatory Visit (INDEPENDENT_AMBULATORY_CARE_PROVIDER_SITE_OTHER): Payer: Medicare Other

## 2011-08-16 VITALS — BP 128/88 | HR 90 | Temp 98.2°F | Resp 16 | Wt 278.0 lb

## 2011-08-16 DIAGNOSIS — J449 Chronic obstructive pulmonary disease, unspecified: Secondary | ICD-10-CM

## 2011-08-16 DIAGNOSIS — E039 Hypothyroidism, unspecified: Secondary | ICD-10-CM

## 2011-08-16 DIAGNOSIS — J4530 Mild persistent asthma, uncomplicated: Secondary | ICD-10-CM

## 2011-08-16 DIAGNOSIS — E538 Deficiency of other specified B group vitamins: Secondary | ICD-10-CM

## 2011-08-16 DIAGNOSIS — D649 Anemia, unspecified: Secondary | ICD-10-CM

## 2011-08-16 DIAGNOSIS — R7309 Other abnormal glucose: Secondary | ICD-10-CM

## 2011-08-16 DIAGNOSIS — H9190 Unspecified hearing loss, unspecified ear: Secondary | ICD-10-CM | POA: Insufficient documentation

## 2011-08-16 DIAGNOSIS — F17200 Nicotine dependence, unspecified, uncomplicated: Secondary | ICD-10-CM

## 2011-08-16 DIAGNOSIS — K219 Gastro-esophageal reflux disease without esophagitis: Secondary | ICD-10-CM

## 2011-08-16 DIAGNOSIS — J45909 Unspecified asthma, uncomplicated: Secondary | ICD-10-CM

## 2011-08-16 LAB — CBC WITH DIFFERENTIAL/PLATELET
Basophils Relative: 0.7 % (ref 0.0–3.0)
Eosinophils Relative: 0.5 % (ref 0.0–5.0)
HCT: 36.3 % (ref 36.0–46.0)
Lymphs Abs: 2.8 10*3/uL (ref 0.7–4.0)
MCV: 87.6 fl (ref 78.0–100.0)
Monocytes Relative: 8.9 % (ref 3.0–12.0)
Neutrophils Relative %: 52.1 % (ref 43.0–77.0)
Platelets: 295 10*3/uL (ref 150.0–400.0)
RBC: 4.14 Mil/uL (ref 3.87–5.11)
WBC: 7.3 10*3/uL (ref 4.5–10.5)

## 2011-08-16 LAB — IBC PANEL: Transferrin: 247 mg/dL (ref 212.0–360.0)

## 2011-08-16 LAB — HEMOGLOBIN A1C: Hgb A1c MFr Bld: 6.2 % (ref 4.6–6.5)

## 2011-08-16 LAB — FOLATE: Folate: 6.5 ng/mL (ref >5.9)

## 2011-08-16 LAB — FERRITIN: Ferritin: 57 ng/mL (ref 10.0–291.0)

## 2011-08-16 MED ORDER — ROFLUMILAST 500 MCG PO TABS: 500.0000 ug | ORAL_TABLET | Freq: Every day | ORAL | Status: DC

## 2011-08-16 MED ORDER — DEXLANSOPRAZOLE 60 MG PO CPDR: 60.0000 mg | DELAYED_RELEASE_CAPSULE | Freq: Every day | ORAL | Status: DC

## 2011-08-16 MED ORDER — HYOSCYAMINE SULFATE ER 0.375 MG PO TB12: 0.3750 mg | ORAL_TABLET | Freq: Two times a day (BID) | ORAL | Status: DC | PRN

## 2011-08-16 NOTE — Assessment & Plan Note (Addendum)
I will check her a1c today, late note it confirms that she has really onset type II DM

## 2011-08-16 NOTE — Assessment & Plan Note (Signed)
She tells me that aciphex did not help much so I have changed her to dexilant and levbid, also have asked her to see GI in light of the other worrisome symptoms

## 2011-08-16 NOTE — Progress Notes (Signed)
Subjective:    Patient ID: Danielle Holt, female    DOB: 07-02-1956, 56 y.o.   MRN: [id number]  Heartburn She complains of heartburn and a hoarse voice. She reports no abdominal pain, no belching, no chest pain, no choking, no coughing, no dysphagia, no early satiety, no globus sensation, no nausea, no sore throat, no stridor, no tooth decay, no water brash or no wheezing. This is a chronic problem. The current episode started more than 1 year ago. The problem occurs frequently. The problem has been gradually worsening. The heartburn duration is several minutes. The heartburn is located in the substernum. The heartburn is of moderate intensity. The heartburn wakes her from sleep. The heartburn does not limit her activity. The heartburn doesn't change with position. Associated symptoms include anemia. Pertinent negatives include no fatigue, melena, muscle weakness, orthopnea or weight loss. Risk factors include smoking/tobacco exposure and obesity. She has tried a PPI for the symptoms. The treatment provided mild relief.      Review of Systems  Constitutional: Negative for chills, weight loss, diaphoresis, activity change, appetite change, fatigue and unexpected weight change.  HENT: Positive for hoarse voice. Negative for sore throat.   Eyes: Negative.   Respiratory: Negative for apnea, cough, choking, chest tightness, shortness of breath, wheezing and stridor.   Cardiovascular: Negative for chest pain, palpitations and leg swelling.  Gastrointestinal: Positive for heartburn. Negative for dysphagia, nausea, vomiting, abdominal pain, diarrhea, constipation, blood in stool, melena, abdominal distention and anal bleeding.  Genitourinary: Negative.   Musculoskeletal: Negative for myalgias, back pain, joint swelling, arthralgias, gait problem and muscle weakness.  Skin: Negative for color change, pallor, rash and wound.  Neurological: Negative for dizziness, tremors, seizures, syncope, facial  asymmetry, speech difficulty, weakness, light-headedness, numbness and headaches.  Hematological: Negative for adenopathy. Does not bruise/bleed easily.  Psychiatric/Behavioral: Negative.        Objective:   Physical Exam  Vitals reviewed. Constitutional: She is oriented to person, place, and time. She appears well-developed and well-nourished. No distress.  HENT:  Head: Normocephalic and atraumatic.  Mouth/Throat: Oropharynx is clear and moist. No oropharyngeal exudate.  Eyes: Conjunctivae are normal. Right eye exhibits no discharge. Left eye exhibits no discharge. No scleral icterus.  Neck: Normal range of motion. Neck supple. No JVD present. No tracheal deviation present. No thyromegaly present.  Cardiovascular: Normal rate, regular rhythm, normal heart sounds and intact distal pulses.  Exam reveals no gallop and no friction rub.   No murmur heard. Pulmonary/Chest: Effort normal and breath sounds normal. No stridor. No respiratory distress. She has no wheezes. She has no rales. She exhibits no tenderness.  Abdominal: Soft. Bowel sounds are normal. She exhibits no distension and no mass. There is no tenderness. There is no rebound and no guarding.  Musculoskeletal: Normal range of motion. She exhibits no edema and no tenderness.  Lymphadenopathy:    She has no cervical adenopathy.  Neurological: She is oriented to person, place, and time.  Skin: Skin is warm and dry. No rash noted. She is not diaphoretic. No erythema. No pallor.  Psychiatric: She has a normal mood and affect. Her behavior is normal. Judgment and thought content normal.      Lab Results  Component Value Date   WBC 9.3 07/14/2011   HGB 11.9* 07/14/2011   HCT 35.5* 07/14/2011   PLT 266.0 07/14/2011   GLUCOSE 104* 07/14/2011   CHOL 240* 07/14/2011   TRIG 150.0* 07/14/2011   HDL 80.30 07/14/2011   LDLDIRECT 128.5 07/14/2011  ALT 16 07/14/2011   AST 17 07/14/2011   NA 140 07/14/2011   K 3.8 07/14/2011   CL 106 07/14/2011   CREATININE  0.7 07/14/2011   BUN 15 07/14/2011   CO2 27 07/14/2011   TSH 2.44 07/14/2011      Assessment & Plan:

## 2011-08-16 NOTE — Patient Instructions (Signed)
Anemia, Nonspecific Your exam and blood tests show you are anemic. This means your blood (hemoglobin) level is low. Normal hemoglobin values are 12 to 15 g/dL for females and 14 to 17 g/dL for males. Make a note of your hemoglobin level today. The hematocrit percent is also used to measure anemia. A normal hematocrit is 38% to 46% in females and 42% to 49% in males. Make a note of your hematocrit level today. CAUSES  Anemia can be due to many different causes.  Excessive bleeding from periods (in women).   Intestinal bleeding.   Poor nutrition.   Kidney, thyroid, liver, and bone marrow diseases.  SYMPTOMS  Anemia can come on suddenly (acute). It can also come on slowly. Symptoms can include:  Minor weakness.   Dizziness.   Palpitations.   Shortness of breath.  Symptoms may be absent until half your hemoglobin is missing if it comes on slowly. Anemia due to acute blood loss from an injury or internal bleeding may require blood transfusion if the loss is severe. Hospital care is needed if you are anemic and there is significant continual blood loss. TREATMENT   Stool tests for blood (Hemoccult) and additional lab tests are often needed. This determines the best treatment.   Further checking on your condition and your response to treatment is very important. It often takes many weeks to correct anemia.  Depending on the cause, treatment can include:  Supplements of iron.   Vitamins B12 and folic acid.   Hormone medicines.If your anemia is due to bleeding, finding the cause of the blood loss is very important. This will help avoid further problems.  SEEK IMMEDIATE MEDICAL CARE IF:   You develop fainting, extreme weakness, shortness of breath, or chest pain.   You develop heavy vaginal bleeding.   You develop bloody or black, tarry stools or vomit up blood.   You develop a high fever, rash, repeated vomiting, or dehydration.  Document Released: 08/03/2004 Document Revised:  03/08/2011 Document Reviewed: 05/11/2009 [facility] Patient Information 2012 [place], [place].Gastroesophageal Reflux Disease, Adult Gastroesophageal reflux disease (GERD) happens when acid from your stomach flows up into the esophagus. When acid comes in contact with the esophagus, the acid causes soreness (inflammation) in the esophagus. Over time, GERD may create small holes (ulcers) in the lining of the esophagus. CAUSES   Increased body weight. This puts pressure on the stomach, making acid rise from the stomach into the esophagus.   Smoking. This increases acid production in the stomach.   Drinking alcohol. This causes decreased pressure in the lower esophageal sphincter (valve or ring of muscle between the esophagus and stomach), allowing acid from the stomach into the esophagus.   Late evening meals and a full stomach. This increases pressure and acid production in the stomach.   A malformed lower esophageal sphincter.  Sometimes, no cause is found. SYMPTOMS   Burning pain in the lower part of the mid-chest behind the breastbone and in the mid-stomach area. This may occur twice a week or more often.   Trouble swallowing.   Sore throat.   Dry cough.   Asthma-like symptoms including chest tightness, shortness of breath, or wheezing.  DIAGNOSIS  Your caregiver may be able to diagnose GERD based on your symptoms. In some cases, X-rays and other tests may be done to check for complications or to check the condition of your stomach and esophagus. TREATMENT  Your caregiver may recommend over-the-counter or prescription medicines to help decrease acid production. Ask your  caregiver before starting or adding any new medicines.  HOME CARE INSTRUCTIONS   Change the factors that you can control. Ask your caregiver for guidance concerning weight loss, quitting smoking, and alcohol consumption.   Avoid foods and drinks that make your symptoms worse, such as:   Caffeine or alcoholic drinks.    Chocolate.   Peppermint or mint flavorings.   Garlic and onions.   Spicy foods.   Citrus fruits, such as oranges, lemons, or limes.   Tomato-based foods such as sauce, chili, salsa, and pizza.   Fried and fatty foods.   Avoid lying down for the 3 hours prior to your bedtime or prior to taking a nap.   Eat small, frequent meals instead of large meals.   Wear loose-fitting clothing. Do not wear anything tight around your waist that causes pressure on your stomach.   Raise the head of your bed 6 to 8 inches with wood blocks to help you sleep. Extra pillows will not help.   Only take over-the-counter or prescription medicines for pain, discomfort, or fever as directed by your caregiver.   Do not take aspirin, ibuprofen, or other nonsteroidal anti-inflammatory drugs (NSAIDs).  SEEK IMMEDIATE MEDICAL CARE IF:   You have pain in your arms, neck, jaw, teeth, or back.   Your pain increases or changes in intensity or duration.   You develop nausea, vomiting, or sweating (diaphoresis).   You develop shortness of breath, or you faint.   Your vomit is green, yellow, black, or looks like coffee grounds or blood.   Your stool is red, bloody, or black.  These symptoms could be signs of other problems, such as heart disease, gastric bleeding, or esophageal bleeding. MAKE SURE YOU:   Understand these instructions.   Will watch your condition.   Will get help right away if you are not doing well or get worse.  Document Released: 04/05/2005 Document Revised: 03/08/2011 Document Reviewed: 01/13/2011 [facility] Patient Information 2012 [place], [place].

## 2011-08-16 NOTE — Assessment & Plan Note (Signed)
She agrees to quit smoking 

## 2011-08-16 NOTE — Assessment & Plan Note (Signed)
I will recheck her CBC and will look at her vitamin levels as well 

## 2011-08-16 NOTE — Assessment & Plan Note (Signed)
Audiology referral at her request 

## 2011-08-17 ENCOUNTER — Encounter: Payer: Self-pay | Admitting: *Deleted

## 2011-08-17 ENCOUNTER — Telehealth: Payer: Self-pay

## 2011-08-17 NOTE — Telephone Encounter (Signed)
[place] with [facility] called [facility] stating that insurance will not cover medication. Returned call to get more information, held x . Will try again later.

## 2011-08-19 ENCOUNTER — Encounter: Payer: Self-pay | Admitting: Internal Medicine

## 2011-08-19 NOTE — Assessment & Plan Note (Signed)
Her recent TSH was normal 

## 2011-08-19 NOTE — Assessment & Plan Note (Signed)
She agrees to quit smoking 

## 2011-08-24 ENCOUNTER — Encounter: Payer: Self-pay | Admitting: Gastroenterology

## 2011-08-24 ENCOUNTER — Ambulatory Visit (INDEPENDENT_AMBULATORY_CARE_PROVIDER_SITE_OTHER): Payer: Medicare Other | Admitting: Gastroenterology

## 2011-08-24 DIAGNOSIS — K219 Gastro-esophageal reflux disease without esophagitis: Secondary | ICD-10-CM

## 2011-08-24 DIAGNOSIS — K529 Noninfective gastroenteritis and colitis, unspecified: Secondary | ICD-10-CM | POA: Insufficient documentation

## 2011-08-24 DIAGNOSIS — E739 Lactose intolerance, unspecified: Secondary | ICD-10-CM | POA: Insufficient documentation

## 2011-08-24 DIAGNOSIS — R197 Diarrhea, unspecified: Secondary | ICD-10-CM

## 2011-08-24 DIAGNOSIS — D649 Anemia, unspecified: Secondary | ICD-10-CM

## 2011-08-24 DIAGNOSIS — E538 Deficiency of other specified B group vitamins: Secondary | ICD-10-CM

## 2011-08-24 MED ORDER — CYANOCOBALAMIN 1000 MCG/ML IJ SOLN
1000.0000 ug | INTRAMUSCULAR | Status: AC
  Administered 2011-08-24: 1000 ug via INTRAMUSCULAR

## 2011-08-24 MED ORDER — COLESTIPOL HCL 1 G PO TABS: 1.0000 g | ORAL_TABLET | Freq: Every day | ORAL | Status: DC

## 2011-08-24 NOTE — Progress Notes (Signed)
This is a obese 56 year old African American female with worsening GERD partially responsive to Dexilant 60 mg a day. She continues with burning substernal chest pain but denies true dysphagia. She has not had previous endoscopic exam. Colonoscopy several years ago was unremarkable. 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 She denies abuse of alcohol, cigarettes, or NSAIDs. There is no unexplained weight loss, hepatobiliary complaints, but she is status post cholecystectomy. Recent labs reviewed show borderline B12 levels. She previously was on B12 parenteral replacement therapy.  Current Medications, Allergies, Past Medical History, Past Surgical History, Family History and Social History were reviewed in [employer] record.  Pertinent Review of Systems Negative   Physical Exam: Healthy appearing patient with blood pressure 130/84. BMI is markedly elevated at 51. Cannot appreciate stigmata of chronic liver disease or thyromegaly. Chest is clear and she appears to be in a regular rhythm without murmurs gallops or rubs. She has an obese abdomen without definite organomegaly, masses or tenderness. Bowel sounds are normal. Peripheral extremities are unremarkable her mental status is normal.   Assessment and Plan: Worsening acid reflux probably related to obesity and noncompliance. I have scheduled her for endoscopic exam and we will check her for H. pylori infection. She is to continue daily Dexilant 60 mg 30 minutes before breakfast, and standard antireflux maneuvers reviewed. I've asked to discontinue sorbitol and other nonabsorbable carbohydrates in her diet. I also have started her on Colestid 1 g a day for possible element of bile salt enteropathy associated with previous cholecystectomy. Encounter Diagnoses  Name Primary?  Marland Kitchen Anemia   . Other B-complex deficiencies

## 2011-08-24 NOTE — Patient Instructions (Addendum)
Your procedure has been scheduled for 08/28/2011, please follow the seperate instructions.  Your prescription(s) have been sent to you pharmacy.  Today you got a b12 injection you will need these monthly please make an appt to come back in one month and get your next injection.  Follow the FODMAP diet given today.

## 2011-08-28 ENCOUNTER — Encounter: Payer: Self-pay | Admitting: Gastroenterology

## 2011-08-28 ENCOUNTER — Ambulatory Visit (AMBULATORY_SURGERY_CENTER): Payer: PRIVATE HEALTH INSURANCE | Admitting: Gastroenterology

## 2011-08-28 DIAGNOSIS — R197 Diarrhea, unspecified: Secondary | ICD-10-CM

## 2011-08-28 DIAGNOSIS — K219 Gastro-esophageal reflux disease without esophagitis: Secondary | ICD-10-CM

## 2011-08-28 DIAGNOSIS — E538 Deficiency of other specified B group vitamins: Secondary | ICD-10-CM

## 2011-08-28 DIAGNOSIS — D649 Anemia, unspecified: Secondary | ICD-10-CM

## 2011-08-28 DIAGNOSIS — R635 Abnormal weight gain: Secondary | ICD-10-CM

## 2011-08-28 MED ORDER — SODIUM CHLORIDE 0.9 % IV SOLN: 500.0000 mL | INTRAVENOUS | Status: DC

## 2011-08-28 NOTE — Op Note (Signed)
[facility] [address] [place], [place]  [address]  ENDOSCOPY PROCEDURE REPORT  PATIENT:  Danielle Holt, Danielle Holt  MR#:  [id number] BIRTHDATE:  [birth year], 55 yrs. old  GENDER:  female  ENDOSCOPIST:  Vania Rea. Jarold Motto, MD, [facility] Referred by:  PROCEDURE DATE:  08/28/2011 PROCEDURE:  EGD with biopsy for H. pylori 19147 ASA CLASS:  Class III INDICATIONS:  refractory gerd  MEDICATIONS:   propofol (Diprivan) 150 mg IV TOPICAL ANESTHETIC:  DESCRIPTION OF PROCEDURE:   After the risks and benefits of the procedure were explained, informed consent was obtained.  The LB GIF-H180 [id number] endoscope was introduced through the mouth and advanced to the second portion of the duodenum.  The instrument was slowly withdrawn as the mucosa was fully examined. <<PROCEDUREIMAGES>>  A hiatal hernia was found. free REFLUX.VERY DIFFICULT EXAM.CONSTANT RETYCHING DESPITE PROPOFOL RX,,,  A stricture was found at the gastroesophageal junction. EARLY STRICTURES NOTED.SEE PICTURES.  Otherwise normal duodenum.  Otherwise normal stomach. CLO BX. DONE.    Retroflexed views revealed not done.  COULD NOT DO PER RETCHING.  The scope was then withdrawn from the patient and the procedure completed.  COMPLICATIONS:  None  ENDOSCOPIC IMPRESSION: 1) Hiatal hernia 2) Stricture at the gastroesophageal junction 3) Otherwise normal duodenum 4) Otherwise normal stomach 5) Not done GERD FROM OBESITY AND ?? NONCOMPLIANCE.NOT A SURGICAL VANDIDATE.  RECOMMENDATIONS: 1) Anti-reflux regimen to be follow CONTINUE DEXILANT 60 MG /DAY  ______________________________ Vania Rea. Jarold Motto, MD, [facility]  CC:  n. eSIGNED:   Vania Rea. Allecia Bells at 08/28/2011 09:26 AM  Lucio Edward, [phone]

## 2011-08-28 NOTE — Progress Notes (Signed)
PT. sof ats decreased to 77% oxygen increased to 4L. Large amount of secreation suctioned.b/p decreased. Aroused pt.in procedure room head of bed elevated. Pt verbally responsive prior to leaving procedure room,b/p 108/78.

## 2011-08-28 NOTE — Progress Notes (Signed)
PATIENT WITH OUT DYSPNEA. PATIENT DOES HAVE A DEEP BRONCHIAL SOUNDING COUGH. PATIENT STATING SHE HAS HAD A BRONCHITIS IN LAST MONTH. NON PRODUCTIVE COUGH ONLY OCCASIONALLY.

## 2011-08-28 NOTE — Progress Notes (Signed)
Patient did not experience any of the following events: a burn prior to discharge; a fall within the facility; wrong site/side/patient/procedure/implant event; or a hospital transfer or hospital admission upon discharge from the facility. (G8907) Patient did not have preoperative order for IV antibiotic SSI prophylaxis. (G8918)  

## 2011-08-28 NOTE — Progress Notes (Signed)
PATIENT TO BATHROOM FOLLOWING DRESSING. PATIENT INCONTINENT OF STOOL. ASSISTED THE PAT. TO CLEAN CLOTHING AND HER SKIN.Danielle Holt PATIENT WITH CAREGIVER AWAITING TAXI IN RECLINER ROOM.

## 2011-08-28 NOTE — Patient Instructions (Signed)
YOU HAD AN ENDOSCOPIC PROCEDURE TODAY AT THE [facility]: Refer to the procedure report that was given to you for any specific questions about what was found during the examination.  If the procedure report does not answer your questions, please call your gastroenterologist to clarify.  If you requested that your care partner not be given the details of your procedure findings, then the procedure report has been included in a sealed envelope for you to review at your convenience later.  YOU SHOULD EXPECT: Some feelings of bloating in the abdomen. Passage of more gas than usual.  Walking can help get rid of the air that was put into your GI tract during the procedure and reduce the bloating. If you had a lower endoscopy (such as a colonoscopy or flexible sigmoidoscopy) you may notice spotting of blood in your stool or on the toilet paper. If you underwent a bowel prep for your procedure, then you may not have a normal bowel movement for a few days.  DIET: Your first meal following the procedure should be a light meal and then it is ok to progress to your normal diet.  A half-sandwich or bowl of soup is an example of a good first meal.  Heavy or fried foods are harder to digest and may make you feel nauseous or bloated.  Likewise meals heavy in dairy and vegetables can cause extra gas to form and this can also increase the bloating.  Drink plenty of fluids but you should avoid alcoholic beverages for 24 hours.  ACTIVITY: Your care partner should take you home directly after the procedure.  You should plan to take it easy, moving slowly for the rest of the day.  You can resume normal activity the day after the procedure however you should NOT DRIVE or use heavy machinery for 24 hours (because of the sedation medicines used during the test).    SYMPTOMS TO REPORT IMMEDIATELY: A gastroenterologist can be reached at any hour.  During normal business hours, 8:30 AM to 5:00 PM Monday through Friday,  call [phone].  After hours and on weekends, please call the GI answering service at [phone] who will take a message and have the physician on call contact you.   Following lower endoscopy (colonoscopy or flexible sigmoidoscopy):  Excessive amounts of blood in the stool  Significant tenderness or worsening of abdominal pains  Swelling of the abdomen that is new, acute  Fever of 100F or higher  Following upper endoscopy (EGD)  Vomiting of blood or coffee ground material  New chest pain or pain under the shoulder blades  Painful or persistently difficult swallowing  New shortness of breath  Fever of 100F or higher  Black, tarry-looking stools  FOLLOW UP: If any biopsies were taken you will be contacted by phone or by letter within the next 1-3 weeks.  Call your gastroenterologist if you have not heard about the biopsies in 3 weeks.  Our staff will call the home number listed on your records the next business day following your procedure to check on you and address any questions or concerns that you may have at that time regarding the information given to you following your procedure. This is a courtesy call and so if there is no answer at the home number and we have not heard from you through the emergency physician on call, we will assume that you have returned to your regular daily activities without incident.  SIGNATURES/CONFIDENTIALITY: You and/or your care   partner have signed paperwork which will be entered into your electronic medical record.  These signatures attest to the fact that that the information above on your After Visit Summary has been reviewed and is understood.  Full responsibility of the confidentiality of this discharge information lies with you and/or your care-partner.  

## 2011-08-29 ENCOUNTER — Ambulatory Visit: Payer: Medicare Other | Admitting: Internal Medicine

## 2011-08-29 ENCOUNTER — Telehealth: Payer: Self-pay | Admitting: *Deleted

## 2011-08-29 NOTE — Telephone Encounter (Signed)
  Follow up Call-  Call back number 08/28/2011  Post procedure Call Back phone  # [phone]  Permission to leave phone message Yes     Patient questions:  Do you have a fever, pain , or abdominal swelling? no Pain Score  0 *  Have you tolerated food without any problems? yes  Have you been able to return to your normal activities? yes  Do you have any questions about your discharge instructions: Diet   no Medications  no Follow up visit  no  Do you have questions or concerns about your Care? no  Actions: * If pain score is 4 or above: No action needed, pain <4.

## 2011-08-30 ENCOUNTER — Encounter: Payer: Self-pay | Admitting: Gastroenterology

## 2011-09-06 ENCOUNTER — Ambulatory Visit (INDEPENDENT_AMBULATORY_CARE_PROVIDER_SITE_OTHER): Payer: 59 | Admitting: Internal Medicine

## 2011-09-06 ENCOUNTER — Encounter: Payer: Self-pay | Admitting: Internal Medicine

## 2011-09-06 DIAGNOSIS — M129 Arthropathy, unspecified: Secondary | ICD-10-CM

## 2011-09-06 DIAGNOSIS — E785 Hyperlipidemia, unspecified: Secondary | ICD-10-CM

## 2011-09-06 DIAGNOSIS — E538 Deficiency of other specified B group vitamins: Secondary | ICD-10-CM

## 2011-09-06 MED ORDER — HYDROCODONE-IBUPROFEN 7.5-200 MG PO TABS: 1.0000 | ORAL_TABLET | Freq: Three times a day (TID) | ORAL | Status: DC | PRN

## 2011-09-06 NOTE — Assessment & Plan Note (Signed)
She does not need any meds at this time 

## 2011-09-06 NOTE — Progress Notes (Signed)
  Subjective:    Patient ID: Danielle Holt, female    DOB: December 23, 1955, 56 y.o.   MRN: [id number]  Arthritis Presents for follow-up visit. She complains of pain. She reports no stiffness, joint swelling or joint warmth. The symptoms have been stable. Affected locations include the left knee, right knee, left hip, right hip, right shoulder and left shoulder. Pertinent negatives include no diarrhea, dry eyes, dry mouth, dysuria, fatigue, fever, pain at night, pain while resting, rash, Raynaud's syndrome, uveitis or weight loss. Compliance with total regimen is 51-75%. Compliance with medications is 51-75%.      Review of Systems  Constitutional: Negative for fever, chills, weight loss, diaphoresis, activity change, appetite change, fatigue and unexpected weight change.  HENT: Negative.   Eyes: Negative.   Respiratory: Negative for cough, chest tightness, shortness of breath, wheezing and stridor.   Cardiovascular: Negative for chest pain, palpitations and leg swelling.  Gastrointestinal: Negative for nausea, vomiting, abdominal pain, diarrhea, constipation, blood in stool and abdominal distention.  Genitourinary: Negative for dysuria, urgency, frequency, hematuria, flank pain, decreased urine volume, enuresis, difficulty urinating and dyspareunia.  Musculoskeletal: Positive for arthralgias and arthritis. Negative for myalgias, back pain, joint swelling, gait problem and stiffness.  Skin: Negative for color change, pallor, rash and wound.  Neurological: Negative.   Hematological: Negative for adenopathy. Does not bruise/bleed easily.  Psychiatric/Behavioral: Negative.        Objective:   Physical Exam  Vitals reviewed. Constitutional: She is oriented to person, place, and time. She appears well-developed and well-nourished. No distress.  HENT:  Head: Normocephalic and atraumatic.  Nose: Nose normal.  Mouth/Throat: Oropharynx is clear and moist. No oropharyngeal exudate.  Eyes:  Conjunctivae are normal. Right eye exhibits no discharge. Left eye exhibits no discharge. No scleral icterus.  Neck: Normal range of motion. Neck supple. No JVD present. No tracheal deviation present. No thyromegaly present.  Cardiovascular: Normal rate, regular rhythm, normal heart sounds and intact distal pulses.  Exam reveals no gallop and no friction rub.   No murmur heard. Pulmonary/Chest: Effort normal and breath sounds normal. No stridor. No respiratory distress. She has no wheezes. She has no rales. She exhibits no tenderness.  Abdominal: Soft. Bowel sounds are normal. She exhibits no distension. There is no tenderness. There is no rebound and no guarding.  Musculoskeletal: Normal range of motion. She exhibits no edema and no tenderness.  Lymphadenopathy:    She has no cervical adenopathy.  Neurological: She is oriented to person, place, and time.  Skin: Skin is warm and dry. No rash noted. She is not diaphoretic. No erythema. No pallor.  Psychiatric: She has a normal mood and affect. Her behavior is normal. Judgment and thought content normal.      Lab Results  Component Value Date   WBC 7.3 08/16/2011   HGB 12.2 08/16/2011   HCT 36.3 08/16/2011   PLT 295.0 08/16/2011   GLUCOSE 104* 07/14/2011   CHOL 240* 07/14/2011   TRIG 150.0* 07/14/2011   HDL 80.30 07/14/2011   LDLDIRECT 128.5 07/14/2011   ALT 16 07/14/2011   AST 17 07/14/2011   NA 140 07/14/2011   K 3.8 07/14/2011   CL 106 07/14/2011   CREATININE 0.7 07/14/2011   BUN 15 07/14/2011   CO2 27 07/14/2011   TSH 2.44 07/14/2011   HGBA1C 6.2 08/16/2011      Assessment & Plan:

## 2011-09-06 NOTE — Assessment & Plan Note (Signed)
Continue B12 injections.   

## 2011-09-06 NOTE — Patient Instructions (Signed)

## 2011-09-06 NOTE — Assessment & Plan Note (Signed)
Continue vicoprofen as needed

## 2011-09-20 ENCOUNTER — Ambulatory Visit (INDEPENDENT_AMBULATORY_CARE_PROVIDER_SITE_OTHER): Payer: 59

## 2011-09-20 DIAGNOSIS — E538 Deficiency of other specified B group vitamins: Secondary | ICD-10-CM

## 2011-09-20 MED ORDER — CYANOCOBALAMIN 1000 MCG/ML IJ SOLN
1000.0000 ug | Freq: Once | INTRAMUSCULAR | Status: AC
  Administered 2011-09-20: 1000 ug via INTRAMUSCULAR

## 2011-09-21 ENCOUNTER — Telehealth: Payer: Self-pay

## 2011-09-22 ENCOUNTER — Telehealth: Payer: Self-pay

## 2011-09-22 MED ORDER — DEXLANSOPRAZOLE 60 MG PO CPDR: 60.0000 mg | DELAYED_RELEASE_CAPSULE | Freq: Two times a day (BID) | ORAL | Status: DC

## 2011-09-22 MED ORDER — ZIPRASIDONE HCL 40 MG PO CAPS: 40.0000 mg | ORAL_CAPSULE | Freq: Every day | ORAL | Status: DC

## 2011-09-22 MED ORDER — BUPROPION HCL 75 MG PO TABS: 75.0000 mg | ORAL_TABLET | Freq: Every day | ORAL | Status: DC

## 2011-09-22 NOTE — Telephone Encounter (Signed)
Refill request/ original script not sent

## 2011-09-27 NOTE — Telephone Encounter (Signed)
Rx called in, pt notified

## 2011-10-18 ENCOUNTER — Encounter: Payer: Self-pay | Admitting: Internal Medicine

## 2011-10-18 ENCOUNTER — Ambulatory Visit (INDEPENDENT_AMBULATORY_CARE_PROVIDER_SITE_OTHER): Payer: 59 | Admitting: Internal Medicine

## 2011-10-18 VITALS — BP 120/74 | HR 80 | Temp 97.4°F | Resp 16 | Wt 264.0 lb

## 2011-10-18 DIAGNOSIS — F3289 Other specified depressive episodes: Secondary | ICD-10-CM

## 2011-10-18 DIAGNOSIS — E785 Hyperlipidemia, unspecified: Secondary | ICD-10-CM

## 2011-10-18 DIAGNOSIS — F329 Major depressive disorder, single episode, unspecified: Secondary | ICD-10-CM

## 2011-10-18 DIAGNOSIS — E538 Deficiency of other specified B group vitamins: Secondary | ICD-10-CM

## 2011-10-18 DIAGNOSIS — IMO0001 Reserved for inherently not codable concepts without codable children: Secondary | ICD-10-CM

## 2011-10-18 MED ORDER — VILAZODONE HCL 10 & 20 & 40 MG PO KIT: 1.0000 | PACK | Freq: Every day | ORAL | Status: DC

## 2011-10-18 MED ORDER — COLESEVELAM HCL 3.75 G PO PACK: 1.0000 | PACK | Freq: Every day | ORAL | Status: DC

## 2011-10-18 MED ORDER — CYANOCOBALAMIN 1000 MCG/ML IJ SOLN
1000.0000 ug | Freq: Once | INTRAMUSCULAR | Status: AC
  Administered 2011-10-18: 1000 ug via INTRAMUSCULAR

## 2011-10-18 NOTE — Assessment & Plan Note (Signed)
She will continue wellbutrin and ativan, but today I have asked her to try viibryd

## 2011-10-18 NOTE — Patient Instructions (Signed)

## 2011-10-18 NOTE — Assessment & Plan Note (Signed)
Continue crestor and start welchol

## 2011-10-18 NOTE — Progress Notes (Signed)
  Subjective:    Patient ID: Danielle Holt, female    DOB: 1955-10-13, 56 y.o.   MRN: [id number]  Anxiety Presents for follow-up visit. Symptoms include depressed mood, excessive worry, irritability, malaise, muscle tension and nervous/anxious behavior. Patient reports no chest pain, compulsions, confusion, decreased concentration, dizziness, dry mouth, feeling of choking, hyperventilation, insomnia, nausea, obsessions, palpitations, panic, restlessness, shortness of breath or suicidal ideas. Symptoms occur occasionally. The severity of symptoms is mild. The quality of sleep is fair. Nighttime awakenings: occasional.        Review of Systems  Constitutional: Positive for irritability. Negative for fever, chills, diaphoresis, activity change, appetite change, fatigue and unexpected weight change.  HENT: Negative.   Respiratory: Negative for cough, chest tightness, shortness of breath, wheezing and stridor.   Cardiovascular: Negative for chest pain, palpitations and leg swelling.  Gastrointestinal: Negative for nausea, vomiting, abdominal pain, diarrhea, constipation, blood in stool and abdominal distention.  Genitourinary: Negative.   Musculoskeletal: Negative.   Skin: Negative for color change, pallor, rash and wound.  Neurological: Negative.  Negative for dizziness.  Hematological: Negative for adenopathy. Does not bruise/bleed easily.  Psychiatric/Behavioral: Negative for suicidal ideas, confusion and decreased concentration. The patient is nervous/anxious. The patient does not have insomnia.        Objective:   Physical Exam  Vitals reviewed. Constitutional: She is oriented to person, place, and time. She appears well-developed and well-nourished. No distress.  HENT:  Head: Normocephalic and atraumatic.  Mouth/Throat: Oropharynx is clear and moist. No oropharyngeal exudate.  Eyes: Conjunctivae are normal. Right eye exhibits no discharge. Left eye exhibits no discharge. No  scleral icterus.  Neck: Normal range of motion. Neck supple. No JVD present. No tracheal deviation present. No thyromegaly present.  Cardiovascular: Normal rate, regular rhythm, normal heart sounds and intact distal pulses.  Exam reveals no gallop and no friction rub.   No murmur heard. Pulmonary/Chest: Effort normal and breath sounds normal. No stridor. No respiratory distress. She has no wheezes. She has no rales. She exhibits no tenderness.  Abdominal: Soft. Bowel sounds are normal. She exhibits no distension and no mass. There is no tenderness. There is no rebound and no guarding.  Musculoskeletal: Normal range of motion. She exhibits no edema and no tenderness.  Lymphadenopathy:    She has no cervical adenopathy.  Neurological: She is oriented to person, place, and time.  Skin: Skin is warm and dry. No rash noted. She is not diaphoretic. No erythema. No pallor.  Psychiatric: She has a normal mood and affect. Her behavior is normal. Judgment and thought content normal.      Lab Results  Component Value Date   WBC 7.3 08/16/2011   HGB 12.2 08/16/2011   HCT 36.3 08/16/2011   PLT 295.0 08/16/2011   GLUCOSE 104* 07/14/2011   CHOL 240* 07/14/2011   TRIG 150.0* 07/14/2011   HDL 80.30 07/14/2011   LDLDIRECT 128.5 07/14/2011   ALT 16 07/14/2011   AST 17 07/14/2011   NA 140 07/14/2011   K 3.8 07/14/2011   CL 106 07/14/2011   CREATININE 0.7 07/14/2011   BUN 15 07/14/2011   CO2 27 07/14/2011   TSH 2.44 07/14/2011   HGBA1C 6.2 08/16/2011      Assessment & Plan:

## 2011-10-18 NOTE — Assessment & Plan Note (Signed)
Start welchol 

## 2011-11-16 ENCOUNTER — Ambulatory Visit (INDEPENDENT_AMBULATORY_CARE_PROVIDER_SITE_OTHER): Payer: PRIVATE HEALTH INSURANCE | Admitting: Internal Medicine

## 2011-11-16 ENCOUNTER — Encounter: Payer: Self-pay | Admitting: Internal Medicine

## 2011-11-16 VITALS — BP 118/72 | HR 60 | Temp 98.8°F | Resp 16 | Wt 266.1 lb

## 2011-11-16 DIAGNOSIS — E538 Deficiency of other specified B group vitamins: Secondary | ICD-10-CM

## 2011-11-16 DIAGNOSIS — E039 Hypothyroidism, unspecified: Secondary | ICD-10-CM

## 2011-11-16 DIAGNOSIS — D649 Anemia, unspecified: Secondary | ICD-10-CM

## 2011-11-16 DIAGNOSIS — F17200 Nicotine dependence, unspecified, uncomplicated: Secondary | ICD-10-CM

## 2011-11-16 DIAGNOSIS — F329 Major depressive disorder, single episode, unspecified: Secondary | ICD-10-CM

## 2011-11-16 DIAGNOSIS — E785 Hyperlipidemia, unspecified: Secondary | ICD-10-CM

## 2011-11-16 MED ORDER — VILAZODONE HCL 40 MG PO TABS: 40.0000 mg | ORAL_TABLET | Freq: Every day | ORAL | Status: DC

## 2011-11-16 NOTE — Patient Instructions (Signed)

## 2011-11-17 ENCOUNTER — Encounter: Payer: Self-pay | Admitting: Internal Medicine

## 2011-11-17 NOTE — Assessment & Plan Note (Signed)
She is doing well on crestor but is not taking welchol, I will check her FLP and CMP today

## 2011-11-17 NOTE — Progress Notes (Signed)
Subjective:    Patient ID: Danielle Holt, female    DOB: 26-Dec-1955, 56 y.o.   MRN: [id number]  Diabetes She presents for her follow-up diabetic visit. She has type 2 diabetes mellitus. Her disease course has been stable. There are no hypoglycemic associated symptoms. Pertinent negatives for hypoglycemia include no confusion or nervousness/anxiousness. Pertinent negatives for diabetes include no fatigue, no foot paresthesias, no foot ulcerations, no polydipsia, no polyphagia, no polyuria, no visual change, no weakness and no weight loss. There are no hypoglycemic complications. Symptoms are stable. There are no diabetic complications. Current diabetic treatment includes diet. She is compliant with treatment some of the time. Her weight is increasing steadily. She is following a generally unhealthy diet. When asked about meal planning, she reported none. She never participates in exercise. Her home blood glucose trend is decreasing steadily. Her breakfast blood glucose range is generally 70-90 mg/dl. Her lunch blood glucose range is generally 110-130 mg/dl. Her dinner blood glucose range is generally 110-130 mg/dl. Her highest blood glucose is 130-140 mg/dl. Her overall blood glucose range is 110-130 mg/dl. She does not see a podiatrist.Eye exam is not current.      Review of Systems  Constitutional: Positive for unexpected weight change (some weight gain). Negative for fever, chills, weight loss, diaphoresis, activity change, appetite change and fatigue.  HENT: Negative.   Eyes: Negative.   Respiratory: Negative for cough, chest tightness, wheezing and stridor.   Cardiovascular: Negative for leg swelling.  Gastrointestinal: Negative for nausea, vomiting, abdominal pain, diarrhea, constipation and blood in stool.  Genitourinary: Negative.  Negative for polyuria.  Musculoskeletal: Negative for myalgias, back pain, joint swelling, arthralgias and gait problem.  Skin: Negative.   Neurological:  Negative.  Negative for weakness.  Hematological: Negative for polydipsia, polyphagia and adenopathy. Does not bruise/bleed easily.  Psychiatric/Behavioral: Negative for suicidal ideas, hallucinations, behavioral problems, confusion, sleep disturbance, self-injury, dysphoric mood, decreased concentration and agitation. The patient is not nervous/anxious and is not hyperactive.        Objective:   Physical Exam  Vitals reviewed. Constitutional: She is oriented to person, place, and time. She appears well-developed and well-nourished. No distress.  HENT:  Head: Normocephalic and atraumatic.  Mouth/Throat: Oropharynx is clear and moist. No oropharyngeal exudate.  Eyes: Conjunctivae are normal. Right eye exhibits no discharge. Left eye exhibits no discharge. No scleral icterus.  Neck: Normal range of motion. Neck supple. No JVD present. No tracheal deviation present. No thyromegaly present.  Cardiovascular: Normal rate, regular rhythm, normal heart sounds and intact distal pulses.  Exam reveals no gallop and no friction rub.   No murmur heard. Pulmonary/Chest: Effort normal and breath sounds normal. No stridor. No respiratory distress. She has no wheezes. She has no rales. She exhibits no tenderness.  Abdominal: Soft. Bowel sounds are normal. She exhibits no distension and no mass. There is no tenderness. There is no rebound and no guarding.  Musculoskeletal: Normal range of motion. She exhibits edema (2+ edema in BLE). She exhibits no tenderness.  Lymphadenopathy:    She has no cervical adenopathy.  Neurological: She is oriented to person, place, and time.  Skin: Skin is warm and dry. No rash noted. She is not diaphoretic. No erythema. No pallor.  Psychiatric: She has a normal mood and affect. Her behavior is normal. Judgment and thought content normal.      Lab Results  Component Value Date   WBC 7.3 08/16/2011   HGB 12.2 08/16/2011   HCT 36.3 08/16/2011  PLT 295.0 08/16/2011   GLUCOSE 104*  07/14/2011   CHOL 240* 07/14/2011   TRIG 150.0* 07/14/2011   HDL 80.30 07/14/2011   LDLDIRECT 128.5 07/14/2011   ALT 16 07/14/2011   AST 17 07/14/2011   NA 140 07/14/2011   K 3.8 07/14/2011   CL 106 07/14/2011   CREATININE 0.7 07/14/2011   BUN 15 07/14/2011   CO2 27 07/14/2011   TSH 2.44 07/14/2011   HGBA1C 6.2 08/16/2011      Assessment & Plan:

## 2011-11-17 NOTE — Assessment & Plan Note (Signed)
She is not taking welchol and has not done well with her lifestyle changes, I will check her a1c today and see is she needs meds for DM

## 2011-11-17 NOTE — Assessment & Plan Note (Addendum)
She is trying to quit. 

## 2011-11-17 NOTE — Assessment & Plan Note (Signed)
I will check her TSH today 

## 2011-11-17 NOTE — Assessment & Plan Note (Signed)
CBC today.  

## 2011-11-17 NOTE — Assessment & Plan Note (Signed)
She is doing well on Viibryd

## 2011-11-29 ENCOUNTER — Ambulatory Visit: Payer: 59 | Admitting: Internal Medicine

## 2011-12-02 IMAGING — CR DG FOOT COMPLETE 3+V*R*
3 series · 3 of 3 positions shown · non-contrast
Comparison: None

CLINICAL DATA: Right foot pain for 1 month

RIGHT FOOT COMPLETE - 3+ VIEW

[view not recorded (1 of 3)]
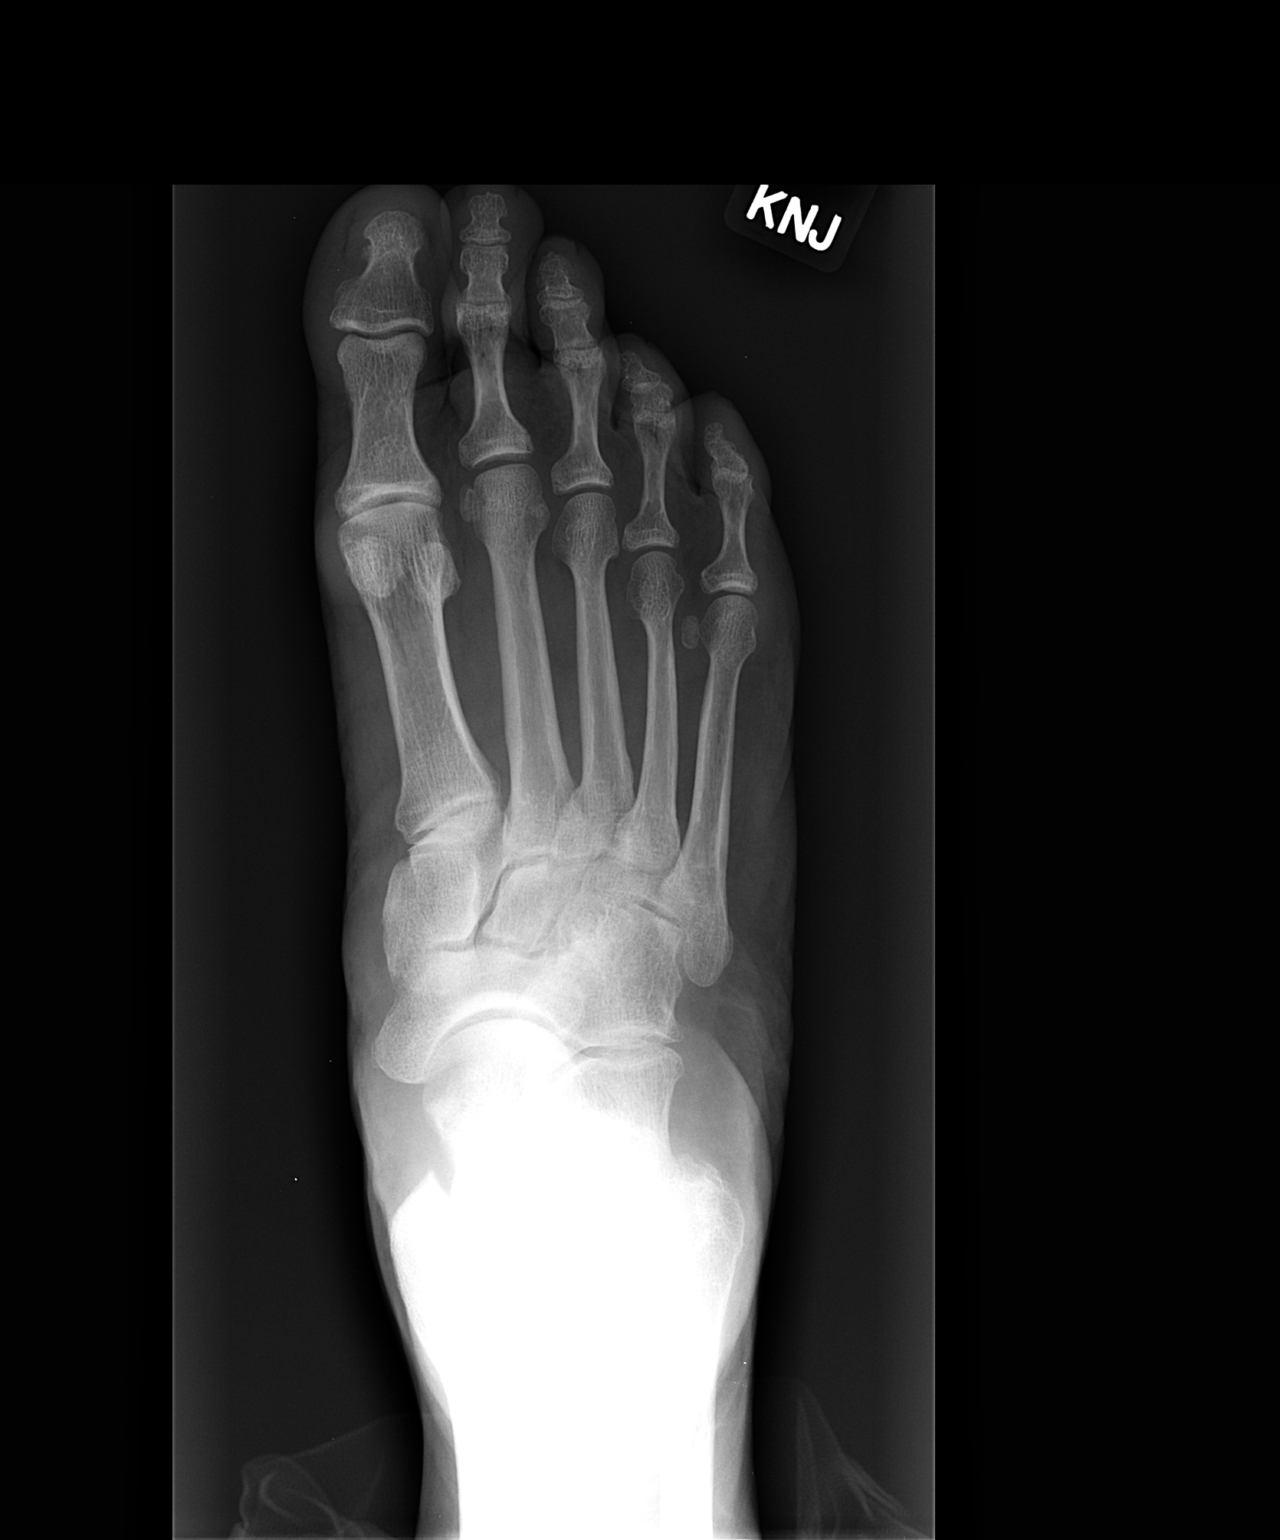

[view not recorded (2 of 3)]
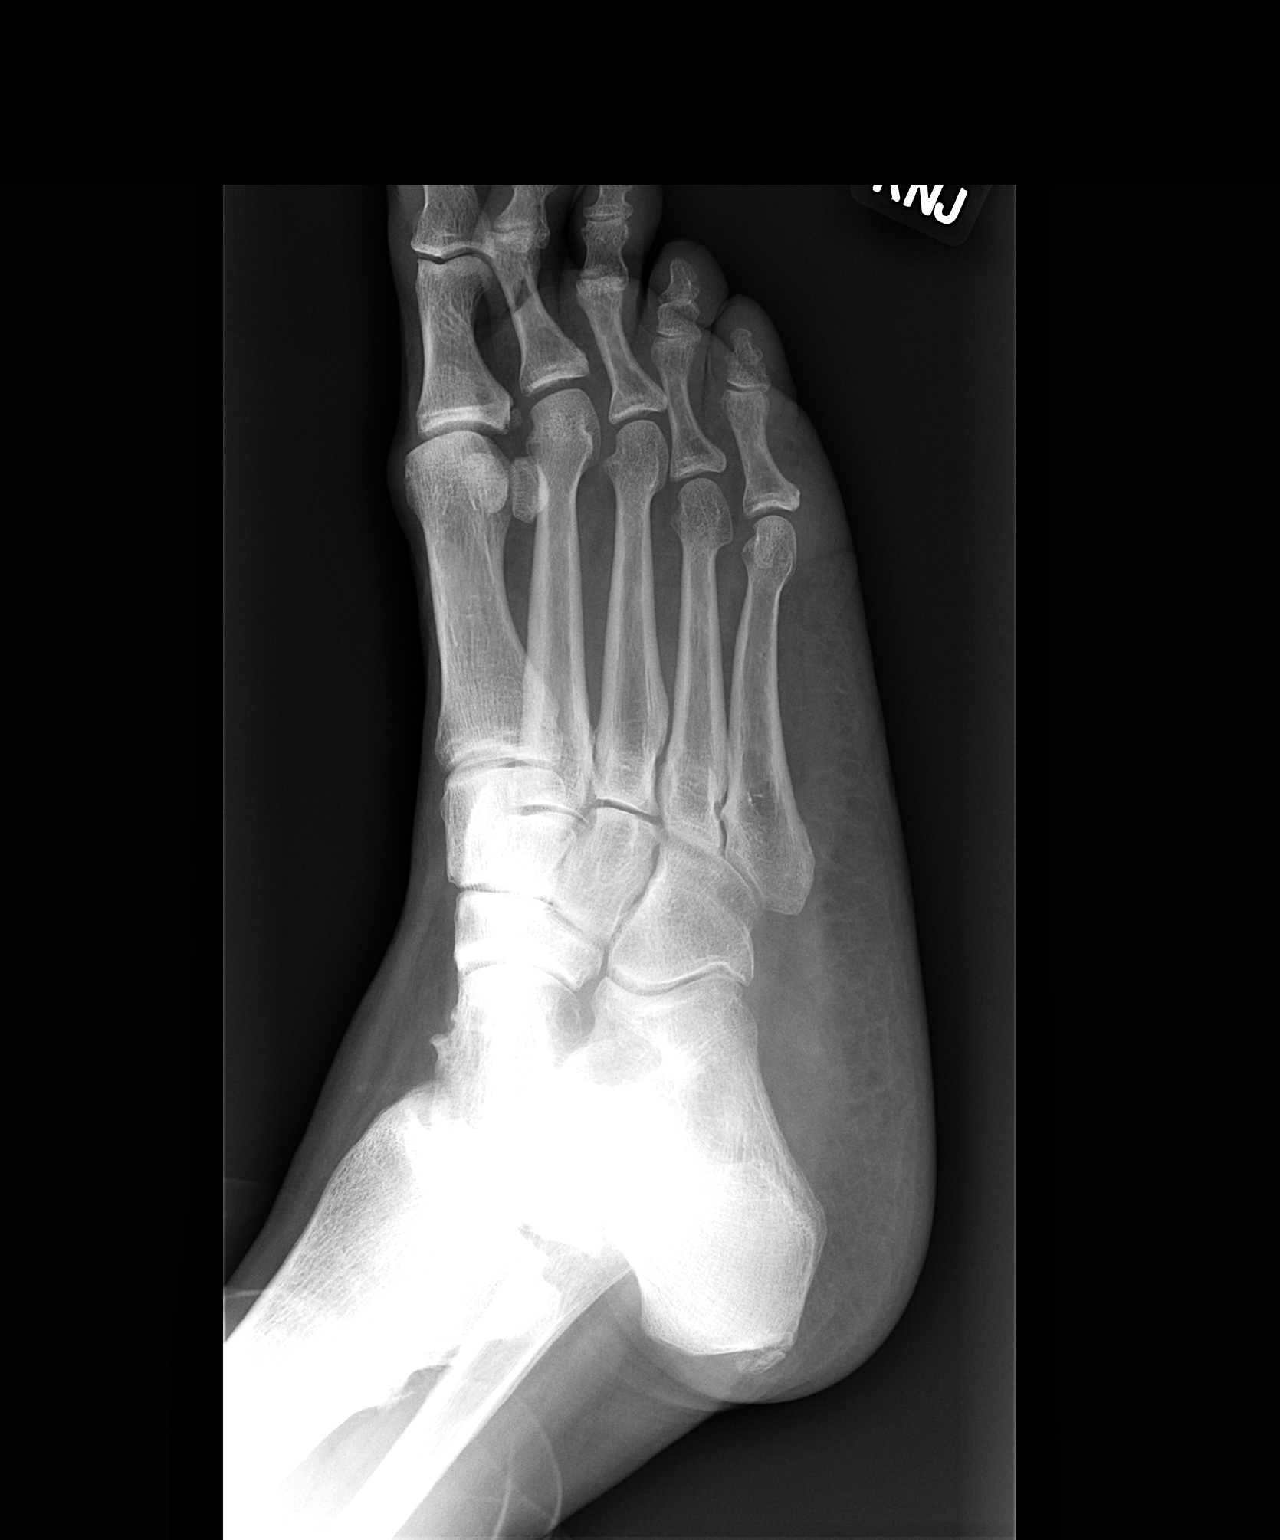

[view not recorded (3 of 3)]
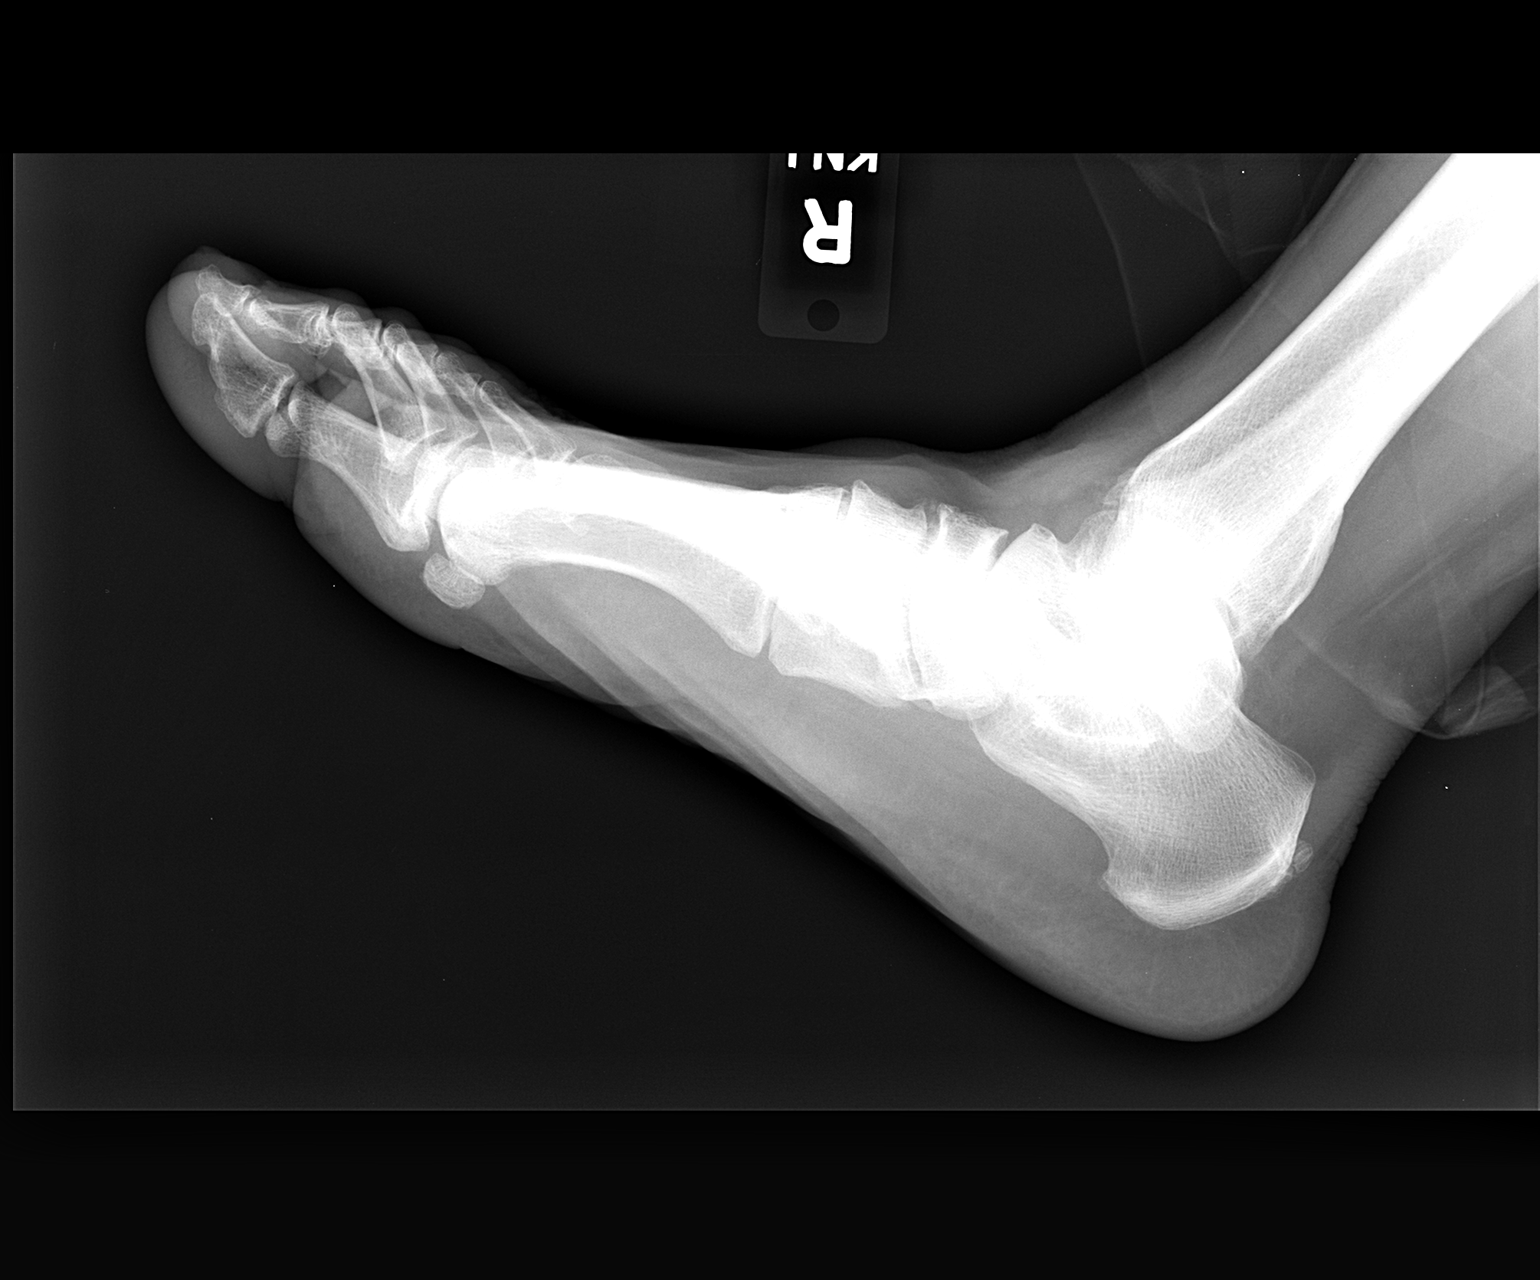

[3 of 3 positions shown; findings below may reference images not displayed]

FINDINGS: Bone mineralization normal.
Joint spaces preserved.
No fracture, dislocation, or bone destruction.
IMPRESSION: Normal exam.

## 2011-12-14 ENCOUNTER — Telehealth: Payer: Self-pay

## 2011-12-14 ENCOUNTER — Encounter: Payer: Self-pay | Admitting: Internal Medicine

## 2011-12-14 ENCOUNTER — Ambulatory Visit (INDEPENDENT_AMBULATORY_CARE_PROVIDER_SITE_OTHER)
Admission: RE | Admit: 2011-12-14 | Discharge: 2011-12-14 | Disposition: A | Discharge status: Home / Self Care | Payer: PRIVATE HEALTH INSURANCE | Source: Ambulatory Visit | Attending: Internal Medicine | Admitting: Internal Medicine

## 2011-12-14 ENCOUNTER — Ambulatory Visit (INDEPENDENT_AMBULATORY_CARE_PROVIDER_SITE_OTHER): Payer: PRIVATE HEALTH INSURANCE | Admitting: Internal Medicine

## 2011-12-14 VITALS — BP 122/74 | HR 87 | Temp 98.8°F | Resp 16 | Wt 257.5 lb

## 2011-12-14 DIAGNOSIS — M25519 Pain in unspecified shoulder: Secondary | ICD-10-CM

## 2011-12-14 DIAGNOSIS — E538 Deficiency of other specified B group vitamins: Secondary | ICD-10-CM

## 2011-12-14 DIAGNOSIS — E785 Hyperlipidemia, unspecified: Secondary | ICD-10-CM

## 2011-12-14 DIAGNOSIS — R209 Unspecified disturbances of skin sensation: Secondary | ICD-10-CM

## 2011-12-14 DIAGNOSIS — E039 Hypothyroidism, unspecified: Secondary | ICD-10-CM

## 2011-12-14 DIAGNOSIS — M1710 Unilateral primary osteoarthritis, unspecified knee: Secondary | ICD-10-CM

## 2011-12-14 DIAGNOSIS — M25511 Pain in right shoulder: Secondary | ICD-10-CM

## 2011-12-14 DIAGNOSIS — D649 Anemia, unspecified: Secondary | ICD-10-CM

## 2011-12-14 IMAGING — CR DG SHOULDER 2+V*R*
3 series · 3 of 3 positions shown · non-contrast
Comparison: None.

CLINICAL DATA: Pain

RIGHT SHOULDER - 2+ VIEW

[view not recorded (1 of 3)]
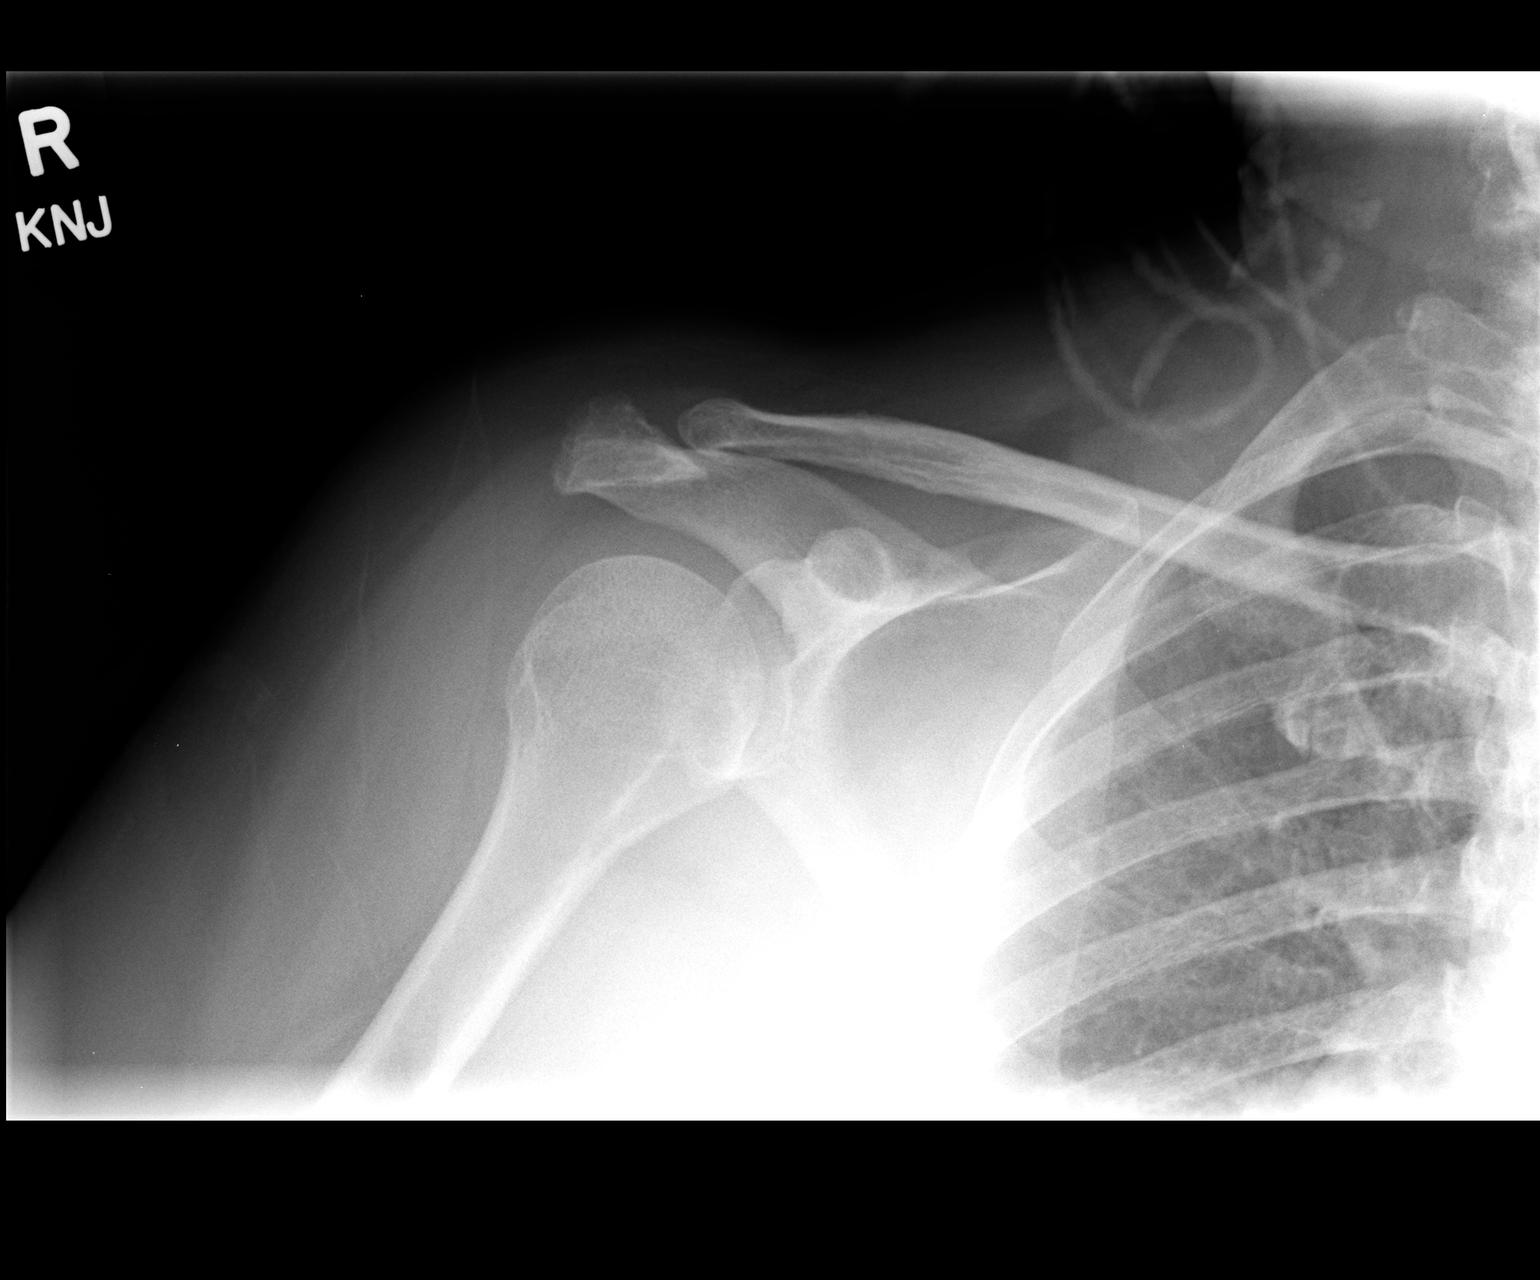

[view not recorded (2 of 3)]
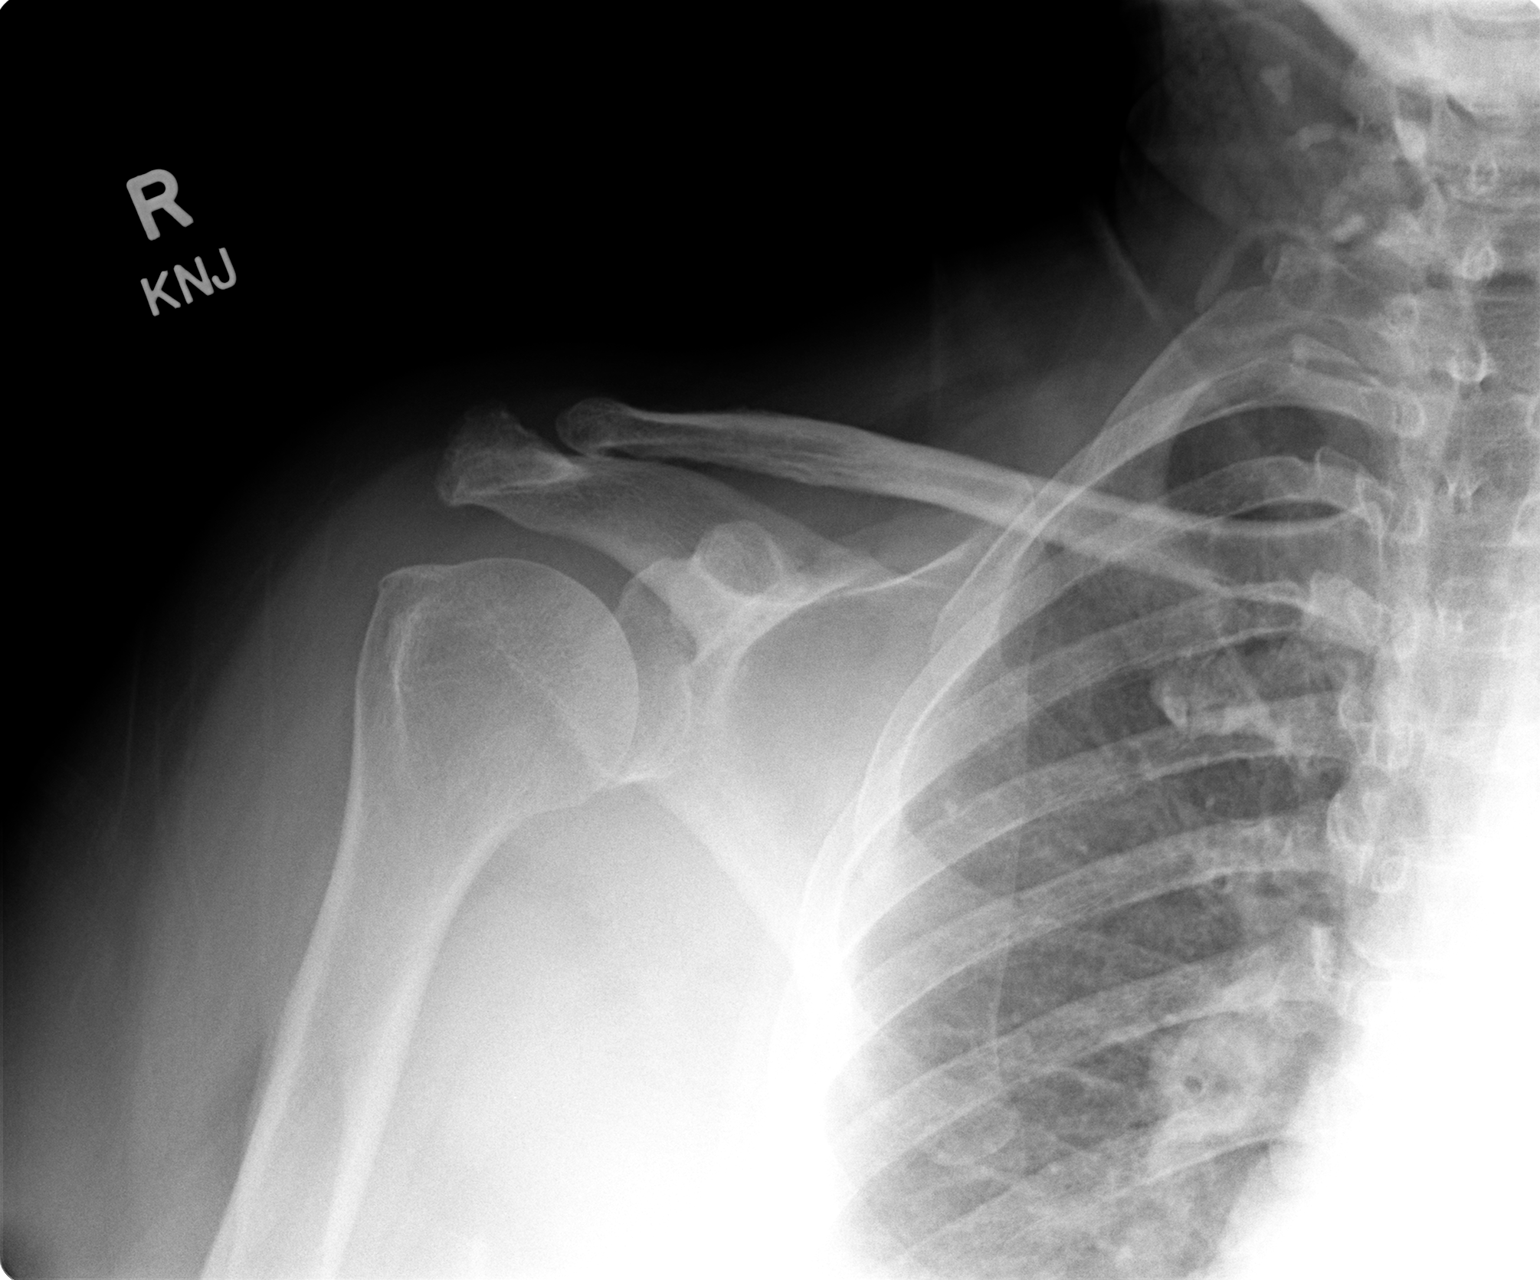

[view not recorded (3 of 3)]
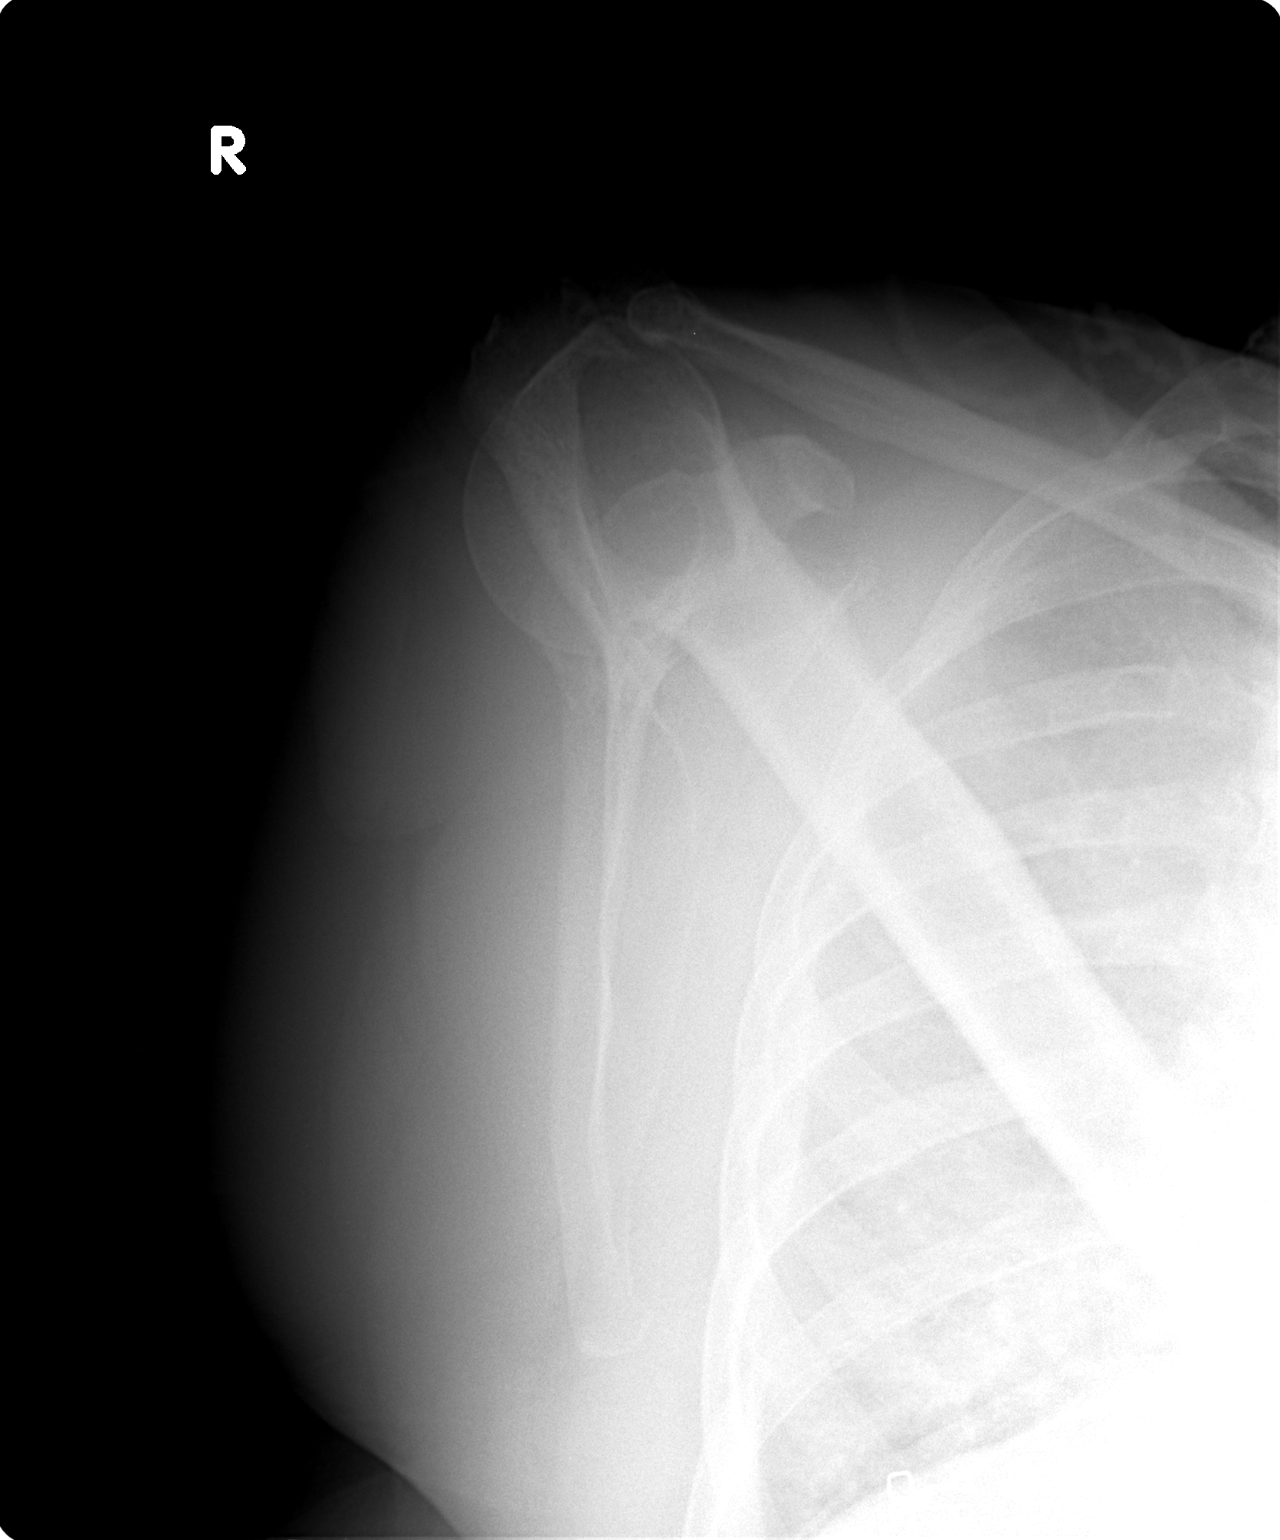

[3 of 3 positions shown; findings below may reference images not displayed]

FINDINGS: The AC joint is intact and the subacromial space is
maintained.  There is no evidence of fracture or dislocation.
There is undersurface spurring at the AC joint which can predispose
to rotator cuff injury.
IMPRESSION: There is no evidence of acute osseous abnormality.

If there is clinical concern regarding a rotator cuff injury, MRI
may be of help.

## 2011-12-14 MED ORDER — BUPRENORPHINE 5 MCG/HR TD PTWK: 5.0000 ug | MEDICATED_PATCH | TRANSDERMAL | Status: DC

## 2011-12-14 MED ORDER — FENTANYL 25 MCG/HR TD PT72: 1.0000 | MEDICATED_PATCH | TRANSDERMAL | Status: AC

## 2011-12-14 MED ORDER — CYANOCOBALAMIN 1000 MCG/ML IJ SOLN
1000.0000 ug | Freq: Once | INTRAMUSCULAR | Status: AC
  Administered 2011-12-14: 1000 ug via INTRAMUSCULAR

## 2011-12-14 NOTE — Assessment & Plan Note (Signed)
She is doing well on crestor so I will check her FLP today

## 2011-12-14 NOTE — Assessment & Plan Note (Signed)
I will check an xray of her right shoulder to look for djd, spurs, etc

## 2011-12-14 NOTE — Assessment & Plan Note (Signed)
I will check labs today and have asked her to get a NCS/EMG done

## 2011-12-14 NOTE — Telephone Encounter (Signed)
Pt called stating that pain patch prescribed at today's OV is not cover under her insurance plan. Pt is requesting medication be changed to a tablet, please advise.

## 2011-12-14 NOTE — Assessment & Plan Note (Signed)
Steroids in her right knee today, try butrans for pain relief, ortho referral check xray of right shoulder

## 2011-12-14 NOTE — Progress Notes (Signed)
Subjective:    Patient ID: Danielle Holt, female    DOB: 1955-07-22, 56 y.o.   MRN: [id number]  Arthritis Presents for follow-up visit. She complains of pain and stiffness. She reports no joint swelling or joint warmth. The symptoms have been improving. Affected locations include the right knee and right shoulder. Her pain is at a severity of 4/10. Associated symptoms include pain while resting. Pertinent negatives include no diarrhea, dry eyes, dry mouth, dysuria, fatigue, fever, pain at night, rash, Raynaud's syndrome, uveitis or weight loss. Compliance with total regimen is 26-50%.      Review of Systems  Constitutional: Negative for fever, chills, weight loss, diaphoresis, activity change, appetite change, fatigue and unexpected weight change.  HENT: Negative.   Eyes: Negative.   Respiratory: Negative for apnea, cough, chest tightness, shortness of breath, wheezing and stridor.   Cardiovascular: Negative for chest pain, palpitations and leg swelling.  Gastrointestinal: Negative for nausea, vomiting, abdominal pain, diarrhea, constipation, blood in stool, abdominal distention and anal bleeding.  Genitourinary: Negative.  Negative for dysuria.  Musculoskeletal: Positive for arthralgias (right knee and right shoulder), arthritis and stiffness. Negative for myalgias, back pain, joint swelling and gait problem.  Skin: Negative for color change, pallor, rash and wound.  Neurological: Positive for numbness (in her left hand and her right foot). Negative for dizziness, tremors, seizures, syncope, facial asymmetry, speech difficulty, weakness, light-headedness and headaches.  Hematological: Negative for adenopathy. Does not bruise/bleed easily.  Psychiatric/Behavioral: Negative.        Objective:   Physical Exam  Vitals reviewed. Constitutional: She is oriented to person, place, and time. She appears well-developed and well-nourished. No distress.  HENT:  Head: Normocephalic and  atraumatic.  Mouth/Throat: Oropharynx is clear and moist. No oropharyngeal exudate.  Eyes: Conjunctivae are normal. Right eye exhibits no discharge. Left eye exhibits no discharge. No scleral icterus.  Neck: Normal range of motion. Neck supple. No JVD present. No tracheal deviation present. No thyromegaly present.  Cardiovascular: Normal rate, regular rhythm, normal heart sounds and intact distal pulses.  Exam reveals no gallop and no friction rub.   No murmur heard. Pulmonary/Chest: Effort normal and breath sounds normal. No stridor. No respiratory distress. She has no wheezes. She has no rales. She exhibits no tenderness.  Abdominal: Soft. Bowel sounds are normal. She exhibits no distension and no mass. There is no tenderness. There is no rebound and no guarding.  Musculoskeletal: She exhibits edema (trace edema in BLE). She exhibits no tenderness.       Right shoulder: She exhibits decreased range of motion and tenderness. She exhibits no bony tenderness, no swelling, no effusion, no crepitus, no deformity, no laceration, no pain, no spasm, normal pulse and normal strength.       Right knee: She exhibits deformity (djd changes). She exhibits normal range of motion, no swelling, no effusion, no ecchymosis, no laceration, no erythema, normal alignment, no LCL laxity, normal patellar mobility and no bony tenderness. no tenderness found.       Right knee was prepped and draped in sterile fashion, local anesthesia was obtained with 1% plain lidocaine, using a 1.5 inch 25 gauge needle the joint space was entered and 1/2 cc of depomedrol (40mg ) and 1/2 cc of plain lidocaine was injected into the joint with no resistance or complications. She tolerated the injection well.  Lymphadenopathy:    She has no cervical adenopathy.  Neurological: She is oriented to person, place, and time.  Skin: Skin is warm and dry.  No rash noted. She is not diaphoretic. No erythema. No pallor.  Psychiatric: She has a normal  mood and affect. Her behavior is normal. Judgment and thought content normal.     Lab Results  Component Value Date   WBC 7.3 08/16/2011   HGB 12.2 08/16/2011   HCT 36.3 08/16/2011   PLT 295.0 08/16/2011   GLUCOSE 104* 07/14/2011   CHOL 240* 07/14/2011   TRIG 150.0* 07/14/2011   HDL 80.30 07/14/2011   LDLDIRECT 128.5 07/14/2011   ALT 16 07/14/2011   AST 17 07/14/2011   NA 140 07/14/2011   K 3.8 07/14/2011   CL 106 07/14/2011   CREATININE 0.7 07/14/2011   BUN 15 07/14/2011   CO2 27 07/14/2011   TSH 2.44 07/14/2011   HGBA1C 6.2 08/16/2011       Assessment & Plan:

## 2011-12-14 NOTE — Assessment & Plan Note (Signed)
I will check her B12 level and I have asked her to get NCS/EMG done to look for neuropathy

## 2011-12-14 NOTE — Telephone Encounter (Signed)
Pt informed, Rx in cabinet for pt pick up  

## 2011-12-14 NOTE — Assessment & Plan Note (Signed)
I will check her TSH today 

## 2011-12-14 NOTE — Assessment & Plan Note (Signed)
I will check her a1c and will monitor her renal function 

## 2011-12-14 NOTE — Telephone Encounter (Signed)
New Rx is on Danielle Holt's desk, she needs to come pick it up

## 2011-12-14 NOTE — Patient Instructions (Signed)
Diabetes, Type 2 Diabetes is a long-lasting (chronic) disease. In type 2 diabetes, the pancreas does not make enough insulin (a hormone), and the body does not respond normally to the insulin that is made. This type of diabetes was also previously called adult-onset diabetes. It usually occurs after the age of 50, but it can occur at any age.  CAUSES  Type 2 diabetes happens because the pancreasis not making enough insulin or your body has trouble using the insulin that your pancreas does make properly. SYMPTOMS   Drinking more than usual.   Urinating more than usual.   Blurred vision.   Dry, itchy skin.   Frequent infections.   Feeling more tired than usual (fatigue).  DIAGNOSIS The diagnosis of type 2 diabetes is usually made by one of the following tests:  Fasting blood glucose test. You will not eat for at least 8 hours and then take a blood test.   Random blood glucose test. Your blood glucose (sugar) is checked at any time of the day regardless of when you ate.   Oral glucose tolerance test (OGTT). Your blood glucose is measured after you have not eaten (fasted) and then after you drink a glucose containing beverage.  TREATMENT   Healthy eating.   Exercise.   Medicine, if needed.   Monitoring blood glucose.   Seeing your caregiver regularly.  HOME CARE INSTRUCTIONS   Check your blood glucose at least once a day. More frequent monitoring may be necessary, depending on your medicines and on how well your diabetes is controlled. Your caregiver will advise you.   Take your medicine as directed by your caregiver.   Do not smoke.   Make wise food choices. Ask your caregiver for information. Weight loss can improve your diabetes.   Learn about low blood glucose (hypoglycemia) and how to treat it.   Get your eyes checked regularly.   Have a yearly physical exam. Have your blood pressure checked and your blood and urine tested.   Wear a pendant or bracelet saying  that you have diabetes.   Check your feet every night for cuts, sores, blisters, and redness. Let your caregiver know if you have any problems.  SEEK MEDICAL CARE IF:   You have problems keeping your blood glucose in target range.   You have problems with your medicines.   You have symptoms of an illness that do not improve after 24 hours.   You have a sore or wound that is not healing.   You notice a change in vision or a new problem with your vision.   You have a fever.  MAKE SURE YOU:  Understand these instructions.   Will watch your condition.   Will get help right away if you are not doing well or get worse.  Document Released: 06/26/2005 Document Revised: 06/15/2011 Document Reviewed: 12/12/2010 [facility] Patient Information 2012 [place], [place].Degenerative Arthritis You have osteoarthritis. This is the wear and tear arthritis that comes with aging. It is also called degenerative arthritis. This is common in people past middle age. It is caused by stress on the joints. The large weight bearing joints of the lower extremities are most often affected. The knees, hips, back, neck, and hands can become painful, swollen, and stiff. This is the most common type of arthritis. It comes on with age, carrying too much weight, or from an injury. Treatment includes resting the sore joint until the pain and swelling improve. Crutches or a walker may be needed for  severe flares. Only take over-the-counter or prescription medicines for pain, discomfort, or fever as directed by your caregiver. Local heat therapy may improve motion. Cortisone shots into the joint are sometimes used to reduce pain and swelling during flares. Osteoarthritis is usually not crippling and progresses slowly. There are things you can do to decrease pain:  Avoid high impact activities.   Exercise regularly.   Low impact exercises such as walking, biking and swimming help to keep the muscles strong and keep normal joint  function.   Stretching helps to keep your range of motion.   Lose weight if you are overweight. This reduces joint stress.  In severe cases when you have pain at rest or increasing disability, joint surgery may be helpful. See your caregiver for follow-up treatment as recommended.  SEEK IMMEDIATE MEDICAL CARE IF:   You have severe joint pain.   Marked swelling and redness in your joint develops.   You develop a high fever.  Document Released: 06/26/2005 Document Revised: 06/15/2011 Document Reviewed: 11/26/2006 [facility] Patient Information 2012 [place], [place].

## 2012-01-23 ENCOUNTER — Ambulatory Visit (INDEPENDENT_AMBULATORY_CARE_PROVIDER_SITE_OTHER): Payer: PRIVATE HEALTH INSURANCE

## 2012-01-23 ENCOUNTER — Other Ambulatory Visit: Payer: Self-pay | Admitting: Internal Medicine

## 2012-01-23 DIAGNOSIS — E538 Deficiency of other specified B group vitamins: Secondary | ICD-10-CM

## 2012-01-23 DIAGNOSIS — M1710 Unilateral primary osteoarthritis, unspecified knee: Secondary | ICD-10-CM

## 2012-01-23 MED ORDER — OXYCODONE-ACETAMINOPHEN 5-325 MG PO TABS: 1.0000 | ORAL_TABLET | ORAL | Status: DC | PRN

## 2012-01-23 MED ORDER — CYANOCOBALAMIN 1000 MCG/ML IJ SOLN
1000.0000 ug | Freq: Once | INTRAMUSCULAR | Status: AC
  Administered 2012-01-23: 1000 ug via INTRAMUSCULAR

## 2012-02-09 ENCOUNTER — Other Ambulatory Visit: Payer: Self-pay | Admitting: *Deleted

## 2012-02-09 MED ORDER — ALBUTEROL SULFATE HFA 108 (90 BASE) MCG/ACT IN AERS: 2.0000 | INHALATION_SPRAY | Freq: Four times a day (QID) | RESPIRATORY_TRACT | Status: DC | PRN

## 2012-02-12 ENCOUNTER — Ambulatory Visit (INDEPENDENT_AMBULATORY_CARE_PROVIDER_SITE_OTHER): Payer: PRIVATE HEALTH INSURANCE

## 2012-02-12 DIAGNOSIS — E538 Deficiency of other specified B group vitamins: Secondary | ICD-10-CM

## 2012-02-12 MED ORDER — CYANOCOBALAMIN 1000 MCG/ML IJ SOLN
1000.0000 ug | Freq: Once | INTRAMUSCULAR | Status: AC
  Administered 2012-02-12: 1000 ug via INTRAMUSCULAR

## 2012-02-23 ENCOUNTER — Encounter: Payer: Self-pay | Admitting: Internal Medicine

## 2012-02-23 ENCOUNTER — Other Ambulatory Visit (INDEPENDENT_AMBULATORY_CARE_PROVIDER_SITE_OTHER): Payer: PRIVATE HEALTH INSURANCE

## 2012-02-23 ENCOUNTER — Ambulatory Visit (INDEPENDENT_AMBULATORY_CARE_PROVIDER_SITE_OTHER): Payer: PRIVATE HEALTH INSURANCE | Admitting: Internal Medicine

## 2012-02-23 VITALS — BP 140/90 | HR 93 | Temp 98.7°F | Resp 16 | Wt 243.0 lb

## 2012-02-23 DIAGNOSIS — D649 Anemia, unspecified: Secondary | ICD-10-CM

## 2012-02-23 DIAGNOSIS — E538 Deficiency of other specified B group vitamins: Secondary | ICD-10-CM

## 2012-02-23 DIAGNOSIS — E039 Hypothyroidism, unspecified: Secondary | ICD-10-CM

## 2012-02-23 DIAGNOSIS — M1710 Unilateral primary osteoarthritis, unspecified knee: Secondary | ICD-10-CM

## 2012-02-23 DIAGNOSIS — F329 Major depressive disorder, single episode, unspecified: Secondary | ICD-10-CM

## 2012-02-23 DIAGNOSIS — R209 Unspecified disturbances of skin sensation: Secondary | ICD-10-CM

## 2012-02-23 DIAGNOSIS — I10 Essential (primary) hypertension: Secondary | ICD-10-CM

## 2012-02-23 DIAGNOSIS — J449 Chronic obstructive pulmonary disease, unspecified: Secondary | ICD-10-CM

## 2012-02-23 DIAGNOSIS — E785 Hyperlipidemia, unspecified: Secondary | ICD-10-CM

## 2012-02-23 DIAGNOSIS — M129 Arthropathy, unspecified: Secondary | ICD-10-CM

## 2012-02-23 LAB — CBC WITH DIFFERENTIAL/PLATELET
Basophils Absolute: 0 10*3/uL (ref 0.0–0.1)
Basophils Relative: 0.6 % (ref 0.0–3.0)
Eosinophils Absolute: 0.1 10*3/uL (ref 0.0–0.7)
HCT: 37.1 % (ref 36.0–46.0)
Hemoglobin: 12 g/dL (ref 12.0–15.0)
Lymphs Abs: 2.9 10*3/uL (ref 0.7–4.0)
MCHC: 32.3 g/dL (ref 30.0–36.0)
Neutro Abs: 4.2 10*3/uL (ref 1.4–7.7)
RBC: 4.29 Mil/uL (ref 3.87–5.11)
RDW: 14.8 % — ABNORMAL HIGH (ref 11.5–14.6)

## 2012-02-23 LAB — COMPREHENSIVE METABOLIC PANEL
ALT: 18 U/L (ref 0–35)
AST: 28 U/L (ref 0–37)
Alkaline Phosphatase: 95 U/L (ref 39–117)
Creatinine, Ser: 0.8 mg/dL (ref 0.4–1.2)
Total Bilirubin: 0.1 mg/dL — ABNORMAL LOW (ref 0.3–1.2)

## 2012-02-23 LAB — LIPID PANEL
Cholesterol: 133 mg/dL (ref 0–200)
HDL: 71.6 mg/dL (ref >39.00)

## 2012-02-23 LAB — HEMOGLOBIN A1C: Hgb A1c MFr Bld: 5.9 % (ref 4.6–6.5)

## 2012-02-23 MED ORDER — OLMESARTAN MEDOXOMIL 20 MG PO TABS: 20.0000 mg | ORAL_TABLET | Freq: Every day | ORAL | Status: DC

## 2012-02-23 MED ORDER — CYANOCOBALAMIN 1000 MCG/ML IJ SOLN: 1000.0000 ug | INTRAMUSCULAR | Status: DC

## 2012-02-23 MED ORDER — OXYCODONE-ACETAMINOPHEN 5-325 MG PO TABS: 1.0000 | ORAL_TABLET | ORAL | Status: DC | PRN

## 2012-02-23 MED ORDER — HYDROCODONE-IBUPROFEN 7.5-200 MG PO TABS: 1.0000 | ORAL_TABLET | Freq: Three times a day (TID) | ORAL | Status: DC | PRN

## 2012-02-23 NOTE — Patient Instructions (Signed)
Diabetes, Type 2 Diabetes is a long-lasting (chronic) disease. In type 2 diabetes, the pancreas does not make enough insulin (a hormone), and the body does not respond normally to the insulin that is made. This type of diabetes was also previously called adult-onset diabetes. It usually occurs after the age of 40, but it can occur at any age.  CAUSES  Type 2 diabetes happens because the pancreasis not making enough insulin or your body has trouble using the insulin that your pancreas does make properly. SYMPTOMS   Drinking more than usual.   Urinating more than usual.   Blurred vision.   Dry, itchy skin.   Frequent infections.   Feeling more tired than usual (fatigue).  DIAGNOSIS The diagnosis of type 2 diabetes is usually made by one of the following tests:  Fasting blood glucose test. You will not eat for at least 8 hours and then take a blood test.   Random blood glucose test. Your blood glucose (sugar) is checked at any time of the day regardless of when you ate.   Oral glucose tolerance test (OGTT). Your blood glucose is measured after you have not eaten (fasted) and then after you drink a glucose containing beverage.  TREATMENT   Healthy eating.   Exercise.   Medicine, if needed.   Monitoring blood glucose.   Seeing your caregiver regularly.  HOME CARE INSTRUCTIONS   Check your blood glucose at least once a day. More frequent monitoring may be necessary, depending on your medicines and on how well your diabetes is controlled. Your caregiver will advise you.   Take your medicine as directed by your caregiver.   Do not smoke.   Make wise food choices. Ask your caregiver for information. Weight loss can improve your diabetes.   Learn about low blood glucose (hypoglycemia) and how to treat it.   Get your eyes checked regularly.   Have a yearly physical exam. Have your blood pressure checked and your blood and urine tested.   Wear a pendant or bracelet saying  that you have diabetes.   Check your feet every night for cuts, sores, blisters, and redness. Let your caregiver know if you have any problems.  SEEK MEDICAL CARE IF:   You have problems keeping your blood glucose in target range.   You have problems with your medicines.   You have symptoms of an illness that do not improve after 24 hours.   You have a sore or wound that is not healing.   You notice a change in vision or a new problem with your vision.   You have a fever.  MAKE SURE YOU:  Understand these instructions.   Will watch your condition.   Will get help right away if you are not doing well or get worse.  Document Released: 06/26/2005 Document Revised: 06/15/2011 Document Reviewed: 12/12/2010 [employer] Patient Information 2012 [employer].Hypertension As your heart beats, it forces blood through your arteries. This force is your blood pressure. If the pressure is too high, it is called hypertension (HTN) or high blood pressure. HTN is dangerous because you may have it and not know it. High blood pressure may mean that your heart has to work harder to pump blood. Your arteries may be narrow or stiff. The extra work puts you at risk for heart disease, stroke, and other problems.  Blood pressure consists of two numbers, a higher number over a lower, 110/72, for example. It is stated as "110 over 72." The ideal   is below 120 for the top number (systolic) and under 80 for the bottom (diastolic). Write down your blood pressure today. You should pay close attention to your blood pressure if you have certain conditions such as:  Heart failure.   Prior heart attack.   Diabetes   Chronic kidney disease.   Prior stroke.   Multiple risk factors for heart disease.  To see if you have HTN, your blood pressure should be measured while you are seated with your arm held at the level of the heart. It should be measured at least twice. A one-time elevated blood pressure reading  (especially in the Emergency Department) does not mean that you need treatment. There may be conditions in which the blood pressure is different between your right and left arms. It is important to see your caregiver soon for a recheck. Most people have essential hypertension which means that there is not a specific cause. This type of high blood pressure may be lowered by changing lifestyle factors such as:  Stress.   Smoking.   Lack of exercise.   Excessive weight.   Drug/tobacco/alcohol use.   Eating less salt.  Most people do not have symptoms from high blood pressure until it has caused damage to the body. Effective treatment can often prevent, delay or reduce that damage. TREATMENT  When a cause has been identified, treatment for high blood pressure is directed at the cause. There are a large number of medications to treat HTN. These fall into several categories, and your caregiver will help you select the medicines that are best for you. Medications may have side effects. You should review side effects with your caregiver. If your blood pressure stays high after you have made lifestyle changes or started on medicines,   Your medication(s) may need to be changed.   Other problems may need to be addressed.   Be certain you understand your prescriptions, and know how and when to take your medicine.   Be sure to follow up with your caregiver within the time frame advised (usually within two weeks) to have your blood pressure rechecked and to review your medications.   If you are taking more than one medicine to lower your blood pressure, make sure you know how and at what times they should be taken. Taking two medicines at the same time can result in blood pressure that is too low.  SEEK IMMEDIATE MEDICAL CARE IF:  You develop a severe headache, blurred or changing vision, or confusion.   You have unusual weakness or numbness, or a faint feeling.   You have severe chest or  abdominal pain, vomiting, or breathing problems.  MAKE SURE YOU:   Understand these instructions.   Will watch your condition.   Will get help right away if you are not doing well or get worse.  Document Released: 06/26/2005 Document Revised: 06/15/2011 Document Reviewed: 02/14/2008 [employer] Patient Information 2012 [employer]. 

## 2012-02-23 NOTE — Assessment & Plan Note (Signed)
I will check her TSH level today 

## 2012-02-23 NOTE — Assessment & Plan Note (Signed)
Recheck her FLP today 

## 2012-02-23 NOTE — Assessment & Plan Note (Signed)
I will check her B12 level today 

## 2012-02-23 NOTE — Assessment & Plan Note (Signed)
Continue current meds for pain 

## 2012-02-23 NOTE — Assessment & Plan Note (Signed)
She is doing really well on her current regimen

## 2012-02-23 NOTE — Progress Notes (Signed)
Subjective:    Patient ID: Danielle Holt, female    DOB: 02/16/1956, 56 y.o.   MRN: [id number]  Hypertension This is a new problem. The problem has been gradually worsening since onset. The problem is uncontrolled. Associated symptoms include anxiety. Pertinent negatives include no blurred vision, chest pain, headaches, malaise/fatigue, neck pain, orthopnea, palpitations, peripheral edema, PND, shortness of breath or sweats. Past treatments include nothing. Compliance problems include exercise and diet.       Review of Systems  Constitutional: Negative for fever, chills, malaise/fatigue, diaphoresis, activity change, appetite change, fatigue and unexpected weight change.  HENT: Negative.  Negative for neck pain.   Eyes: Negative.  Negative for blurred vision.  Respiratory: Negative for cough, choking, chest tightness, shortness of breath, wheezing and stridor.   Cardiovascular: Negative for chest pain, palpitations, orthopnea, leg swelling and PND.  Gastrointestinal: Negative for nausea, vomiting, abdominal pain, diarrhea, constipation and anal bleeding.  Genitourinary: Negative.   Musculoskeletal: Positive for arthralgias (knee and shoulder pain). Negative for myalgias, back pain, joint swelling and gait problem.  Skin: Negative for color change, pallor, rash and wound.  Neurological: Positive for numbness (in left 5th finger and side of right foot). Negative for dizziness, tremors, seizures, syncope, facial asymmetry, speech difficulty, weakness and headaches.  Hematological: Negative for adenopathy. Does not bruise/bleed easily.  Psychiatric/Behavioral: Negative for suicidal ideas, hallucinations, behavioral problems, confusion, disturbed wake/sleep cycle, self-injury, dysphoric mood, decreased concentration and agitation. The patient is nervous/anxious. The patient is not hyperactive.        Objective:   Physical Exam  Vitals reviewed. Constitutional: She is oriented to person,  place, and time. She appears well-developed and well-nourished. No distress.  HENT:  Head: Normocephalic and atraumatic.  Mouth/Throat: Oropharynx is clear and moist. No oropharyngeal exudate.  Eyes: Conjunctivae are normal. Right eye exhibits no discharge. Left eye exhibits no discharge. No scleral icterus.  Neck: Normal range of motion. Neck supple. No JVD present. No tracheal deviation present. No thyromegaly present.  Cardiovascular: Normal rate, regular rhythm, normal heart sounds and intact distal pulses.  Exam reveals no gallop and no friction rub.   No murmur heard. Pulmonary/Chest: Effort normal and breath sounds normal. No stridor. No respiratory distress. She has no wheezes. She has no rales. She exhibits no tenderness.  Abdominal: Soft. Bowel sounds are normal. She exhibits no distension and no mass. There is no tenderness. There is no rebound and no guarding.  Musculoskeletal: Normal range of motion. She exhibits no edema and no tenderness.  Lymphadenopathy:    She has no cervical adenopathy.  Neurological: She is alert and oriented to person, place, and time. She has normal reflexes. She displays normal reflexes. No cranial nerve deficit. She exhibits normal muscle tone. Coordination normal.  Skin: Skin is warm and dry. No rash noted. She is not diaphoretic. No erythema. No pallor.  Psychiatric: She has a normal mood and affect. Her behavior is normal. Judgment and thought content normal.      Lab Results  Component Value Date   WBC 7.3 08/16/2011   HGB 12.2 08/16/2011   HCT 36.3 08/16/2011   PLT 295.0 08/16/2011   GLUCOSE 104* 07/14/2011   CHOL 240* 07/14/2011   TRIG 150.0* 07/14/2011   HDL 80.30 07/14/2011   LDLDIRECT 128.5 07/14/2011   ALT 16 07/14/2011   AST 17 07/14/2011   NA 140 07/14/2011   K 3.8 07/14/2011   CL 106 07/14/2011   CREATININE 0.7 07/14/2011   BUN 15 07/14/2011  CO2 27 07/14/2011   TSH 2.44 07/14/2011   HGBA1C 6.2 08/16/2011      Assessment & Plan:

## 2012-02-23 NOTE — Assessment & Plan Note (Signed)
Start Benicar 

## 2012-02-23 NOTE — Assessment & Plan Note (Signed)
I will recheck her CBC today 

## 2012-02-23 NOTE — Assessment & Plan Note (Signed)
This may be sequelae of B12 defic and DM but the NCS was normal. No further evaluation or treatment needed at this time.

## 2012-02-25 ENCOUNTER — Other Ambulatory Visit: Payer: Self-pay | Admitting: Internal Medicine

## 2012-02-26 ENCOUNTER — Encounter: Payer: Self-pay | Admitting: Internal Medicine

## 2012-02-26 LAB — TSH: TSH: 2.02 u[IU]/mL (ref 0.35–5.50)

## 2012-02-26 LAB — FOLATE: Folate: 8.1 ng/mL (ref >5.9)

## 2012-03-13 ENCOUNTER — Ambulatory Visit (INDEPENDENT_AMBULATORY_CARE_PROVIDER_SITE_OTHER): Payer: PRIVATE HEALTH INSURANCE | Admitting: Internal Medicine

## 2012-03-13 ENCOUNTER — Encounter: Payer: Self-pay | Admitting: Internal Medicine

## 2012-03-13 ENCOUNTER — Other Ambulatory Visit (INDEPENDENT_AMBULATORY_CARE_PROVIDER_SITE_OTHER): Payer: PRIVATE HEALTH INSURANCE

## 2012-03-13 VITALS — BP 140/86 | HR 93 | Temp 100.0°F | Resp 16 | Wt 235.5 lb

## 2012-03-13 DIAGNOSIS — R197 Diarrhea, unspecified: Secondary | ICD-10-CM

## 2012-03-13 DIAGNOSIS — M1710 Unilateral primary osteoarthritis, unspecified knee: Secondary | ICD-10-CM

## 2012-03-13 DIAGNOSIS — I10 Essential (primary) hypertension: Secondary | ICD-10-CM

## 2012-03-13 DIAGNOSIS — E785 Hyperlipidemia, unspecified: Secondary | ICD-10-CM

## 2012-03-13 DIAGNOSIS — M25569 Pain in unspecified knee: Secondary | ICD-10-CM

## 2012-03-13 DIAGNOSIS — E876 Hypokalemia: Secondary | ICD-10-CM

## 2012-03-13 DIAGNOSIS — M25561 Pain in right knee: Secondary | ICD-10-CM

## 2012-03-13 DIAGNOSIS — F329 Major depressive disorder, single episode, unspecified: Secondary | ICD-10-CM

## 2012-03-13 DIAGNOSIS — K529 Noninfective gastroenteritis and colitis, unspecified: Secondary | ICD-10-CM

## 2012-03-13 LAB — BASIC METABOLIC PANEL
BUN: 8 mg/dL (ref 6–23)
CO2: 27 mEq/L (ref 19–32)
Calcium: 9.1 mg/dL (ref 8.4–10.5)
Chloride: 105 mEq/L (ref 96–112)
Creatinine, Ser: 1.1 mg/dL (ref 0.4–1.2)

## 2012-03-13 LAB — MAGNESIUM: Magnesium: 2 mg/dL (ref 1.5–2.5)

## 2012-03-13 MED ORDER — LORAZEPAM 1 MG PO TABS: 1.0000 mg | ORAL_TABLET | Freq: Three times a day (TID) | ORAL | Status: DC | PRN

## 2012-03-13 MED ORDER — OXYCODONE-ACETAMINOPHEN 5-325 MG PO TABS: 1.0000 | ORAL_TABLET | ORAL | Status: DC | PRN

## 2012-03-13 MED ORDER — POTASSIUM CHLORIDE CRYS ER 20 MEQ PO TBCR: 20.0000 meq | EXTENDED_RELEASE_TABLET | Freq: Three times a day (TID) | ORAL | Status: DC

## 2012-03-13 MED ORDER — BUPROPION HCL 75 MG PO TABS: 75.0000 mg | ORAL_TABLET | Freq: Two times a day (BID) | ORAL | Status: DC

## 2012-03-13 NOTE — Assessment & Plan Note (Addendum)
I will check her BMP and Mg++ level today  Late note: K+ level is still low so will start K= replacement therapy

## 2012-03-13 NOTE — Patient Instructions (Signed)

## 2012-03-13 NOTE — Assessment & Plan Note (Signed)
She will continue taking nsaids and I will upgrade her pain med to percocet and have asked her to see ortho

## 2012-03-13 NOTE — Progress Notes (Signed)
Subjective:    Patient ID: Danielle Holt, female    DOB: 1955-07-28, 56 y.o.   MRN: [id number]  Arthritis Presents for follow-up visit. She complains of pain. She reports no stiffness, joint swelling or joint warmth. The symptoms have been worsening. Affected locations include the left knee and right knee. Her pain is at a severity of 4/10. Associated symptoms include pain at night and pain while resting. Pertinent negatives include no diarrhea, dry eyes, dry mouth, dysuria, fatigue, fever, rash, Raynaud's syndrome, uveitis or weight loss. Compliance with total regimen is 76-100%.      Review of Systems  Constitutional: Negative for fever, chills, weight loss, diaphoresis, activity change, appetite change, fatigue and unexpected weight change.  HENT: Negative.   Eyes: Negative.   Respiratory: Negative for cough, chest tightness, shortness of breath, wheezing and stridor.   Cardiovascular: Negative for chest pain, palpitations and leg swelling.  Gastrointestinal: Negative for nausea, vomiting, abdominal pain, diarrhea, constipation and blood in stool.  Genitourinary: Negative.  Negative for dysuria.  Musculoskeletal: Positive for myalgias, arthralgias and arthritis. Negative for back pain, joint swelling, gait problem and stiffness.  Skin: Negative for color change, pallor, rash and wound.  Neurological: Negative.   Hematological: Does not bruise/bleed easily.  Psychiatric/Behavioral: Positive for disturbed wake/sleep cycle. Negative for suicidal ideas, hallucinations, behavioral problems, confusion, self-injury, dysphoric mood, decreased concentration and agitation. The patient is nervous/anxious. The patient is not hyperactive.        Objective:   Physical Exam  Vitals reviewed. Constitutional: She is oriented to person, place, and time. She appears well-developed and well-nourished. No distress.  HENT:  Head: Normocephalic and atraumatic.  Mouth/Throat: Oropharynx is clear and  moist. No oropharyngeal exudate.  Eyes: Conjunctivae are normal. Right eye exhibits no discharge. Left eye exhibits no discharge. No scleral icterus.  Neck: Normal range of motion. Neck supple. No JVD present. No tracheal deviation present. No thyromegaly present.  Cardiovascular: Normal rate, regular rhythm, normal heart sounds and intact distal pulses.  Exam reveals no gallop and no friction rub.   No murmur heard. Pulmonary/Chest: Effort normal and breath sounds normal. No stridor. No respiratory distress. She has no wheezes. She has no rales. She exhibits no tenderness.  Abdominal: Soft. Bowel sounds are normal. She exhibits no distension and no mass. There is no tenderness. There is no rebound and no guarding.  Musculoskeletal: Normal range of motion. She exhibits no edema and no tenderness.       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 She exhibits normal range of motion, no swelling, no effusion, no ecchymosis, no laceration, no erythema, normal alignment, no LCL laxity and normal patellar mobility. no tenderness found.       Left knee: She exhibits deformity (DJD). She exhibits normal range of motion, no swelling, no effusion, no ecchymosis, no laceration, no erythema, normal alignment, no LCL laxity and normal patellar mobility. no tenderness found.  Lymphadenopathy:    She has no cervical adenopathy.  Neurological: She is oriented to person, place, and time.  Skin: Skin is warm and dry. No rash noted. She is not diaphoretic. No erythema. No pallor.  Psychiatric: She has a normal mood and affect. Her behavior is normal. Judgment and thought content normal.     Lab Results  Component Value Date   WBC 7.8 02/23/2012   HGB 12.0 02/23/2012   HCT 37.1 02/23/2012   PLT 239.0 02/23/2012   GLUCOSE 87 02/23/2012   CHOL 133 02/23/2012   TRIG 79.0 02/23/2012  HDL 71.60 02/23/2012   LDLDIRECT 128.5 07/14/2011   LDLCALC 46 02/23/2012   ALT 18 02/23/2012   AST 28 02/23/2012   NA 143 02/23/2012   K  3.3* 02/23/2012   CL 109 02/23/2012   CREATININE 0.8 02/23/2012   BUN 13 02/23/2012   CO2 23 02/23/2012   TSH 2.02 02/23/2012   HGBA1C 5.9 02/23/2012       Assessment & Plan:

## 2012-03-13 NOTE — Assessment & Plan Note (Signed)
Due to the muscle aches she has stopped crestor and feels some better, I will recheck her CPK level today

## 2012-03-13 NOTE — Addendum Note (Signed)
Addended by: Etta Grandchild on: 03/13/2012 11:49 AM   Modules accepted: Orders

## 2012-03-13 NOTE — Assessment & Plan Note (Addendum)
She is doing well on Wellbutrin, Viibryd and ativan prn

## 2012-03-19 ENCOUNTER — Ambulatory Visit: Payer: PRIVATE HEALTH INSURANCE | Admitting: Internal Medicine

## 2012-04-12 ENCOUNTER — Telehealth: Payer: Self-pay

## 2012-04-12 DIAGNOSIS — M1710 Unilateral primary osteoarthritis, unspecified knee: Secondary | ICD-10-CM

## 2012-04-12 MED ORDER — OXYCODONE-ACETAMINOPHEN 5-325 MG PO TABS: 1.0000 | ORAL_TABLET | ORAL | Status: AC | PRN

## 2012-04-12 NOTE — Telephone Encounter (Signed)
done

## 2012-04-12 NOTE — Telephone Encounter (Signed)
Patient called stating time to pick up monthly rx for oxy. Pt request to pick up this afternoon

## 2012-05-14 ENCOUNTER — Ambulatory Visit (INDEPENDENT_AMBULATORY_CARE_PROVIDER_SITE_OTHER)
Admission: RE | Admit: 2012-05-14 | Discharge: 2012-05-14 | Disposition: A | Discharge status: Home / Self Care | Payer: PRIVATE HEALTH INSURANCE | Source: Ambulatory Visit | Attending: Internal Medicine | Admitting: Internal Medicine

## 2012-05-14 ENCOUNTER — Other Ambulatory Visit (INDEPENDENT_AMBULATORY_CARE_PROVIDER_SITE_OTHER): Payer: PRIVATE HEALTH INSURANCE

## 2012-05-14 ENCOUNTER — Encounter: Payer: Self-pay | Admitting: Internal Medicine

## 2012-05-14 ENCOUNTER — Ambulatory Visit (INDEPENDENT_AMBULATORY_CARE_PROVIDER_SITE_OTHER): Payer: PRIVATE HEALTH INSURANCE | Admitting: Internal Medicine

## 2012-05-14 VITALS — BP 120/78 | HR 78 | Temp 98.6°F | Resp 16 | Wt 229.8 lb

## 2012-05-14 DIAGNOSIS — M79671 Pain in right foot: Secondary | ICD-10-CM

## 2012-05-14 DIAGNOSIS — E876 Hypokalemia: Secondary | ICD-10-CM

## 2012-05-14 DIAGNOSIS — IMO0002 Reserved for concepts with insufficient information to code with codable children: Secondary | ICD-10-CM

## 2012-05-14 DIAGNOSIS — Z23 Encounter for immunization: Secondary | ICD-10-CM | POA: Insufficient documentation

## 2012-05-14 DIAGNOSIS — M1710 Unilateral primary osteoarthritis, unspecified knee: Secondary | ICD-10-CM

## 2012-05-14 DIAGNOSIS — E538 Deficiency of other specified B group vitamins: Secondary | ICD-10-CM

## 2012-05-14 DIAGNOSIS — M79609 Pain in unspecified limb: Secondary | ICD-10-CM

## 2012-05-14 DIAGNOSIS — I10 Essential (primary) hypertension: Secondary | ICD-10-CM

## 2012-05-14 LAB — BASIC METABOLIC PANEL
BUN: 12 mg/dL (ref 6–23)
CO2: 24 mEq/L (ref 19–32)
Calcium: 9 mg/dL (ref 8.4–10.5)
Creatinine, Ser: 1 mg/dL (ref 0.4–1.2)
Glucose, Bld: 96 mg/dL (ref 70–99)

## 2012-05-14 LAB — MAGNESIUM: Magnesium: 2 mg/dL (ref 1.5–2.5)

## 2012-05-14 MED ORDER — TRAMADOL HCL 50 MG PO TABS: 50.0000 mg | ORAL_TABLET | Freq: Three times a day (TID) | ORAL | Status: DC | PRN

## 2012-05-14 MED ORDER — CYANOCOBALAMIN 1000 MCG/ML IJ SOLN
1000.0000 ug | INTRAMUSCULAR | Status: DC
  Administered 2012-05-14: 1000 ug via INTRAMUSCULAR

## 2012-05-14 NOTE — Progress Notes (Signed)
Subjective:    Patient ID: Danielle Holt, female    DOB: 02/20/1956, 56 y.o.   MRN: [id number]  Arthritis Presents for follow-up visit. She complains of pain. She reports no stiffness, joint swelling or joint warmth. Affected locations include the left knee, right knee and right foot. Her pain is at a severity of 2/10. Associated symptoms include pain at night and pain while resting. Pertinent negatives include no diarrhea, dry eyes, dry mouth, dysuria, fatigue, fever, rash, Raynaud's syndrome, uveitis or weight loss. Compliance with total regimen is 26-50%. Compliance problems include psychosocial issues.  Compliance with medications is 26-50%.      Review of Systems  Constitutional: Negative for fever, chills, weight loss, diaphoresis, appetite change, fatigue and unexpected weight change.  HENT: Negative.   Eyes: Negative.   Respiratory: Negative for cough, chest tightness, shortness of breath, wheezing and stridor.   Cardiovascular: Negative for chest pain, palpitations and leg swelling.  Gastrointestinal: Negative for nausea, vomiting, abdominal pain and diarrhea.  Genitourinary: Negative.  Negative for dysuria.  Musculoskeletal: Positive for arthralgias and arthritis. Negative for myalgias, back pain, joint swelling, gait problem and stiffness.  Skin: Negative for color change, pallor, rash and wound.  Neurological: Negative.   Hematological: Negative for adenopathy. Does not bruise/bleed easily.       Objective:   Physical Exam  Vitals reviewed. Constitutional: She is oriented to person, place, and time. She appears well-developed and well-nourished. No distress.  HENT:  Head: Normocephalic and atraumatic.  Mouth/Throat: No oropharyngeal exudate.  Eyes: Conjunctivae normal are normal. Right eye exhibits no discharge. Left eye exhibits no discharge. No scleral icterus.  Neck: Normal range of motion. Neck supple. No JVD present. No tracheal deviation present. No thyromegaly  present.  Cardiovascular: Normal rate, regular rhythm, normal heart sounds and intact distal pulses.  Exam reveals no gallop and no friction rub.   No murmur heard. Pulses:      Carotid pulses are 1+ on the right side, and 1+ on the left side.      Radial pulses are 1+ on the right side, and 1+ on the left side.       Femoral pulses are 1+ on the right side, and 1+ on the left side.      Popliteal pulses are 1+ on the right side, and 1+ on the left side.       Dorsalis pedis pulses are 1+ on the right side, and 1+ on the left side.       Posterior tibial pulses are 1+ on the right side, and 1+ on the left side.  Pulmonary/Chest: Effort normal and breath sounds normal. No stridor. No respiratory distress. She has no wheezes. She has no rales. She exhibits no tenderness.  Abdominal: Soft. Bowel sounds are normal. She exhibits no distension and no mass. There is no tenderness. There is no rebound and no guarding.  Musculoskeletal: Normal range of motion. She exhibits no edema and no tenderness.       Right knee: She exhibits deformity (DJD changes). She exhibits normal range of motion, no swelling, no effusion, no ecchymosis, no laceration, no erythema and no bony tenderness. no tenderness found.       Left knee: She exhibits deformity (DJD changes). She exhibits normal range of motion, no swelling, no effusion, no ecchymosis, no laceration, no erythema, normal alignment and no bony tenderness. no tenderness found.       Right ankle: She exhibits deformity (DJD noted). She exhibits normal range  of motion, no swelling, no ecchymosis, no laceration and normal pulse. no tenderness. No lateral malleolus and no medial malleolus tenderness found. Achilles tendon normal. Achilles tendon exhibits no pain, no defect and normal Thompson's test results.  Lymphadenopathy:    She has no cervical adenopathy.  Neurological: She is oriented to person, place, and time.  Skin: Skin is warm and dry. No rash noted. She  is not diaphoretic. No erythema. No pallor.  Psychiatric: She has a normal mood and affect. Her behavior is normal. Judgment and thought content normal.      Lab Results  Component Value Date   WBC 7.8 02/23/2012   HGB 12.0 02/23/2012   HCT 37.1 02/23/2012   PLT 239.0 02/23/2012   GLUCOSE 100* 03/13/2012   CHOL 133 02/23/2012   TRIG 79.0 02/23/2012   HDL 71.60 02/23/2012   LDLDIRECT 128.5 07/14/2011   LDLCALC 46 02/23/2012   ALT 18 02/23/2012   AST 28 02/23/2012   NA 139 03/13/2012   K 2.8* 03/13/2012   CL 105 03/13/2012   CREATININE 1.1 03/13/2012   BUN 8 03/13/2012   CO2 27 03/13/2012   TSH 2.02 02/23/2012   HGBA1C 5.9 02/23/2012      Assessment & Plan:

## 2012-05-14 NOTE — Assessment & Plan Note (Signed)
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

## 2012-05-14 NOTE — Assessment & Plan Note (Signed)
B12 injection today 

## 2012-05-14 NOTE — Patient Instructions (Signed)

## 2012-05-14 NOTE — Assessment & Plan Note (Signed)
I have asked her to be more compliant with the motrin and will see if tramadol helps with her pain

## 2012-05-14 NOTE — Assessment & Plan Note (Signed)
Her BP is well controlled today 

## 2012-05-14 NOTE — Assessment & Plan Note (Signed)
She has not been taking the K+ replacement b/c she thought that it made her feel dizzy, I will recheck her K+ and Mg++ levels today

## 2012-05-16 ENCOUNTER — Other Ambulatory Visit: Payer: Self-pay | Admitting: Internal Medicine

## 2012-05-17 ENCOUNTER — Encounter: Payer: Self-pay | Admitting: Internal Medicine

## 2012-05-17 ENCOUNTER — Other Ambulatory Visit: Payer: Self-pay | Admitting: Internal Medicine

## 2012-05-17 DIAGNOSIS — M1710 Unilateral primary osteoarthritis, unspecified knee: Secondary | ICD-10-CM

## 2012-05-17 MED ORDER — HYDROCODONE-ACETAMINOPHEN 5-325 MG PO TABS: 1.0000 | ORAL_TABLET | Freq: Four times a day (QID) | ORAL | Status: DC | PRN

## 2012-06-28 ENCOUNTER — Ambulatory Visit: Payer: PRIVATE HEALTH INSURANCE | Admitting: Internal Medicine

## 2012-07-11 ENCOUNTER — Other Ambulatory Visit: Payer: Self-pay | Admitting: Internal Medicine

## 2012-07-11 ENCOUNTER — Encounter: Payer: Self-pay | Admitting: Internal Medicine

## 2012-07-11 DIAGNOSIS — M79671 Pain in right foot: Secondary | ICD-10-CM

## 2012-07-11 MED ORDER — GABAPENTIN 300 MG PO CAPS: 300.0000 mg | ORAL_CAPSULE | Freq: Three times a day (TID) | ORAL | Status: DC

## 2012-07-16 ENCOUNTER — Encounter: Payer: Self-pay | Admitting: Internal Medicine

## 2012-07-16 ENCOUNTER — Other Ambulatory Visit: Payer: Self-pay | Admitting: Internal Medicine

## 2012-07-17 ENCOUNTER — Other Ambulatory Visit: Payer: Self-pay | Admitting: Internal Medicine

## 2012-07-17 DIAGNOSIS — IMO0001 Reserved for inherently not codable concepts without codable children: Secondary | ICD-10-CM

## 2012-07-17 MED ORDER — HYDROCODONE-ACETAMINOPHEN 5-325 MG PO TABS: 1.0000 | ORAL_TABLET | Freq: Three times a day (TID) | ORAL | Status: DC | PRN

## 2012-07-18 ENCOUNTER — Ambulatory Visit: Payer: PRIVATE HEALTH INSURANCE | Admitting: Internal Medicine

## 2012-08-06 ENCOUNTER — Other Ambulatory Visit: Payer: Self-pay | Admitting: Internal Medicine

## 2012-08-06 ENCOUNTER — Encounter: Payer: Self-pay | Admitting: Internal Medicine

## 2012-08-07 ENCOUNTER — Other Ambulatory Visit: Payer: Self-pay | Admitting: Internal Medicine

## 2012-08-07 DIAGNOSIS — M1710 Unilateral primary osteoarthritis, unspecified knee: Secondary | ICD-10-CM

## 2012-08-07 MED ORDER — HYDROCODONE-ACETAMINOPHEN 5-325 MG PO TABS: 1.0000 | ORAL_TABLET | Freq: Three times a day (TID) | ORAL | Status: DC | PRN

## 2012-08-14 ENCOUNTER — Other Ambulatory Visit: Payer: Self-pay | Admitting: Internal Medicine

## 2012-09-10 ENCOUNTER — Telehealth: Payer: Self-pay | Admitting: Internal Medicine

## 2012-09-10 NOTE — Telephone Encounter (Signed)
Patient Information:  Caller Name: Nadean  Phone: [phone]  Patient: Danielle Holt, Danielle Holt  Gender: Female  DOB: [birth year]  Age: 57 Years  PCP: Sanda Linger (Adults only)  Pregnant: No  Office Follow Up:  Does the office need to follow up with this patient?: No  Instructions For The Office: N/A  RN Note: Pt states she will go to  [facility]   Symptoms  Reason For Call & Symptoms: Chest pain for one month.  Apt scheduled 09/12/12 0915. Nagging cramp located in the middle or left side of her chest.  Heart flutters from time to time.  Pt states she feels like she has a gas bubble she can't get rid pass.    Reviewed Health History In EMR: Yes  Reviewed Medications In EMR: Yes  Reviewed Allergies In EMR: Yes  Reviewed Surgeries / Procedures: Yes  Date of Onset of Symptoms: 08/13/2012  Treatments Tried: Burping  Treatments Tried Worked: No OB / GYN:  LMP: Unknown  Guideline(s) Used:  Chest Pain  Disposition Per Guideline:   Go to ED Now  Reason For Disposition Reached:   Cocaine use within last 3 days  Advice Given:  Call Back If:  Difficulty breathing

## 2012-09-12 ENCOUNTER — Ambulatory Visit: Payer: PRIVATE HEALTH INSURANCE | Admitting: Internal Medicine

## 2012-10-04 ENCOUNTER — Encounter: Payer: Self-pay | Admitting: Internal Medicine

## 2012-10-04 ENCOUNTER — Ambulatory Visit (INDEPENDENT_AMBULATORY_CARE_PROVIDER_SITE_OTHER): Payer: Medicaid Other | Admitting: Internal Medicine

## 2012-10-04 VITALS — BP 120/72 | HR 95 | Temp 98.6°F | Resp 16 | Wt 211.0 lb

## 2012-10-04 DIAGNOSIS — G4700 Insomnia, unspecified: Secondary | ICD-10-CM

## 2012-10-04 DIAGNOSIS — E039 Hypothyroidism, unspecified: Secondary | ICD-10-CM

## 2012-10-04 DIAGNOSIS — F329 Major depressive disorder, single episode, unspecified: Secondary | ICD-10-CM

## 2012-10-04 DIAGNOSIS — IMO0001 Reserved for inherently not codable concepts without codable children: Secondary | ICD-10-CM

## 2012-10-04 DIAGNOSIS — F3289 Other specified depressive episodes: Secondary | ICD-10-CM

## 2012-10-04 DIAGNOSIS — E876 Hypokalemia: Secondary | ICD-10-CM

## 2012-10-04 DIAGNOSIS — E538 Deficiency of other specified B group vitamins: Secondary | ICD-10-CM

## 2012-10-04 DIAGNOSIS — I10 Essential (primary) hypertension: Secondary | ICD-10-CM

## 2012-10-04 MED ORDER — CYANOCOBALAMIN 1000 MCG/ML IJ SOLN
1000.0000 ug | Freq: Once | INTRAMUSCULAR | Status: AC
  Administered 2012-10-04: 1000 ug via INTRAMUSCULAR

## 2012-10-04 MED ORDER — VILAZODONE HCL 40 MG PO TABS: 40.0000 mg | ORAL_TABLET | Freq: Every day | ORAL | Status: DC

## 2012-10-04 MED ORDER — CLONAZEPAM 1 MG PO TABS: 1.0000 mg | ORAL_TABLET | Freq: Two times a day (BID) | ORAL | Status: DC | PRN

## 2012-10-04 NOTE — Progress Notes (Signed)
Subjective:    Patient ID: Danielle Holt, female    DOB: [birth year], 57 y.o.   MRN: [id number]  Anxiety Presents for follow-up visit. Symptoms include decreased concentration, depressed mood, excessive worry, insomnia, irritability, nervous/anxious behavior and panic. Patient reports no chest pain, compulsions, confusion, dizziness, dry mouth, feeling of choking, hyperventilation, malaise, muscle tension, nausea, obsessions, palpitations, restlessness, shortness of breath or suicidal ideas. Symptoms occur most days. The severity of symptoms is severe. The quality of sleep is fair. Nighttime awakenings: occasional.   Compliance with medications is 0-25% (she has been out of viibryd for one month).      Review of Systems  Constitutional: Positive for irritability. Negative for fever, chills, diaphoresis, activity change, appetite change, fatigue and unexpected weight change.  HENT: Negative.   Eyes: Negative.   Respiratory: Negative.  Negative for cough, chest tightness, shortness of breath, wheezing and stridor.   Cardiovascular: Negative.  Negative for chest pain, palpitations and leg swelling.  Gastrointestinal: Negative.  Negative for nausea, vomiting, abdominal pain, diarrhea and constipation.  Endocrine: Negative.   Genitourinary: Negative.   Musculoskeletal: Positive for arthralgias (knees). Negative for myalgias, back pain, joint swelling and gait problem.  Skin: Negative.  Negative for color change, pallor, rash and wound.  Allergic/Immunologic: Negative.   Neurological: Negative for dizziness, weakness, light-headedness, numbness and headaches.  Hematological: Negative.  Negative for adenopathy. Does not bruise/bleed easily.  Psychiatric/Behavioral: Positive for dysphoric mood and decreased concentration. Negative for suicidal ideas, hallucinations, behavioral problems, confusion, sleep disturbance and self-injury. The patient is nervous/anxious and has insomnia. The patient is  not hyperactive.        Objective:   Physical Exam  Vitals reviewed. Constitutional: She is oriented to person, place, and time. She appears well-developed and well-nourished. No distress.  HENT:  Head: Normocephalic and atraumatic.  Mouth/Throat: Oropharynx is clear and moist. No oropharyngeal exudate.  Eyes: Conjunctivae are normal. Right eye exhibits no discharge. Left eye exhibits no discharge. No scleral icterus.  Neck: Normal range of motion. Neck supple. No JVD present. No tracheal deviation present. No thyromegaly present.  Cardiovascular: Normal rate, regular rhythm, normal heart sounds and intact distal pulses.  Exam reveals no gallop and no friction rub.   No murmur heard. Pulmonary/Chest: Effort normal and breath sounds normal. No stridor. No respiratory distress. She has no wheezes. She has no rales. She exhibits no tenderness.  Abdominal: Soft. Bowel sounds are normal. She exhibits no distension and no mass. There is no tenderness. There is no rebound and no guarding.  Musculoskeletal: Normal range of motion. She exhibits no edema and no tenderness.  Lymphadenopathy:    She has no cervical adenopathy.  Neurological: She is oriented to person, place, and time.  Skin: Skin is warm and dry. No rash noted. She is not diaphoretic. No erythema. No pallor.  Psychiatric: Her speech is normal and behavior is normal. Judgment and thought content normal. Her mood appears anxious. Her affect is not angry, not blunt, not labile and not inappropriate. She is not agitated, not aggressive, not hyperactive, not slowed, not withdrawn, not actively hallucinating and not combative. Thought content is not paranoid and not delusional. Cognition and memory are normal. Cognition and memory are not impaired. She does not express impulsivity or inappropriate judgment. She exhibits a depressed mood (tearful). She expresses no homicidal and no suicidal ideation. She expresses no suicidal plans and no  homicidal plans. She exhibits normal recent memory and normal remote memory. She is attentive.  Lab Results  Component Value Date   WBC 7.8 02/23/2012   HGB 12.0 02/23/2012   HCT 37.1 02/23/2012   PLT 239.0 02/23/2012   GLUCOSE 96 05/14/2012   CHOL 133 02/23/2012   TRIG 79.0 02/23/2012   HDL 71.60 02/23/2012   LDLDIRECT 128.5 07/14/2011   LDLCALC 46 02/23/2012   ALT 18 02/23/2012   AST 28 02/23/2012   NA 141 05/14/2012   K 3.3* 05/14/2012   CL 110 05/14/2012   CREATININE 1.0 05/14/2012   BUN 12 05/14/2012   CO2 24 05/14/2012   TSH 2.02 02/23/2012   HGBA1C 5.9 02/23/2012       Assessment & Plan:

## 2012-10-04 NOTE — Patient Instructions (Signed)

## 2012-10-06 ENCOUNTER — Encounter: Payer: Self-pay | Admitting: Internal Medicine

## 2012-10-06 NOTE — Assessment & Plan Note (Signed)
She is due for an A1C test

## 2012-10-06 NOTE — Assessment & Plan Note (Signed)
I will check her TSH level today 

## 2012-10-06 NOTE — Assessment & Plan Note (Signed)
I have changed ativan to klonopin as her request She will restart viibryd

## 2012-10-06 NOTE — Assessment & Plan Note (Signed)
I will recheck her K+ level today 

## 2012-10-06 NOTE — Assessment & Plan Note (Signed)
Her BP is well controlled 

## 2012-10-08 ENCOUNTER — Other Ambulatory Visit: Payer: Self-pay | Admitting: Internal Medicine

## 2012-10-10 ENCOUNTER — Encounter: Payer: Self-pay | Admitting: Internal Medicine

## 2012-10-10 ENCOUNTER — Other Ambulatory Visit: Payer: Self-pay | Admitting: Internal Medicine

## 2012-10-10 DIAGNOSIS — J449 Chronic obstructive pulmonary disease, unspecified: Secondary | ICD-10-CM

## 2012-10-10 MED ORDER — ALBUTEROL SULFATE HFA 108 (90 BASE) MCG/ACT IN AERS: 2.0000 | INHALATION_SPRAY | Freq: Four times a day (QID) | RESPIRATORY_TRACT | Status: DC | PRN

## 2012-10-14 ENCOUNTER — Other Ambulatory Visit: Payer: Self-pay | Admitting: Internal Medicine

## 2012-10-14 MED ORDER — ZIPRASIDONE HCL 40 MG PO CAPS: ORAL_CAPSULE | ORAL | Status: DC

## 2012-10-15 ENCOUNTER — Other Ambulatory Visit: Payer: Self-pay | Admitting: Internal Medicine

## 2012-10-29 ENCOUNTER — Other Ambulatory Visit (INDEPENDENT_AMBULATORY_CARE_PROVIDER_SITE_OTHER): Payer: PRIVATE HEALTH INSURANCE

## 2012-10-29 ENCOUNTER — Ambulatory Visit (INDEPENDENT_AMBULATORY_CARE_PROVIDER_SITE_OTHER): Payer: PRIVATE HEALTH INSURANCE | Admitting: Internal Medicine

## 2012-10-29 ENCOUNTER — Encounter: Payer: Self-pay | Admitting: Internal Medicine

## 2012-10-29 VITALS — BP 140/86 | HR 76 | Temp 98.2°F | Resp 12 | Wt 214.0 lb

## 2012-10-29 DIAGNOSIS — I10 Essential (primary) hypertension: Secondary | ICD-10-CM

## 2012-10-29 DIAGNOSIS — E538 Deficiency of other specified B group vitamins: Secondary | ICD-10-CM

## 2012-10-29 DIAGNOSIS — Z1231 Encounter for screening mammogram for malignant neoplasm of breast: Secondary | ICD-10-CM | POA: Insufficient documentation

## 2012-10-29 DIAGNOSIS — E785 Hyperlipidemia, unspecified: Secondary | ICD-10-CM

## 2012-10-29 DIAGNOSIS — E876 Hypokalemia: Secondary | ICD-10-CM

## 2012-10-29 DIAGNOSIS — E039 Hypothyroidism, unspecified: Secondary | ICD-10-CM

## 2012-10-29 LAB — COMPREHENSIVE METABOLIC PANEL
Albumin: 3.8 g/dL (ref 3.5–5.2)
BUN: 15 mg/dL (ref 6–23)
Calcium: 9 mg/dL (ref 8.4–10.5)
Chloride: 103 mEq/L (ref 96–112)
Glucose, Bld: 80 mg/dL (ref 70–99)
Potassium: 3.6 mEq/L (ref 3.5–5.1)
Sodium: 135 mEq/L (ref 135–145)
Total Protein: 7.5 g/dL (ref 6.0–8.3)

## 2012-10-29 LAB — LIPID PANEL
Cholesterol: 215 mg/dL — ABNORMAL HIGH (ref 0–200)
Total CHOL/HDL Ratio: 3
VLDL: 23.2 mg/dL (ref 0.0–40.0)

## 2012-10-29 LAB — URINALYSIS, ROUTINE W REFLEX MICROSCOPIC
Bilirubin Urine: NEGATIVE
Ketones, ur: NEGATIVE
Leukocytes, UA: NEGATIVE
Nitrite: NEGATIVE
Urobilinogen, UA: 0.2 (ref 0.0–1.0)
pH: 6 (ref 5.0–8.0)

## 2012-10-29 LAB — CBC WITH DIFFERENTIAL/PLATELET
Basophils Relative: 0.5 % (ref 0.0–3.0)
Eosinophils Relative: 0.1 % (ref 0.0–5.0)
Lymphocytes Relative: 27.3 % (ref 12.0–46.0)
Monocytes Relative: 6.5 % (ref 3.0–12.0)
Neutrophils Relative %: 65.6 % (ref 43.0–77.0)
Platelets: 312 10*3/uL (ref 150.0–400.0)
RBC: 4.43 Mil/uL (ref 3.87–5.11)
WBC: 10.1 10*3/uL (ref 4.5–10.5)

## 2012-10-29 LAB — TSH: TSH: 1.69 u[IU]/mL (ref 0.35–5.50)

## 2012-10-29 LAB — LDL CHOLESTEROL, DIRECT: Direct LDL: 105.9 mg/dL

## 2012-10-29 LAB — HEMOGLOBIN A1C: Hgb A1c MFr Bld: 5.7 % (ref 4.6–6.5)

## 2012-10-29 LAB — MAGNESIUM: Magnesium: 1.9 mg/dL (ref 1.5–2.5)

## 2012-10-29 NOTE — Assessment & Plan Note (Signed)
I will check her FLP today and will adjust her dose if needed

## 2012-10-29 NOTE — Progress Notes (Signed)
  Subjective:    Patient ID: Danielle Holt, female    DOB: 05-19-1956, 57 y.o.   MRN: [id number]  Hypertension This is a chronic problem. The current episode started more than 1 year ago. The problem is controlled. Pertinent negatives include no anxiety, blurred vision, chest pain, headaches, malaise/fatigue, neck pain, orthopnea, palpitations, peripheral edema, PND, shortness of breath or sweats. Agents associated with hypertension include NSAIDs. Past treatments include angiotensin blockers. The current treatment provides moderate improvement. Compliance problems include exercise and diet.       Review of Systems  Constitutional: Negative.  Negative for fever, chills, malaise/fatigue, diaphoresis, activity change, appetite change, fatigue and unexpected weight change.  HENT: Negative.  Negative for neck pain.   Eyes: Negative.  Negative for blurred vision.  Respiratory: Negative.  Negative for apnea, cough, choking, chest tightness, shortness of breath, wheezing and stridor.   Cardiovascular: Negative.  Negative for chest pain, palpitations, orthopnea, leg swelling and PND.  Gastrointestinal: Negative.  Negative for nausea, vomiting, abdominal pain, diarrhea and constipation.  Endocrine: Negative.  Negative for polydipsia, polyphagia and polyuria.  Genitourinary: Negative.   Musculoskeletal: Negative.  Negative for myalgias, back pain, joint swelling and gait problem.  Skin: Negative.   Allergic/Immunologic: Negative.   Neurological: Negative.  Negative for dizziness, tremors, weakness, light-headedness and headaches.  Hematological: Negative.  Negative for adenopathy. Does not bruise/bleed easily.  Psychiatric/Behavioral: Negative.        Objective:   Physical Exam  Vitals reviewed. Constitutional: She is oriented to person, place, and time. She appears well-developed and well-nourished. No distress.  HENT:  Head: Normocephalic and atraumatic.  Mouth/Throat: Oropharynx is clear  and moist. No oropharyngeal exudate.  Eyes: Conjunctivae are normal. Right eye exhibits no discharge. Left eye exhibits no discharge. No scleral icterus.  Neck: Normal range of motion. Neck supple. No JVD present. No tracheal deviation present. No thyromegaly present.  Cardiovascular: Normal rate, regular rhythm, normal heart sounds and intact distal pulses.  Exam reveals no gallop and no friction rub.   No murmur heard. Pulmonary/Chest: Effort normal and breath sounds normal. No stridor. No respiratory distress. She has no wheezes. She has no rales. She exhibits no tenderness.  Abdominal: Soft. Bowel sounds are normal. She exhibits no distension and no mass. There is no tenderness. There is no rebound and no guarding.  Musculoskeletal: Normal range of motion. She exhibits no edema and no tenderness.  Lymphadenopathy:    She has no cervical adenopathy.  Neurological: She is oriented to person, place, and time.  Skin: Skin is warm and dry. No rash noted. She is not diaphoretic. No erythema. No pallor.  Psychiatric: She has a normal mood and affect. Her behavior is normal. Judgment and thought content normal.     Lab Results  Component Value Date   WBC 7.8 02/23/2012   HGB 12.0 02/23/2012   HCT 37.1 02/23/2012   PLT 239.0 02/23/2012   GLUCOSE 96 05/14/2012   CHOL 133 02/23/2012   TRIG 79.0 02/23/2012   HDL 71.60 02/23/2012   LDLDIRECT 128.5 07/14/2011   LDLCALC 46 02/23/2012   ALT 18 02/23/2012   AST 28 02/23/2012   NA 141 05/14/2012   K 3.3* 05/14/2012   CL 110 05/14/2012   CREATININE 1.0 05/14/2012   BUN 12 05/14/2012   CO2 24 05/14/2012   TSH 2.02 02/23/2012   HGBA1C 5.9 02/23/2012       Assessment & Plan:

## 2012-10-29 NOTE — Assessment & Plan Note (Signed)
I will recheck her TSH today and will adjust her dose if needed 

## 2012-10-29 NOTE — Assessment & Plan Note (Signed)
I will check her K+ and Mg++ levels today and will address them if needed

## 2012-10-29 NOTE — Patient Instructions (Signed)

## 2012-10-29 NOTE — Assessment & Plan Note (Signed)
I will check her CBC and B12 level today 

## 2012-10-29 NOTE — Assessment & Plan Note (Signed)
Her BP is well controlled Today I will check her lytes and renal function 

## 2012-11-04 ENCOUNTER — Other Ambulatory Visit: Payer: Self-pay | Admitting: Internal Medicine

## 2012-11-04 DIAGNOSIS — M1710 Unilateral primary osteoarthritis, unspecified knee: Secondary | ICD-10-CM

## 2012-11-04 MED ORDER — HYDROCODONE-ACETAMINOPHEN 5-325 MG PO TABS: 1.0000 | ORAL_TABLET | Freq: Three times a day (TID) | ORAL | Status: DC | PRN

## 2012-11-05 ENCOUNTER — Ambulatory Visit (HOSPITAL_COMMUNITY): Payer: PRIVATE HEALTH INSURANCE

## 2012-11-19 ENCOUNTER — Ambulatory Visit (HOSPITAL_COMMUNITY): Payer: PRIVATE HEALTH INSURANCE

## 2012-11-21 ENCOUNTER — Encounter: Payer: Self-pay | Admitting: Internal Medicine

## 2012-11-28 ENCOUNTER — Ambulatory Visit (HOSPITAL_COMMUNITY): Payer: PRIVATE HEALTH INSURANCE

## 2013-01-02 ENCOUNTER — Encounter: Payer: Self-pay | Admitting: Internal Medicine

## 2013-01-02 ENCOUNTER — Ambulatory Visit (INDEPENDENT_AMBULATORY_CARE_PROVIDER_SITE_OTHER): Payer: PRIVATE HEALTH INSURANCE | Admitting: Internal Medicine

## 2013-01-02 VITALS — BP 140/84 | HR 80 | Temp 98.5°F | Resp 16 | Wt 223.0 lb

## 2013-01-02 DIAGNOSIS — I10 Essential (primary) hypertension: Secondary | ICD-10-CM

## 2013-01-02 DIAGNOSIS — Z1231 Encounter for screening mammogram for malignant neoplasm of breast: Secondary | ICD-10-CM

## 2013-01-02 DIAGNOSIS — G43909 Migraine, unspecified, not intractable, without status migrainosus: Secondary | ICD-10-CM | POA: Insufficient documentation

## 2013-01-02 DIAGNOSIS — IMO0002 Reserved for concepts with insufficient information to code with codable children: Secondary | ICD-10-CM

## 2013-01-02 DIAGNOSIS — M5416 Radiculopathy, lumbar region: Secondary | ICD-10-CM | POA: Insufficient documentation

## 2013-01-02 MED ORDER — ELETRIPTAN HYDROBROMIDE 40 MG PO TABS: 40.0000 mg | ORAL_TABLET | ORAL | Status: DC | PRN

## 2013-01-02 NOTE — Patient Instructions (Signed)
Migraine Headache A migraine headache is an intense, throbbing pain on one or both sides of your head. A migraine can last for 30 minutes to several hours. CAUSES  The exact cause of a migraine headache is not always known. However, a migraine may be caused when nerves in the brain become irritated and release chemicals that cause inflammation. This causes pain. SYMPTOMS  Pain on one or both sides of your head.  Pulsating or throbbing pain.  Severe pain that prevents daily activities.  Pain that is aggravated by any physical activity.  Nausea, vomiting, or both.  Dizziness.  Pain with exposure to bright lights, loud noises, or activity.  General sensitivity to bright lights, loud noises, or smells. Before you get a migraine, you may get warning signs that a migraine is coming (aura). An aura may include:  Seeing flashing lights.  Seeing bright spots, halos, or zig-zag lines.  Having tunnel vision or blurred vision.  Having feelings of numbness or tingling.  Having trouble talking.  Having muscle weakness. MIGRAINE TRIGGERS  Alcohol.  Smoking.  Stress.  Menstruation.  Aged cheeses.  Foods or drinks that contain nitrates, glutamate, aspartame, or tyramine.  Lack of sleep.  Chocolate.  Caffeine.  Hunger.  Physical exertion.  Fatigue.  Medicines used to treat chest pain (nitroglycerine), birth control pills, estrogen, and some blood pressure medicines. DIAGNOSIS  A migraine headache is often diagnosed based on:  Symptoms.  Physical examination.  A CT scan or MRI of your head. TREATMENT Medicines may be given for pain and nausea. Medicines can also be given to help prevent recurrent migraines.  HOME CARE INSTRUCTIONS  Only take over-the-counter or prescription medicines for pain or discomfort as directed by your caregiver. The use of long-term narcotics is not recommended.  Lie down in a dark, quiet room when you have a migraine.  Keep a journal  to find out what may trigger your migraine headaches. For example, write down:  What you eat and drink.  How much sleep you get.  Any change to your diet or medicines.  Limit alcohol consumption.  Quit smoking if you smoke.  Get 7 to 9 hours of sleep, or as recommended by your caregiver.  Limit stress.  Keep lights dim if bright lights bother you and make your migraines worse. SEEK IMMEDIATE MEDICAL CARE IF:   Your migraine becomes severe.  You have a fever.  You have a stiff neck.  You have vision loss.  You have muscular weakness or loss of muscle control.  You start losing your balance or have trouble walking.  You feel faint or pass out.  You have severe symptoms that are different from your first symptoms. MAKE SURE YOU:   Understand these instructions.  Will watch your condition.  Will get help right away if you are not doing well or get worse. Document Released: 06/26/2005 Document Revised: 09/18/2011 Document Reviewed: 06/16/2011 [employer] Patient Information 2014 [employer].  

## 2013-01-02 NOTE — Progress Notes (Signed)
Subjective:    Patient ID: Danielle Holt, female    DOB: 1956-01-18, 57 y.o.   MRN: [id number]  Headache  This is a new problem. Episode onset: 5 days ago. The problem occurs intermittently. The problem has been gradually improving. The pain is located in the bilateral region. The pain does not radiate. The pain quality is similar to prior headaches. The quality of the pain is described as aching. The pain is at a severity of 2/10. The pain is mild. Associated symptoms include back pain (radiates into her left leg with numbness and tingling), nausea, phonophobia and photophobia. Pertinent negatives include no abdominal pain, abnormal behavior, anorexia, blurred vision, coughing, dizziness, drainage, ear pain, eye pain, eye redness, eye watering, facial sweating, fever, hearing loss, insomnia, loss of balance, muscle aches, neck pain, numbness, rhinorrhea, seizures, sinus pressure, sore throat, swollen glands, tingling, tinnitus, visual change, vomiting, weakness or weight loss. Nothing aggravates the symptoms. She has tried oral narcotics, acetaminophen and NSAIDs for the symptoms. The treatment provided mild relief. Her past medical history is significant for hypertension, migraine headaches, migraines in the family and obesity. There is no history of cancer, cluster headaches, immunosuppression, pseudotumor cerebri, recent head traumas, sinus disease or TMJ.      Review of Systems  Constitutional: Negative for fever, chills, weight loss, diaphoresis, activity change, appetite change, fatigue and unexpected weight change.  HENT: Negative for hearing loss, ear pain, sore throat, rhinorrhea, neck pain, sinus pressure and tinnitus.   Eyes: Positive for photophobia. Negative for blurred vision, pain and redness.  Respiratory: Negative.  Negative for cough, shortness of breath, wheezing and stridor.   Cardiovascular: Negative.  Negative for chest pain, palpitations and leg swelling.   Gastrointestinal: Positive for nausea. Negative for vomiting, abdominal pain, diarrhea, constipation and anorexia.  Endocrine: Negative.   Musculoskeletal: Positive for back pain (radiates into her left leg with numbness and tingling). Negative for myalgias, joint swelling and gait problem.  Skin: Negative.   Allergic/Immunologic: Negative.   Neurological: Positive for headaches. Negative for dizziness, tingling, seizures, weakness, numbness and loss of balance.  Hematological: Negative.   Psychiatric/Behavioral: Negative.  The patient does not have insomnia.        Objective:   Physical Exam  Vitals reviewed. Constitutional: She is oriented to person, place, and time. She appears well-developed and well-nourished. No distress.  HENT:  Head: Normocephalic and atraumatic.  Mouth/Throat: Oropharynx is clear and moist. No oropharyngeal exudate.  Eyes: Conjunctivae are normal. Right eye exhibits no discharge. Left eye exhibits no discharge. No scleral icterus.  Neck: Normal range of motion. Neck supple. No JVD present. No tracheal deviation present. No thyromegaly present.  Cardiovascular: Normal rate, regular rhythm, normal heart sounds and intact distal pulses.  Exam reveals no gallop and no friction rub.   No murmur heard. Pulmonary/Chest: Effort normal and breath sounds normal. No stridor. No respiratory distress. She has no wheezes. She has no rales. She exhibits no tenderness.  Abdominal: Soft. Bowel sounds are normal. She exhibits no distension and no mass. There is no tenderness. There is no rebound and no guarding.  Musculoskeletal: Normal range of motion. She exhibits no edema and no tenderness.       Lumbar back: Normal. She exhibits normal range of motion, no tenderness, no bony tenderness, no swelling, no edema, no deformity, no laceration, no pain, no spasm and normal pulse.  Lymphadenopathy:    She has no cervical adenopathy.  Neurological: She is alert and oriented to  person, place, and time. She has normal strength. She displays no atrophy, no tremor and normal reflexes. No cranial nerve deficit or sensory deficit. She exhibits normal muscle tone. She displays a negative Romberg sign. She displays no seizure activity. Coordination and gait normal. She displays no Babinski's sign on the right side.  Reflex Scores:      Tricep reflexes are 1+ on the right side and 1+ on the left side.      Bicep reflexes are 1+ on the right side and 1+ on the left side.      Brachioradialis reflexes are 1+ on the right side and 1+ on the left side.      Patellar reflexes are 1+ on the right side and 1+ on the left side.      Achilles reflexes are 1+ on the right side and 1+ on the left side. Neg SLR in BLE  Skin: Skin is warm and dry. No rash noted. She is not diaphoretic. No erythema. No pallor.  Psychiatric: She has a normal mood and affect. Her behavior is normal. Judgment and thought content normal.     Lab Results  Component Value Date   WBC 10.1 10/29/2012   HGB 13.0 10/29/2012   HCT 38.6 10/29/2012   PLT 312.0 10/29/2012   GLUCOSE 80 10/29/2012   CHOL 215* 10/29/2012   TRIG 116.0 10/29/2012   HDL 83.30 10/29/2012   LDLDIRECT 105.9 10/29/2012   LDLCALC 46 02/23/2012   ALT 20 10/29/2012   AST 23 10/29/2012   NA 135 10/29/2012   K 3.6 10/29/2012   CL 103 10/29/2012   CREATININE 0.9 10/29/2012   BUN 15 10/29/2012   CO2 26 10/29/2012   TSH 1.69 10/29/2012   HGBA1C 5.7 10/29/2012       Assessment & Plan:

## 2013-01-03 ENCOUNTER — Other Ambulatory Visit: Payer: Self-pay | Admitting: Internal Medicine

## 2013-01-03 ENCOUNTER — Telehealth: Payer: Self-pay

## 2013-01-03 DIAGNOSIS — M5416 Radiculopathy, lumbar region: Secondary | ICD-10-CM

## 2013-01-03 MED ORDER — TIZANIDINE HCL 4 MG PO TABS: 4.0000 mg | ORAL_TABLET | Freq: Two times a day (BID) | ORAL | Status: DC | PRN

## 2013-01-03 MED ORDER — CYCLOBENZAPRINE HCL 10 MG PO TABS: 10.0000 mg | ORAL_TABLET | Freq: Two times a day (BID) | ORAL | Status: DC | PRN

## 2013-01-03 NOTE — Telephone Encounter (Signed)
done

## 2013-01-03 NOTE — Telephone Encounter (Signed)
Per fax from [facility], cyclobenzaprine is not covered by insurance plan. Preferred's are meloxicam, diclofenac, or Tizanidine

## 2013-01-03 NOTE — Assessment & Plan Note (Signed)
Her BP is well controlled 

## 2013-01-03 NOTE — Assessment & Plan Note (Signed)
She has no focal neuro s/s and has a hx of migraine so I have asked her to try relpax

## 2013-01-03 NOTE — Assessment & Plan Note (Signed)
She will continue her current meds and I have asked her to see pain management for further evaluation

## 2013-01-08 ENCOUNTER — Other Ambulatory Visit: Payer: Self-pay | Admitting: Internal Medicine

## 2013-01-08 NOTE — Telephone Encounter (Signed)
Per previous phone note from [facility], cyclobenzaprine is not covered by insurance plan. Preferred's are meloxicam, diclofenac, or Tizanidine. Medication was changed to tizanidine

## 2013-01-14 ENCOUNTER — Ambulatory Visit (INDEPENDENT_AMBULATORY_CARE_PROVIDER_SITE_OTHER): Payer: PRIVATE HEALTH INSURANCE | Admitting: Internal Medicine

## 2013-01-14 VITALS — BP 118/82 | HR 90 | Temp 98.8°F | Resp 16 | Wt 233.0 lb

## 2013-01-14 DIAGNOSIS — M5416 Radiculopathy, lumbar region: Secondary | ICD-10-CM

## 2013-01-14 DIAGNOSIS — M67439 Ganglion, unspecified wrist: Secondary | ICD-10-CM | POA: Insufficient documentation

## 2013-01-14 DIAGNOSIS — IMO0002 Reserved for concepts with insufficient information to code with codable children: Secondary | ICD-10-CM

## 2013-01-14 DIAGNOSIS — M67432 Ganglion, left wrist: Secondary | ICD-10-CM

## 2013-01-14 DIAGNOSIS — M6740 Ganglion, unspecified site: Secondary | ICD-10-CM

## 2013-01-14 NOTE — Patient Instructions (Signed)
Back Pain, Adult  Low back pain is very common. About 1 in 5 people have back pain. The cause of low back pain is rarely dangerous. The pain often gets better over time. About half of people with a sudden onset of back pain feel better in just 2 weeks. About 8 in 10 people feel better by 6 weeks.   CAUSES  Some common causes of back pain include:  · Strain of the muscles or ligaments supporting the spine.  · Wear and tear (degeneration) of the spinal discs.  · Arthritis.  · Direct injury to the back.  DIAGNOSIS  Most of the time, the direct cause of low back pain is not known. However, back pain can be treated effectively even when the exact cause of the pain is unknown. Answering your caregiver's questions about your overall health and symptoms is one of the most accurate ways to make sure the cause of your pain is not dangerous. If your caregiver needs more information, he or she may order lab work or imaging tests (X-rays or MRIs). However, even if imaging tests show changes in your back, this usually does not require surgery.  HOME CARE INSTRUCTIONS  For many people, back pain returns. Since low back pain is rarely dangerous, it is often a condition that people can learn to manage on their own.   · Remain active. It is stressful on the back to sit or stand in one place. Do not sit, drive, or stand in one place for more than 30 minutes at a time. Take short walks on level surfaces as soon as pain allows. Try to increase the length of time you walk each day.  · Do not stay in bed. Resting more than 1 or 2 days can delay your recovery.  · Do not avoid exercise or work. Your body is made to move. It is not dangerous to be active, even though your back may hurt. Your back will likely heal faster if you return to being active before your pain is gone.  · Pay attention to your body when you  bend and lift. Many people have less discomfort when lifting if they bend their knees, keep the load close to their bodies, and  avoid twisting. Often, the most comfortable positions are those that put less stress on your recovering back.  · Find a comfortable position to sleep. Use a firm mattress and lie on your side with your knees slightly bent. If you lie on your back, put a pillow under your knees.  · Only take over-the-counter or prescription medicines as directed by your caregiver. Over-the-counter medicines to reduce pain and inflammation are often the most helpful. Your caregiver may prescribe muscle relaxant drugs. These medicines help dull your pain so you can more quickly return to your normal activities and healthy exercise.  · Put ice on the injured area.  · Put ice in a plastic bag.  · Place a towel between your skin and the bag.  · Leave the ice on for 15-20 minutes, 3-4 times a day for the first 2 to 3 days. After that, ice and heat may be alternated to reduce pain and spasms.  · Ask your caregiver about trying back exercises and gentle massage. This may be of some benefit.  · Avoid feeling anxious or stressed. Stress increases muscle tension and can worsen back pain. It is important to recognize when you are anxious or stressed and learn ways to manage it. Exercise is a great option.  SEEK MEDICAL CARE IF:  · You have pain that is not relieved with rest or   medicine.  · You have pain that does not improve in 1 week.  · You have new symptoms.  · You are generally not feeling well.  SEEK IMMEDIATE MEDICAL CARE IF:   · You have pain that radiates from your back into your legs.  · You develop new bowel or bladder control problems.  · You have unusual weakness or numbness in your arms or legs.  · You develop nausea or vomiting.  · You develop abdominal pain.  · You feel faint.  Document Released: 06/26/2005 Document Revised: 12/26/2011 Document Reviewed: 11/14/2010  ExitCare® Patient Information ©2014 [employer].

## 2013-01-15 ENCOUNTER — Encounter: Payer: Self-pay | Admitting: Internal Medicine

## 2013-01-15 NOTE — Progress Notes (Signed)
Subjective:    Patient ID: Danielle Holt, female    DOB: 09-03-1955, 57 y.o.   MRN: [id number]  Back Pain This is a recurrent problem. The current episode started more than 1 month ago. The problem occurs intermittently. The problem is unchanged. The pain is present in the lumbar spine. The quality of the pain is described as aching and burning. The pain radiates to the left thigh. The pain is at a severity of 4/10. The pain is moderate. The pain is worse during the day. The symptoms are aggravated by bending and position. Associated symptoms include numbness and paresthesias (in left leg). Pertinent negatives include no abdominal pain, bladder incontinence, bowel incontinence, chest pain, dysuria, fever, headaches, leg pain, paresis, pelvic pain, perianal numbness, tingling, weakness or weight loss. Risk factors include obesity and lack of exercise. She has tried muscle relaxant and analgesics for the symptoms. The treatment provided mild relief.      Review of Systems  Constitutional: Negative.  Negative for fever, chills, weight loss, diaphoresis, activity change, appetite change, fatigue and unexpected weight change.  HENT: Negative.   Eyes: Negative.   Respiratory: Negative.  Negative for cough, chest tightness, shortness of breath, wheezing and stridor.   Cardiovascular: Negative.  Negative for chest pain, palpitations and leg swelling.  Gastrointestinal: Negative.  Negative for abdominal pain and bowel incontinence.  Endocrine: Negative.   Genitourinary: Negative.  Negative for bladder incontinence, dysuria and pelvic pain.  Musculoskeletal: Positive for back pain and arthralgias (cyst on left wrist). Negative for myalgias, joint swelling and gait problem.  Skin: Negative.   Allergic/Immunologic: Negative.   Neurological: Positive for numbness and paresthesias (in left leg). Negative for dizziness, tingling, weakness and headaches.  Hematological: Negative.  Negative for adenopathy.  Does not bruise/bleed easily.  Psychiatric/Behavioral: Negative.        Objective:   Physical Exam  Vitals reviewed. Constitutional: She appears well-developed and well-nourished. No distress.  HENT:  Head: Normocephalic and atraumatic.  Mouth/Throat: Oropharynx is clear and moist. No oropharyngeal exudate.  Eyes: Conjunctivae are normal. Right eye exhibits no discharge. Left eye exhibits no discharge. No scleral icterus.  Neck: Normal range of motion. Neck supple. No JVD present. No tracheal deviation present. No thyromegaly present.  Cardiovascular: Normal rate, regular rhythm, normal heart sounds and intact distal pulses.  Exam reveals no gallop and no friction rub.   No murmur heard. Pulmonary/Chest: Effort normal and breath sounds normal. No stridor. No respiratory distress. She has no wheezes. She has no rales. She exhibits no tenderness.  Abdominal: Soft. Bowel sounds are normal. She exhibits no distension and no mass. There is no tenderness. There is no rebound and no guarding.  Musculoskeletal: Normal range of motion. She exhibits no edema and no tenderness.       Left wrist: She exhibits deformity (ganglion cyst volar lateral side of left wrist). She exhibits normal range of motion, no tenderness, no bony tenderness, no swelling, no effusion, no crepitus and no laceration.  Lymphadenopathy:    She has no cervical adenopathy.  Neurological: She is alert. She has normal strength. She displays no atrophy, no tremor and normal reflexes. No cranial nerve deficit or sensory deficit. She exhibits normal muscle tone. She displays a negative Romberg sign. She displays no seizure activity. Coordination and gait normal. She displays no Babinski's sign on the right side. She displays no Babinski's sign on the left side.  Reflex Scores:      Tricep reflexes are 0 on the  right side and 0 on the left side.      Bicep reflexes are 0 on the right side and 0 on the left side.      Brachioradialis  reflexes are 0 on the right side and 0 on the left side.      Patellar reflexes are 0 on the right side and 0 on the left side.      Achilles reflexes are 0 on the right side and 0 on the left side. Neg SLR in BLE  Skin: Skin is warm and dry. No rash noted. She is not diaphoretic. No erythema. No pallor.  Psychiatric: She has a normal mood and affect. Her behavior is normal. Judgment and thought content normal.          Assessment & Plan:

## 2013-01-15 NOTE — Assessment & Plan Note (Signed)
Ortho referral  

## 2013-01-15 NOTE — Assessment & Plan Note (Signed)
I have ordered an MRI to see if there is spinal stenosis, HNP, nerve impingement. Will also use the MRI results to guide therapy (ESI, consider surgery) She will continue the current meds for now

## 2013-02-13 ENCOUNTER — Ambulatory Visit (INDEPENDENT_AMBULATORY_CARE_PROVIDER_SITE_OTHER): Payer: PRIVATE HEALTH INSURANCE | Admitting: Internal Medicine

## 2013-02-13 ENCOUNTER — Encounter: Payer: Self-pay | Admitting: Internal Medicine

## 2013-02-13 ENCOUNTER — Ambulatory Visit: Payer: PRIVATE HEALTH INSURANCE | Admitting: Physical Therapy

## 2013-02-13 VITALS — BP 126/78 | HR 86 | Temp 98.7°F | Resp 16 | Wt 223.0 lb

## 2013-02-13 DIAGNOSIS — F329 Major depressive disorder, single episode, unspecified: Secondary | ICD-10-CM

## 2013-02-13 DIAGNOSIS — F3289 Other specified depressive episodes: Secondary | ICD-10-CM

## 2013-02-13 DIAGNOSIS — E66813 Obesity, class 3: Secondary | ICD-10-CM

## 2013-02-13 MED ORDER — LEVOMILNACIPRAN HCL ER 40 MG PO CP24: 1.0000 | ORAL_CAPSULE | Freq: Every day | ORAL | Status: DC

## 2013-02-13 NOTE — Patient Instructions (Signed)

## 2013-02-13 NOTE — Progress Notes (Signed)
  Subjective:    Patient ID: Danielle Holt, female    DOB: 10-22-1955, 57 y.o.   MRN: [id number]  HPI  She returns for f/up and tells me that she had to stop taking viibryd due a sensation of blurred vision, she stopped it a week ago and her vision is back to normal. She is starting to feel depressed again (sadness, crying spells, wt gain, appetite is up, lack of concentration, and fatigue.) She would like to change to something else.  Review of Systems  Constitutional: Positive for unexpected weight change. Negative for fever (some weight gain), chills, diaphoresis, activity change, appetite change and fatigue.  HENT: Negative.   Eyes: Negative.   Respiratory: Negative.  Negative for apnea, cough, choking, shortness of breath, wheezing and stridor.   Cardiovascular: Negative.  Negative for chest pain, palpitations and leg swelling.  Gastrointestinal: Negative.  Negative for nausea, vomiting, abdominal pain, diarrhea and constipation.  Endocrine: Negative.   Genitourinary: Negative.   Musculoskeletal: Positive for back pain. Negative for myalgias, joint swelling and gait problem.  Skin: Negative.   Allergic/Immunologic: Negative.   Neurological: Negative.   Hematological: Negative.  Negative for adenopathy. Does not bruise/bleed easily.  Psychiatric/Behavioral: Negative.        Objective:   Physical Exam  Vitals reviewed. Constitutional: She is oriented to person, place, and time. She appears well-developed and well-nourished. No distress.  HENT:  Head: Normocephalic and atraumatic.  Mouth/Throat: Oropharynx is clear and moist. No oropharyngeal exudate.  Eyes: Conjunctivae are normal. Right eye exhibits no discharge. Left eye exhibits no discharge. No scleral icterus.  Neck: Normal range of motion. Neck supple. No JVD present. No tracheal deviation present. No thyromegaly present.  Cardiovascular: Normal rate, regular rhythm, normal heart sounds and intact distal pulses.  Exam  reveals no gallop and no friction rub.   No murmur heard. Pulmonary/Chest: Effort normal and breath sounds normal. No stridor. No respiratory distress. She has no wheezes. She has no rales. She exhibits no tenderness.  Abdominal: Soft. Bowel sounds are normal. She exhibits no distension and no mass. There is no tenderness. There is no rebound and no guarding.  Musculoskeletal: Normal range of motion. She exhibits no edema and no tenderness.  Lymphadenopathy:    She has no cervical adenopathy.  Neurological: She is oriented to person, place, and time.  Skin: Skin is warm and dry. No rash noted. She is not diaphoretic. No erythema. No pallor.  Psychiatric: She has a normal mood and affect. Her behavior is normal. Judgment and thought content normal.      Lab Results  Component Value Date   WBC 10.1 10/29/2012   HGB 13.0 10/29/2012   HCT 38.6 10/29/2012   PLT 312.0 10/29/2012   GLUCOSE 80 10/29/2012   CHOL 215* 10/29/2012   TRIG 116.0 10/29/2012   HDL 83.30 10/29/2012   LDLDIRECT 105.9 10/29/2012   LDLCALC 46 02/23/2012   ALT 20 10/29/2012   AST 23 10/29/2012   NA 135 10/29/2012   K 3.6 10/29/2012   CL 103 10/29/2012   CREATININE 0.9 10/29/2012   BUN 15 10/29/2012   CO2 26 10/29/2012   TSH 1.69 10/29/2012   HGBA1C 5.7 10/29/2012      Assessment & Plan:

## 2013-02-14 MED ORDER — DEPLIN 15 15-90.314 MG PO CAPS: 1.0000 | ORAL_CAPSULE | Freq: Every day | ORAL | Status: DC

## 2013-02-14 NOTE — Assessment & Plan Note (Addendum)
She feels like viibryd has affected her vision so will stop it She tells me that wellbutrin and geodon have helped some but she feels like she will need something else from keeping her from sinking into depression again so I have asked her to try fetzima and I think deplin would help as well since she is obese

## 2013-02-14 NOTE — Assessment & Plan Note (Signed)
She is working on her diet and exercise regimen to lose weight 

## 2013-02-18 ENCOUNTER — Ambulatory Visit: Payer: PRIVATE HEALTH INSURANCE | Admitting: Physical Therapy

## 2013-02-20 ENCOUNTER — Inpatient Hospital Stay: Admission: RE | Admit: 2013-02-20 | Payer: PRIVATE HEALTH INSURANCE | Source: Ambulatory Visit

## 2013-03-04 ENCOUNTER — Other Ambulatory Visit: Payer: Self-pay | Admitting: Internal Medicine

## 2013-03-06 ENCOUNTER — Other Ambulatory Visit: Payer: Self-pay | Admitting: Internal Medicine

## 2013-03-07 MED ORDER — LEVOTHYROXINE SODIUM 13 MCG PO CAPS: 13.0000 ug | ORAL_CAPSULE | Freq: Every day | ORAL | Status: DC

## 2013-03-11 ENCOUNTER — Telehealth: Payer: Self-pay | Admitting: Internal Medicine

## 2013-03-11 NOTE — Telephone Encounter (Signed)
Caller: Danielle Holt/Patient; Phone: [phone]; Reason for Call: Patient admitted to [facility], room 276; patient does not believe she is receiving proper care.  She would like to make Dr Yetta Barre aware of this.  Please call her to discuss.

## 2013-03-13 LAB — HM COLONOSCOPY: HM Colonoscopy: NORMAL

## 2013-03-18 ENCOUNTER — Ambulatory Visit: Payer: PRIVATE HEALTH INSURANCE | Admitting: Internal Medicine

## 2013-03-27 ENCOUNTER — Encounter: Payer: Self-pay | Admitting: Internal Medicine

## 2013-03-27 ENCOUNTER — Ambulatory Visit (INDEPENDENT_AMBULATORY_CARE_PROVIDER_SITE_OTHER): Payer: PRIVATE HEALTH INSURANCE | Admitting: Internal Medicine

## 2013-03-27 ENCOUNTER — Other Ambulatory Visit (INDEPENDENT_AMBULATORY_CARE_PROVIDER_SITE_OTHER): Payer: PRIVATE HEALTH INSURANCE

## 2013-03-27 VITALS — BP 130/84 | HR 80 | Temp 99.0°F | Resp 16 | Wt 230.0 lb

## 2013-03-27 DIAGNOSIS — D649 Anemia, unspecified: Secondary | ICD-10-CM

## 2013-03-27 DIAGNOSIS — I10 Essential (primary) hypertension: Secondary | ICD-10-CM

## 2013-03-27 DIAGNOSIS — E039 Hypothyroidism, unspecified: Secondary | ICD-10-CM

## 2013-03-27 DIAGNOSIS — E876 Hypokalemia: Secondary | ICD-10-CM

## 2013-03-27 DIAGNOSIS — F329 Major depressive disorder, single episode, unspecified: Secondary | ICD-10-CM

## 2013-03-27 LAB — CBC WITH DIFFERENTIAL/PLATELET
Basophils Relative: 0.6 % (ref 0.0–3.0)
Eosinophils Absolute: 0.1 10*3/uL (ref 0.0–0.7)
Eosinophils Relative: 1 % (ref 0.0–5.0)
Hemoglobin: 10.1 g/dL — ABNORMAL LOW (ref 12.0–15.0)
Lymphocytes Relative: 34.9 % (ref 12.0–46.0)
Monocytes Relative: 7.5 % (ref 3.0–12.0)
Neutro Abs: 3.9 10*3/uL (ref 1.4–7.7)
Neutrophils Relative %: 56 % (ref 43.0–77.0)
RBC: 3.49 Mil/uL — ABNORMAL LOW (ref 3.87–5.11)
WBC: 6.9 10*3/uL (ref 4.5–10.5)

## 2013-03-27 LAB — BASIC METABOLIC PANEL
BUN: 11 mg/dL (ref 6–23)
Calcium: 8.8 mg/dL (ref 8.4–10.5)
GFR: 90.4 mL/min (ref >60.00)
Glucose, Bld: 94 mg/dL (ref 70–99)

## 2013-03-27 LAB — TSH: TSH: 0.6 u[IU]/mL (ref 0.35–5.50)

## 2013-03-27 LAB — FERRITIN: Ferritin: 28.2 ng/mL (ref 10.0–291.0)

## 2013-03-27 LAB — IBC PANEL: Iron: 40 ug/dL — ABNORMAL LOW (ref 42–145)

## 2013-03-27 MED ORDER — LEVOMILNACIPRAN HCL ER 40 MG PO CP24: 1.0000 | ORAL_CAPSULE | Freq: Every day | ORAL | Status: DC

## 2013-03-27 MED ORDER — FERROUS SULFATE 325 (65 FE) MG PO TABS: 325.0000 mg | ORAL_TABLET | Freq: Every day | ORAL | Status: DC

## 2013-03-27 NOTE — Assessment & Plan Note (Signed)
I will recheck her K+ level today 

## 2013-03-27 NOTE — Patient Instructions (Signed)
Anemia, Nonspecific  Your exam and blood tests show you are anemic. This means your blood (hemoglobin) level is low. Normal hemoglobin values are 12 to 15 g/dL for females and 14 to 17 g/dL for males. Make a note of your hemoglobin level today. The hematocrit percent is also used to measure anemia. A normal hematocrit is 38% to 46% in females and 42% to 49% in males. Make a note of your hematocrit level today.  CAUSES   Anemia can be due to many different causes.   Excessive bleeding from periods (in women).   Intestinal bleeding.   Poor nutrition.   Kidney, thyroid, liver, and bone marrow diseases.  SYMPTOMS   Anemia can come on suddenly (acute). It can also come on slowly. Symptoms can include:   Minor weakness.   Dizziness.   Palpitations.   Shortness of breath.  Symptoms may be absent until half your hemoglobin is missing if it comes on slowly. Anemia due to acute blood loss from an injury or internal bleeding may require blood transfusion if the loss is severe. Hospital care is needed if you are anemic and there is significant continual blood loss.  TREATMENT    Stool tests for blood (Hemoccult) and additional lab tests are often needed. This determines the best treatment.   Further checking on your condition and your response to treatment is very important. It often takes many weeks to correct anemia.  Depending on the cause, treatment can include:   Supplements of iron.   Vitamins B12 and folic acid.   Hormone medicines.  If your anemia is due to bleeding, finding the cause of the blood loss is very important. This will help avoid further problems.  SEEK IMMEDIATE MEDICAL CARE IF:    You develop fainting, extreme weakness, shortness of breath, or chest pain.   You develop heavy vaginal bleeding.   You develop bloody or black, tarry stools or vomit up blood.   You develop a high fever, rash, repeated vomiting, or dehydration.  Document Released: 08/03/2004 Document Revised: 09/18/2011 Document  Reviewed: 05/11/2009  ExitCare Patient Information 2014 [employer].

## 2013-03-27 NOTE — Progress Notes (Signed)
  Subjective:    Patient ID: Danielle Holt, female    DOB: 1956-05-14, 57 y.o.   MRN: [id number]  HPI Comments: She was admitted to [facility] a few weeks ago for LGI bleed, she tells me that she received 4 units of pRBC's and that no source of bleeding was found despite upper endoscopy twice, colonoscopy, NM scan, and capsule endoscopy. She has not had anymore signs of bleeding since discharge. I have no records from that hospital stay. She does not know the names of her doctors.  Anemia Presents for follow-up visit. There has been no abdominal pain, anorexia, bruising/bleeding easily, confusion, fever, leg swelling, light-headedness, malaise/fatigue, pallor, palpitations, paresthesias, pica or weight loss. Signs of blood loss that are not present include hematemesis, hematochezia, melena, menorrhagia and vaginal bleeding.      Review of Systems  Constitutional: Negative for fever, weight loss and malaise/fatigue.  Cardiovascular: Negative for palpitations.  Gastrointestinal: Negative for abdominal pain, melena, hematochezia, anorexia and hematemesis.  Genitourinary: Negative for vaginal bleeding and menorrhagia.  Skin: Negative for pallor.  Neurological: Negative for light-headedness and paresthesias.  Hematological: Does not bruise/bleed easily.  Psychiatric/Behavioral: Negative for confusion.       Objective:   Physical Exam  Vitals reviewed. Constitutional: She is oriented to person, place, and time. She appears well-developed and well-nourished. No distress.  HENT:  Head: Normocephalic and atraumatic.  Mouth/Throat: Oropharynx is clear and moist. No oropharyngeal exudate.  Eyes: Conjunctivae are normal. Right eye exhibits no discharge. Left eye exhibits no discharge. No scleral icterus.  Neck: Normal range of motion. Neck supple. No JVD present. No tracheal deviation present. No thyromegaly present.  Cardiovascular: Normal rate, regular rhythm, normal heart sounds and intact  distal pulses.  Exam reveals no gallop and no friction rub.   No murmur heard. Pulmonary/Chest: Effort normal and breath sounds normal. No stridor. No respiratory distress. She has no wheezes. She has no rales. She exhibits no tenderness.  Abdominal: Soft. Bowel sounds are normal. She exhibits no distension and no mass. There is no tenderness. There is no rebound and no guarding.  Musculoskeletal: Normal range of motion. She exhibits no edema and no tenderness.  Lymphadenopathy:    She has no cervical adenopathy.  Neurological: She is oriented to person, place, and time.  Skin: Skin is warm and dry. No rash noted. She is not diaphoretic. No erythema. No pallor.  Psychiatric: She has a normal mood and affect. Her behavior is normal. Judgment and thought content normal.     Lab Results  Component Value Date   WBC 10.1 10/29/2012   HGB 13.0 10/29/2012   HCT 38.6 10/29/2012   PLT 312.0 10/29/2012   GLUCOSE 80 10/29/2012   CHOL 215* 10/29/2012   TRIG 116.0 10/29/2012   HDL 83.30 10/29/2012   LDLDIRECT 105.9 10/29/2012   LDLCALC 46 02/23/2012   ALT 20 10/29/2012   AST 23 10/29/2012   NA 135 10/29/2012   K 3.6 10/29/2012   CL 103 10/29/2012   CREATININE 0.9 10/29/2012   BUN 15 10/29/2012   CO2 26 10/29/2012   TSH 1.69 10/29/2012   HGBA1C 5.7 10/29/2012       Assessment & Plan:

## 2013-03-27 NOTE — Assessment & Plan Note (Signed)
Her BP is well controlled 

## 2013-03-27 NOTE — Assessment & Plan Note (Signed)
I will recheck her TSh today and will adjust her dose if needed

## 2013-03-27 NOTE — Assessment & Plan Note (Signed)
No more blood loss noted She will f/up with GI in [place] I will recheck her CBC today and will also look at her iron level

## 2013-03-27 NOTE — Addendum Note (Signed)
Addended by: Etta Grandchild on: 03/27/2013 04:25 PM   Modules accepted: Orders, Medications

## 2013-03-31 ENCOUNTER — Other Ambulatory Visit: Payer: Self-pay | Admitting: Internal Medicine

## 2013-04-01 ENCOUNTER — Other Ambulatory Visit: Payer: Self-pay | Admitting: Internal Medicine

## 2013-04-01 DIAGNOSIS — F329 Major depressive disorder, single episode, unspecified: Secondary | ICD-10-CM

## 2013-04-01 DIAGNOSIS — M5416 Radiculopathy, lumbar region: Secondary | ICD-10-CM

## 2013-04-01 DIAGNOSIS — M1710 Unilateral primary osteoarthritis, unspecified knee: Secondary | ICD-10-CM

## 2013-04-01 MED ORDER — HYDROCODONE-ACETAMINOPHEN 5-325 MG PO TABS: 1.0000 | ORAL_TABLET | Freq: Three times a day (TID) | ORAL | Status: DC | PRN

## 2013-04-01 MED ORDER — LEVOMILNACIPRAN HCL ER 40 MG PO CP24: 1.0000 | ORAL_CAPSULE | Freq: Every day | ORAL | Status: DC

## 2013-04-08 ENCOUNTER — Ambulatory Visit: Payer: PRIVATE HEALTH INSURANCE | Admitting: Internal Medicine

## 2013-04-15 ENCOUNTER — Encounter: Payer: Self-pay | Admitting: Internal Medicine

## 2013-04-16 ENCOUNTER — Other Ambulatory Visit: Payer: Self-pay | Admitting: Internal Medicine

## 2013-04-24 ENCOUNTER — Ambulatory Visit: Payer: PRIVATE HEALTH INSURANCE | Admitting: Internal Medicine

## 2013-05-06 ENCOUNTER — Encounter: Payer: Self-pay | Admitting: Internal Medicine

## 2013-05-06 ENCOUNTER — Other Ambulatory Visit: Payer: Self-pay | Admitting: Internal Medicine

## 2013-05-06 ENCOUNTER — Other Ambulatory Visit (INDEPENDENT_AMBULATORY_CARE_PROVIDER_SITE_OTHER): Payer: PRIVATE HEALTH INSURANCE

## 2013-05-06 ENCOUNTER — Ambulatory Visit (INDEPENDENT_AMBULATORY_CARE_PROVIDER_SITE_OTHER): Payer: PRIVATE HEALTH INSURANCE | Admitting: Internal Medicine

## 2013-05-06 VITALS — BP 134/98 | HR 80 | Temp 97.1°F | Resp 16 | Ht 62.0 in | Wt 226.2 lb

## 2013-05-06 DIAGNOSIS — E876 Hypokalemia: Secondary | ICD-10-CM

## 2013-05-06 DIAGNOSIS — E538 Deficiency of other specified B group vitamins: Secondary | ICD-10-CM

## 2013-05-06 DIAGNOSIS — I10 Essential (primary) hypertension: Secondary | ICD-10-CM

## 2013-05-06 DIAGNOSIS — M1710 Unilateral primary osteoarthritis, unspecified knee: Secondary | ICD-10-CM

## 2013-05-06 DIAGNOSIS — Z23 Encounter for immunization: Secondary | ICD-10-CM

## 2013-05-06 DIAGNOSIS — M5416 Radiculopathy, lumbar region: Secondary | ICD-10-CM

## 2013-05-06 DIAGNOSIS — IMO0002 Reserved for concepts with insufficient information to code with codable children: Secondary | ICD-10-CM

## 2013-05-06 LAB — CBC WITH DIFFERENTIAL/PLATELET
Basophils Relative: 0.7 % (ref 0.0–3.0)
Eosinophils Relative: 1.1 % (ref 0.0–5.0)
HCT: 34.6 % — ABNORMAL LOW (ref 36.0–46.0)
Lymphs Abs: 2.7 10*3/uL (ref 0.7–4.0)
MCV: 84.2 fl (ref 78.0–100.0)
Monocytes Absolute: 0.5 10*3/uL (ref 0.1–1.0)
RBC: 4.11 Mil/uL (ref 3.87–5.11)
WBC: 6.7 10*3/uL (ref 4.5–10.5)

## 2013-05-06 LAB — BASIC METABOLIC PANEL
BUN: 9 mg/dL (ref 6–23)
CO2: 28 mEq/L (ref 19–32)
Calcium: 9.2 mg/dL (ref 8.4–10.5)
Creatinine, Ser: 0.9 mg/dL (ref 0.4–1.2)
GFR: 83.45 mL/min (ref >60.00)
Glucose, Bld: 98 mg/dL (ref 70–99)
Potassium: 3.9 mEq/L (ref 3.5–5.1)

## 2013-05-06 MED ORDER — HYDROCODONE-ACETAMINOPHEN 5-325 MG PO TABS: 1.0000 | ORAL_TABLET | Freq: Three times a day (TID) | ORAL | Status: DC | PRN

## 2013-05-06 MED ORDER — TRIAMTERENE-HCTZ 37.5-25 MG PO TABS: 1.0000 | ORAL_TABLET | Freq: Every day | ORAL | Status: DC

## 2013-05-06 NOTE — Patient Instructions (Signed)

## 2013-05-06 NOTE — Assessment & Plan Note (Signed)
Cont norco as needed for pain 

## 2013-05-06 NOTE — Assessment & Plan Note (Signed)
She has no s/s of blood loss I will recheck her CBC today

## 2013-05-06 NOTE — Assessment & Plan Note (Signed)
I will check a random cortisol level today to see if there is concern for cortisol excess Will also recheck her K+ and Mg++ levels She will start maxzide for this as well

## 2013-05-06 NOTE — Assessment & Plan Note (Signed)
Her BP is not well controlled She was recently admitted to [facility] and her K+ was low She will stay on the ACEI but I think Maxzide is a good choice for her edema, BP, and low K+

## 2013-05-06 NOTE — Progress Notes (Signed)
  Subjective:    Patient ID: [name], female    DOB: 1956-06-08, 57 y.o.   MRN: [id number]  Hypertension This is a chronic problem. The current episode started more than 1 year ago. The problem is unchanged. The problem is uncontrolled. Associated symptoms include peripheral edema. Pertinent negatives include no anxiety, blurred vision, chest pain, headaches, malaise/fatigue, neck pain, orthopnea, palpitations, PND, shortness of breath or sweats. Past treatments include ACE inhibitors. The current treatment provides mild improvement. Compliance problems include exercise and diet.       Review of Systems  Constitutional: Negative.  Negative for fever, chills, malaise/fatigue, diaphoresis, appetite change and fatigue.  HENT: Negative.   Eyes: Negative.  Negative for blurred vision.  Respiratory: Negative.  Negative for cough, chest tightness, shortness of breath, wheezing and stridor.   Cardiovascular: Negative.  Negative for chest pain, palpitations, orthopnea, leg swelling and PND.  Gastrointestinal: Negative.  Negative for nausea, vomiting, abdominal pain, diarrhea, constipation and blood in stool.  Endocrine: Negative.   Genitourinary: Negative.   Musculoskeletal: Positive for arthralgias (knees) and back pain. Negative for gait problem, joint swelling, myalgias, neck pain and neck stiffness.  Skin: Negative.   Allergic/Immunologic: Negative.   Neurological: Negative.  Negative for dizziness, tremors, weakness, light-headedness, numbness and headaches.  Hematological: Negative.  Negative for adenopathy. Does not bruise/bleed easily.  Psychiatric/Behavioral: Negative.        Objective:   Physical Exam  Vitals reviewed. Constitutional: She is oriented to person, place, and time. She appears well-developed and well-nourished. No distress.  HENT:  Head: Normocephalic and atraumatic.  Mouth/Throat: Oropharynx is clear and moist. No oropharyngeal exudate.  Eyes: Conjunctivae  are normal. Right eye exhibits no discharge. Left eye exhibits no discharge. No scleral icterus.  Neck: Normal range of motion. Neck supple. No JVD present. No tracheal deviation present. No thyromegaly present.  Cardiovascular: Normal rate, regular rhythm, normal heart sounds and intact distal pulses.  Exam reveals no gallop and no friction rub.   No murmur heard. Pulmonary/Chest: Effort normal and breath sounds normal. No stridor. No respiratory distress. She has no wheezes. She has no rales. She exhibits no tenderness.  Abdominal: Soft. Bowel sounds are normal. She exhibits no distension and no mass. There is no tenderness. There is no rebound and no guarding.  Musculoskeletal: Normal range of motion. She exhibits edema (2+ pitting edema in BLE). She exhibits no tenderness.  Lymphadenopathy:    She has no cervical adenopathy.  Neurological: She is oriented to person, place, and time.  Skin: Skin is warm and dry. No rash noted. She is not diaphoretic. No erythema. No pallor.  Psychiatric: She has a normal mood and affect. Her behavior is normal. Judgment and thought content normal.     Lab Results  Component Value Date   WBC 6.9 03/27/2013   HGB 10.1* 03/27/2013   HCT 29.9* 03/27/2013   PLT 348.0 03/27/2013   GLUCOSE 94 03/27/2013   CHOL 215* 10/29/2012   TRIG 116.0 10/29/2012   HDL 83.30 10/29/2012   LDLDIRECT 105.9 10/29/2012   LDLCALC 46 02/23/2012   ALT 20 10/29/2012   AST 23 10/29/2012   NA 138 03/27/2013   K 3.5 03/27/2013   CL 106 03/27/2013   CREATININE 0.8 03/27/2013   BUN 11 03/27/2013   CO2 27 03/27/2013   TSH 0.60 03/27/2013   HGBA1C 5.7 10/29/2012       Assessment & Plan:

## 2013-05-08 ENCOUNTER — Inpatient Hospital Stay: Payer: PRIVATE HEALTH INSURANCE | Admitting: Internal Medicine

## 2013-05-11 ENCOUNTER — Encounter: Payer: Self-pay | Admitting: Internal Medicine

## 2013-05-12 ENCOUNTER — Other Ambulatory Visit: Payer: Self-pay | Admitting: Internal Medicine

## 2013-05-12 DIAGNOSIS — I10 Essential (primary) hypertension: Secondary | ICD-10-CM

## 2013-05-12 MED ORDER — LOSARTAN POTASSIUM 50 MG PO TABS: 50.0000 mg | ORAL_TABLET | Freq: Every day | ORAL | Status: DC

## 2013-05-14 ENCOUNTER — Telehealth: Payer: Self-pay | Admitting: *Deleted

## 2013-05-14 NOTE — Telephone Encounter (Signed)
Pt called states the Losartan Rx is causing fatigue, abd pain, and SOB.  Please advise

## 2013-05-14 NOTE — Telephone Encounter (Signed)
Pt states she has stopped the Losartan due to the symptoms described below.  Requesting what she should due next.  Please advise

## 2013-05-14 NOTE — Telephone Encounter (Signed)
Stay off of it and let me know how she does

## 2013-05-14 NOTE — Telephone Encounter (Signed)
That does not make sense to me 

## 2013-05-15 NOTE — Telephone Encounter (Signed)
Spoke with pt advised of MDs message 

## 2013-05-27 ENCOUNTER — Ambulatory Visit: Payer: PRIVATE HEALTH INSURANCE | Admitting: Internal Medicine

## 2013-06-10 ENCOUNTER — Other Ambulatory Visit: Payer: Self-pay | Admitting: Internal Medicine

## 2013-06-12 ENCOUNTER — Ambulatory Visit (INDEPENDENT_AMBULATORY_CARE_PROVIDER_SITE_OTHER): Payer: PRIVATE HEALTH INSURANCE | Admitting: Internal Medicine

## 2013-06-12 ENCOUNTER — Encounter: Payer: Self-pay | Admitting: Internal Medicine

## 2013-06-12 VITALS — BP 120/80 | HR 99 | Temp 99.3°F | Resp 16 | Wt 230.4 lb

## 2013-06-12 DIAGNOSIS — M940 Chondrocostal junction syndrome [Tietze]: Secondary | ICD-10-CM | POA: Insufficient documentation

## 2013-06-12 DIAGNOSIS — I10 Essential (primary) hypertension: Secondary | ICD-10-CM

## 2013-06-12 MED ORDER — AMLODIPINE BESYLATE 5 MG PO TABS: 5.0000 mg | ORAL_TABLET | Freq: Every day | ORAL | Status: DC

## 2013-06-12 MED ORDER — NORTRIPTYLINE HCL 10 MG PO CAPS: 10.0000 mg | ORAL_CAPSULE | Freq: Every day | ORAL | Status: DC

## 2013-06-12 NOTE — Patient Instructions (Signed)
Costochondritis Costochondritis (Tietze syndrome), or costochondral separation, is a swelling and irritation (inflammation) of the tissue (cartilage) that connects your ribs with your breastbone (sternum). It may occur on its own (spontaneously), through damage caused by an accident (trauma), or simply from coughing or minor exercise. It may take up to 6 weeks to get better and longer if you are unable to be conservative in your activities. HOME CARE INSTRUCTIONS   Avoid exhausting physical activity. Try not to strain your ribs during normal activity. This would include any activities using chest, belly (abdominal), and side muscles, especially if heavy weights are used.  Use ice for 15-20 minutes per hour while awake for the first 2 days. Place the ice in a plastic bag, and place a towel between the bag of ice and your skin.  Only take over-the-counter or prescription medicines for pain, discomfort, or fever as directed by your caregiver. SEEK IMMEDIATE MEDICAL CARE IF:   Your pain increases or you are very uncomfortable.  You have a fever.  You develop difficulty with your breathing.  You cough up blood.  You develop worse chest pains, shortness of breath, sweating, or vomiting.  You develop new, unexplained problems (symptoms). MAKE SURE YOU:   Understand these instructions.  Will watch your condition.  Will get help right away if you are not doing well or get worse. Document Released: 04/05/2005 Document Revised: 09/18/2011 Document Reviewed: 01/28/2013 Exit[employer] Patient Information 2014 [employer].  

## 2013-06-12 NOTE — Progress Notes (Signed)
Pre-visit discussion using our clinic review tool. No additional management support is needed unless otherwise documented below in the visit note.  

## 2013-06-12 NOTE — Progress Notes (Signed)
Subjective:    Patient ID: Danielle Holt, female    DOB: December 28, 1955, 57 y.o.   MRN: [id number]  Chest Pain  This is a recurrent (for 2-3 months) problem. The current episode started more than 1 month ago. The onset quality is gradual. The problem occurs intermittently. The problem has been unchanged. The pain is present in the substernal region. The pain is at a severity of 2/10. The pain is mild. Quality: sharp, stabbing, grabbing and always at rest. The pain does not radiate. Pertinent negatives include no abdominal pain, back pain, claudication, cough, diaphoresis, dizziness, exertional chest pressure, fever, headaches, hemoptysis, irregular heartbeat, leg pain, lower extremity edema, malaise/fatigue, nausea, near-syncope, numbness, orthopnea, palpitations, PND, shortness of breath, sputum production, syncope, vomiting or weakness. The pain is aggravated by nothing. She has tried acetaminophen and NSAIDs for the symptoms. The treatment provided mild relief. Prior diagnostic workup includes persantine thallium and chest x-ray (she tells me that she has been admitted 2x to [facility] [address] and that extensive testing including a MPI were all normal).      Review of Systems  Constitutional: Negative.  Negative for fever, chills, malaise/fatigue, diaphoresis, appetite change and fatigue.  HENT: Negative.  Negative for trouble swallowing and voice change.   Eyes: Negative.   Respiratory: Negative.  Negative for apnea, cough, hemoptysis, sputum production, choking, chest tightness, shortness of breath, wheezing and stridor.   Cardiovascular: Positive for chest pain. Negative for palpitations, orthopnea, claudication, leg swelling, syncope, PND and near-syncope.  Gastrointestinal: Negative.  Negative for nausea, vomiting, abdominal pain, diarrhea, constipation and blood in stool.  Endocrine: Negative.   Genitourinary: Negative.   Musculoskeletal: Negative.  Negative for back pain.  Skin: Negative.     Allergic/Immunologic: Negative.   Neurological: Negative.  Negative for dizziness, syncope, weakness, light-headedness, numbness and headaches.  Hematological: Negative.  Negative for adenopathy. Does not bruise/bleed easily.  Psychiatric/Behavioral: Negative.        Objective:   Physical Exam  Vitals reviewed. Constitutional: She is oriented to person, place, and time. She appears well-developed and well-nourished. No distress.  HENT:  Head: Normocephalic and atraumatic.  Mouth/Throat: Oropharynx is clear and moist. No oropharyngeal exudate.  Eyes: Conjunctivae are normal. Right eye exhibits no discharge. Left eye exhibits no discharge. No scleral icterus.  Neck: Normal range of motion. Neck supple. No JVD present. No tracheal deviation present. No thyromegaly present.  Cardiovascular: Normal rate, regular rhythm, normal heart sounds and intact distal pulses.  Exam reveals no gallop and no friction rub.   No murmur heard. Pulmonary/Chest: Effort normal and breath sounds normal. No accessory muscle usage or stridor. Not tachypneic. No respiratory distress. She has no decreased breath sounds. She has no wheezes. She has no rhonchi. She has no rales. Chest wall is not dull to percussion. She exhibits tenderness and bony tenderness. She exhibits no mass, no laceration, no crepitus, no edema, no deformity, no swelling and no retraction. Right breast exhibits no inverted nipple, no mass, no skin change and no tenderness. Left breast exhibits no inverted nipple, no mass, no nipple discharge, no skin change and no tenderness. Breasts are symmetrical.    Abdominal: Soft. Bowel sounds are normal. She exhibits no distension and no mass. There is no tenderness. There is no rebound and no guarding.  Musculoskeletal: Normal range of motion. She exhibits no edema and no tenderness.  Lymphadenopathy:    She has no cervical adenopathy.  Neurological: She is oriented to person, place, and time.  Skin:  Skin is warm and dry. No rash noted. She is not diaphoretic. No erythema. No pallor.  Psychiatric: She has a normal mood and affect. Her behavior is normal. Judgment and thought content normal.     Lab Results  Component Value Date   WBC 6.7 05/06/2013   HGB 11.6* 05/06/2013   HCT 34.6* 05/06/2013   PLT 291.0 05/06/2013   GLUCOSE 98 05/06/2013   CHOL 215* 10/29/2012   TRIG 116.0 10/29/2012   HDL 83.30 10/29/2012   LDLDIRECT 105.9 10/29/2012   LDLCALC 46 02/23/2012   ALT 20 10/29/2012   AST 23 10/29/2012   NA 142 05/06/2013   K 3.9 05/06/2013   CL 106 05/06/2013   CREATININE 0.9 05/06/2013   BUN 9 05/06/2013   CO2 28 05/06/2013   TSH 0.60 03/27/2013   HGBA1C 5.7 10/29/2012       Assessment & Plan:

## 2013-06-13 ENCOUNTER — Encounter: Payer: Self-pay | Admitting: Internal Medicine

## 2013-06-13 NOTE — Assessment & Plan Note (Signed)
Her pain does not sound cardiac or pulm - I do not have any records from [facility] so I have asked her to call them and have the records forwarded to me For now, I will treat for costochondritis with nortriptyline, she will continue the other meds as well

## 2013-06-13 NOTE — Assessment & Plan Note (Signed)
Her BP is well controlled 

## 2013-06-18 ENCOUNTER — Other Ambulatory Visit: Payer: Self-pay | Admitting: Internal Medicine

## 2013-07-08 ENCOUNTER — Ambulatory Visit: Payer: PRIVATE HEALTH INSURANCE | Admitting: Internal Medicine

## 2013-07-29 ENCOUNTER — Other Ambulatory Visit: Payer: Self-pay | Admitting: Internal Medicine

## 2013-09-10 ENCOUNTER — Other Ambulatory Visit: Payer: Self-pay | Admitting: Internal Medicine

## 2013-10-27 ENCOUNTER — Other Ambulatory Visit: Payer: Self-pay | Admitting: Internal Medicine

## 2013-11-26 ENCOUNTER — Telehealth: Payer: Self-pay | Admitting: Internal Medicine

## 2013-11-26 NOTE — Telephone Encounter (Signed)
[facility] Maudry MayhewJone that Mrs. Danielle Holt was release from [facility] on 11/24/13.

## 2014-01-06 ENCOUNTER — Other Ambulatory Visit: Payer: Self-pay | Admitting: Internal Medicine

## 2014-04-20 ENCOUNTER — Telehealth: Payer: Self-pay

## 2014-04-20 NOTE — Telephone Encounter (Signed)
Pt states that she has not had her eye exam, but plans to schedule it.

## 2014-06-23 ENCOUNTER — Encounter: Payer: Self-pay | Admitting: Internal Medicine

## 2014-06-23 ENCOUNTER — Ambulatory Visit (INDEPENDENT_AMBULATORY_CARE_PROVIDER_SITE_OTHER): Payer: PRIVATE HEALTH INSURANCE | Admitting: Internal Medicine

## 2014-06-23 VITALS — BP 126/90 | HR 83 | Temp 98.0°F | Ht 62.0 in | Wt 246.0 lb

## 2014-06-23 DIAGNOSIS — I10 Essential (primary) hypertension: Secondary | ICD-10-CM

## 2014-06-23 DIAGNOSIS — M5416 Radiculopathy, lumbar region: Secondary | ICD-10-CM

## 2014-06-23 DIAGNOSIS — J301 Allergic rhinitis due to pollen: Secondary | ICD-10-CM

## 2014-06-23 DIAGNOSIS — M170 Bilateral primary osteoarthritis of knee: Secondary | ICD-10-CM

## 2014-06-23 DIAGNOSIS — D509 Iron deficiency anemia, unspecified: Secondary | ICD-10-CM

## 2014-06-23 DIAGNOSIS — D519 Vitamin B12 deficiency anemia, unspecified: Secondary | ICD-10-CM

## 2014-06-23 DIAGNOSIS — E038 Other specified hypothyroidism: Secondary | ICD-10-CM

## 2014-06-23 DIAGNOSIS — E785 Hyperlipidemia, unspecified: Secondary | ICD-10-CM

## 2014-06-23 DIAGNOSIS — J4530 Mild persistent asthma, uncomplicated: Secondary | ICD-10-CM

## 2014-06-23 MED ORDER — HYDROCODONE-ACETAMINOPHEN 5-325 MG PO TABS: 1.0000 | ORAL_TABLET | Freq: Three times a day (TID) | ORAL | Status: DC | PRN

## 2014-06-23 MED ORDER — CYANOCOBALAMIN 1000 MCG/ML IJ SOLN
1000.0000 ug | Freq: Once | INTRAMUSCULAR | Status: AC
  Administered 2014-06-23: 1000 ug via INTRAMUSCULAR

## 2014-06-23 NOTE — Assessment & Plan Note (Signed)
She will cont B12 injections Will recheck her CBC and vitamin levels today

## 2014-06-23 NOTE — Assessment & Plan Note (Signed)
She thinks she has been exposed to black mold Will refer to allergy for further evaluation

## 2014-06-23 NOTE — Assessment & Plan Note (Signed)
Her BP is well controlled I will monitor her lytes and renal function 

## 2014-06-23 NOTE — Patient Instructions (Signed)
Hypothyroidism The thyroid is a large gland located in the lower front of your neck. The thyroid gland helps control metabolism. Metabolism is how your body handles food. It controls metabolism with the hormone thyroxine. When this gland is underactive (hypothyroid), it produces too little hormone.  CAUSES These include:   Absence or destruction of thyroid tissue.  Goiter due to iodine deficiency.  Goiter due to medications.  Congenital defects (since birth).  Problems with the pituitary. This causes a lack of TSH (thyroid stimulating hormone). This hormone tells the thyroid to turn out more hormone. SYMPTOMS  Lethargy (feeling as though you have no energy)  Cold intolerance  Weight gain (in spite of normal food intake)  Dry skin  Coarse hair  Menstrual irregularity (if severe, may lead to infertility)  Slowing of thought processes Cardiac problems are also caused by insufficient amounts of thyroid hormone. Hypothyroidism in the newborn is cretinism, and is an extreme form. It is important that this form be treated adequately and immediately or it will lead rapidly to retarded physical and mental development. DIAGNOSIS  To prove hypothyroidism, your caregiver may do blood tests and ultrasound tests. Sometimes the signs are hidden. It may be necessary for your caregiver to watch this illness with blood tests either before or after diagnosis and treatment. TREATMENT  Low levels of thyroid hormone are increased by using synthetic thyroid hormone. This is a safe, effective treatment. It usually takes about four weeks to gain the full effects of the medication. After you have the full effect of the medication, it will generally take another four weeks for problems to leave. Your caregiver may start you on low doses. If you have had heart problems the dose may be gradually increased. It is generally not an emergency to get rapidly to normal. HOME CARE INSTRUCTIONS   Take your  medications as your caregiver suggests. Let your caregiver know of any medications you are taking or start taking. Your caregiver will help you with dosage schedules.  As your condition improves, your dosage needs may increase. It will be necessary to have continuing blood tests as suggested by your caregiver.  Report all suspected medication side effects to your caregiver. SEEK MEDICAL CARE IF: Seek medical care if you develop:  Sweating.  Tremulousness (tremors).  Anxiety.  Rapid weight loss.  Heat intolerance.  Emotional swings.  Diarrhea.  Weakness. SEEK IMMEDIATE MEDICAL CARE IF:  You develop chest pain, an irregular heart beat (palpitations), or a rapid heart beat. MAKE SURE YOU:   Understand these instructions.  Will watch your condition.  Will get help right away if you are not doing well or get worse. Document Released: 06/26/2005 Document Revised: 09/18/2011 Document Reviewed: 02/14/2008 ExitCare Patient Information 2015 [employer]. This information is not intended to replace advice given to you by your health care provider. Make sure you discuss any questions you have with your health care provider.  

## 2014-06-23 NOTE — Progress Notes (Signed)
   Subjective:    Patient ID: Danielle Holt, female    DOB: 08/08/1955, 58 y.o.   MRN: [id number]  Thyroid Problem Presents for follow-up visit. Symptoms include depressed mood, fatigue and weight gain. Patient reports no anxiety, cold intolerance, constipation, diaphoresis, diarrhea, dry skin, hair loss, heat intolerance, hoarse voice, leg swelling, nail problem, palpitations, tremors, visual change or weight loss. The symptoms have been stable. Past treatments include levothyroxine. The treatment provided moderate relief. Her past medical history is significant for obesity.      Review of Systems  Constitutional: Positive for weight gain and fatigue. Negative for fever, chills, weight loss and diaphoresis.  HENT: Positive for congestion, postnasal drip and rhinorrhea. Negative for hoarse voice, sinus pressure, sore throat and trouble swallowing.   Eyes: Negative.   Respiratory: Negative.  Negative for cough, choking, chest tightness, shortness of breath and stridor.   Cardiovascular: Negative.  Negative for chest pain, palpitations and leg swelling.  Gastrointestinal: Negative.  Negative for nausea, vomiting, abdominal pain, diarrhea, constipation and rectal pain.  Endocrine: Negative.  Negative for cold intolerance and heat intolerance.  Genitourinary: Negative.   Musculoskeletal: Positive for back pain and arthralgias. Negative for myalgias, gait problem, neck pain and neck stiffness.  Skin: Negative.  Negative for rash.  Allergic/Immunologic: Negative.   Neurological: Negative.  Negative for tremors.  Hematological: Negative.  Negative for adenopathy. Does not bruise/bleed easily.  Psychiatric/Behavioral: Negative.  The patient is not nervous/anxious.        Objective:   Physical Exam  Constitutional: She is oriented to person, place, and time. She appears well-developed and well-nourished.  Non-toxic appearance. She does not have a sickly appearance. She does not appear ill. No  distress.  HENT:  Head: Normocephalic and atraumatic.  Mouth/Throat: Oropharynx is clear and moist. No oropharyngeal exudate.  Eyes: Conjunctivae are normal. Right eye exhibits no discharge. Left eye exhibits no discharge. No scleral icterus.  Neck: Normal range of motion. Neck supple. No JVD present. No tracheal deviation present. No thyromegaly present.  Cardiovascular: Normal rate, regular rhythm, normal heart sounds and intact distal pulses.  Exam reveals no gallop and no friction rub.   No murmur heard. Pulmonary/Chest: Effort normal and breath sounds normal. No stridor. No respiratory distress. She has no wheezes. She has no rales. She exhibits no tenderness.  Abdominal: Soft. Bowel sounds are normal. She exhibits no distension and no mass. There is no tenderness. There is no rebound and no guarding.  Musculoskeletal: Normal range of motion. She exhibits no edema or tenderness.  Lymphadenopathy:    She has no cervical adenopathy.  Neurological: She is oriented to person, place, and time.  Skin: Skin is warm and dry. No rash noted. She is not diaphoretic. No erythema. No pallor.  Vitals reviewed.    Lab Results  Component Value Date   WBC 6.7 05/06/2013   HGB 11.6* 05/06/2013   HCT 34.6* 05/06/2013   PLT 291.0 05/06/2013   GLUCOSE 98 05/06/2013   CHOL 215* 10/29/2012   TRIG 116.0 10/29/2012   HDL 83.30 10/29/2012   LDLDIRECT 105.9 10/29/2012   LDLCALC 46 02/23/2012   ALT 20 10/29/2012   AST 23 10/29/2012   NA 142 05/06/2013   K 3.9 05/06/2013   CL 106 05/06/2013   CREATININE 0.9 05/06/2013   BUN 9 05/06/2013   CO2 28 05/06/2013   TSH 0.60 03/27/2013   HGBA1C 5.7 10/29/2012       Assessment & Plan:

## 2014-06-23 NOTE — Assessment & Plan Note (Signed)
She will cont norco as needed for pain 

## 2014-06-23 NOTE — Assessment & Plan Note (Signed)
I will check her TSH and will adjust her dose if needed 

## 2014-06-24 ENCOUNTER — Telehealth: Payer: Self-pay | Admitting: Internal Medicine

## 2014-06-24 NOTE — Telephone Encounter (Signed)
emmi emailed °

## 2014-07-14 DIAGNOSIS — K635 Polyp of colon: Secondary | ICD-10-CM

## 2014-07-14 HISTORY — DX: Polyp of colon: K63.5

## 2014-07-15 ENCOUNTER — Ambulatory Visit: Payer: PRIVATE HEALTH INSURANCE | Admitting: Family

## 2014-07-22 ENCOUNTER — Encounter: Payer: Self-pay | Admitting: Internal Medicine

## 2014-07-22 ENCOUNTER — Other Ambulatory Visit (INDEPENDENT_AMBULATORY_CARE_PROVIDER_SITE_OTHER): Payer: 59

## 2014-07-22 ENCOUNTER — Ambulatory Visit (INDEPENDENT_AMBULATORY_CARE_PROVIDER_SITE_OTHER): Payer: 59 | Admitting: Internal Medicine

## 2014-07-22 VITALS — BP 118/80 | HR 84 | Temp 98.3°F | Resp 16 | Ht 62.0 in | Wt 255.8 lb

## 2014-07-22 DIAGNOSIS — D519 Vitamin B12 deficiency anemia, unspecified: Secondary | ICD-10-CM | POA: Diagnosis not present

## 2014-07-22 DIAGNOSIS — E038 Other specified hypothyroidism: Secondary | ICD-10-CM

## 2014-07-22 DIAGNOSIS — I10 Essential (primary) hypertension: Secondary | ICD-10-CM

## 2014-07-22 DIAGNOSIS — E785 Hyperlipidemia, unspecified: Secondary | ICD-10-CM | POA: Diagnosis not present

## 2014-07-22 DIAGNOSIS — M170 Bilateral primary osteoarthritis of knee: Secondary | ICD-10-CM

## 2014-07-22 DIAGNOSIS — M5416 Radiculopathy, lumbar region: Secondary | ICD-10-CM

## 2014-07-22 LAB — CBC WITH DIFFERENTIAL/PLATELET
BASOS ABS: 0 10*3/uL (ref 0.0–0.1)
Basophils Relative: 0.7 % (ref 0.0–3.0)
Eosinophils Absolute: 0.1 10*3/uL (ref 0.0–0.7)
Eosinophils Relative: 0.9 % (ref 0.0–5.0)
HEMATOCRIT: 30.5 % — AB (ref 36.0–46.0)
Hemoglobin: 10.1 g/dL — ABNORMAL LOW (ref 12.0–15.0)
LYMPHS ABS: 2.6 10*3/uL (ref 0.7–4.0)
Lymphocytes Relative: 36.8 % (ref 12.0–46.0)
MCHC: 33 g/dL (ref 30.0–36.0)
MCV: 90.3 fl (ref 78.0–100.0)
MONO ABS: 0.5 10*3/uL (ref 0.1–1.0)
Monocytes Relative: 7.3 % (ref 3.0–12.0)
Neutro Abs: 3.8 10*3/uL (ref 1.4–7.7)
Neutrophils Relative %: 54.3 % (ref 43.0–77.0)
PLATELETS: 353 10*3/uL (ref 150.0–400.0)
RBC: 3.38 Mil/uL — ABNORMAL LOW (ref 3.87–5.11)
RDW: 15.2 % (ref 11.5–15.5)
WBC: 7.1 10*3/uL (ref 4.0–10.5)

## 2014-07-22 LAB — COMPREHENSIVE METABOLIC PANEL
ALT: 9 U/L (ref 0–35)
AST: 14 U/L (ref 0–37)
Albumin: 3.5 g/dL (ref 3.5–5.2)
Alkaline Phosphatase: 80 U/L (ref 39–117)
BILIRUBIN TOTAL: 0.2 mg/dL (ref 0.2–1.2)
BUN: 14 mg/dL (ref 6–23)
CALCIUM: 9 mg/dL (ref 8.4–10.5)
CHLORIDE: 108 meq/L (ref 96–112)
CO2: 25 mEq/L (ref 19–32)
Creatinine, Ser: 0.87 mg/dL (ref 0.40–1.20)
GFR: 86.4 mL/min (ref >60.00)
GLUCOSE: 99 mg/dL (ref 70–99)
Potassium: 3.6 mEq/L (ref 3.5–5.1)
Sodium: 141 mEq/L (ref 135–145)
Total Protein: 7 g/dL (ref 6.0–8.3)

## 2014-07-22 LAB — IBC PANEL
Iron: 48 ug/dL (ref 42–145)
Saturation Ratios: 16 % — ABNORMAL LOW (ref 20.0–50.0)
Transferrin: 214 mg/dL (ref 212.0–360.0)

## 2014-07-22 LAB — VITAMIN B12

## 2014-07-22 LAB — FERRITIN: Ferritin: 112.3 ng/mL (ref 10.0–291.0)

## 2014-07-22 LAB — FOLATE: Folate: 7.6 ng/mL (ref >5.9)

## 2014-07-22 LAB — TSH: TSH: 1.46 u[IU]/mL (ref 0.35–4.50)

## 2014-07-22 MED ORDER — CYANOCOBALAMIN 1000 MCG/ML IJ SOLN
1000.0000 ug | Freq: Once | INTRAMUSCULAR | Status: AC
  Administered 2014-07-22: 1000 ug via INTRAMUSCULAR

## 2014-07-22 MED ORDER — OXYCODONE-ACETAMINOPHEN 7.5-325 MG PO TABS: 1.0000 | ORAL_TABLET | ORAL | Status: DC | PRN

## 2014-07-22 NOTE — Progress Notes (Signed)
Pre visit review using our clinic review tool, if applicable. No additional management support is needed unless otherwise documented below in the visit note. 

## 2014-07-22 NOTE — Patient Instructions (Signed)

## 2014-07-22 NOTE — Assessment & Plan Note (Signed)
Her BP is well controlled Will check her lytes and renal function today 

## 2014-07-22 NOTE — Assessment & Plan Note (Signed)
I will recheck her TSH level today 

## 2014-07-22 NOTE — Assessment & Plan Note (Addendum)
She has tried to use a walker but is still in a lot of pain, will apply for a power wheelchair to improve mobility and to help her complete her ADL's such as eating, bathing, feeding, toileting, and to help remain independent in her home. She is both physically and mentally capable of operating all controls on a PWC. Will offer percocet for better pain relief.

## 2014-07-22 NOTE — Progress Notes (Addendum)
Subjective:    Patient ID: Danielle Holt, female    DOB: 04/16/1956, 59 y.o.   MRN: [id number]  HPI Comments: She is in a lot of pain and dose not think norco is controlling the pain very well, she wants something better. She has been using a walker to help her get around but even this is not helping and she wants to apply for a power wheelchair - she is here for a mobility assessment.  Pt needs PWC to improve mobility and to complete her ADL's timely, such as bathing, feeding, toileting, and to remain independent within her home. Pt is both physically and mentally capable of operating all controls on a PWC.  She has tried a scooter and manual wheelchair but these cause her too much pain for her to operate.  Arthritis Presents for follow-up visit. She complains of pain and joint warmth. She reports no stiffness or joint swelling. Affected locations include the left knee and right knee. Her pain is at a severity of 6/10. Associated symptoms include pain at night and pain while resting. Pertinent negatives include no diarrhea, dry eyes, dry mouth, dysuria, fatigue, fever, rash, Raynaud's syndrome, uveitis or weight loss. Her past medical history is significant for chronic back pain and osteoarthritis. There is no history of lupus, psoriasis or rheumatoid arthritis.  Past treatments include an opioid. The treatment provided mild relief. Factors aggravating her arthritis include activity and climbing stairs. Compliance with prior treatments has been good.      Review of Systems  Constitutional: Negative for fever, chills, weight loss, diaphoresis, appetite change and fatigue.  HENT: Negative.   Eyes: Negative.   Respiratory: Negative.  Negative for cough, choking, chest tightness, shortness of breath and stridor.   Cardiovascular: Negative.  Negative for chest pain, palpitations and leg swelling.  Gastrointestinal: Negative.  Negative for nausea, vomiting, abdominal pain, diarrhea,  constipation and blood in stool.  Endocrine: Negative.   Genitourinary: Negative.  Negative for dysuria.  Musculoskeletal: Positive for back pain, arthralgias and arthritis. Negative for myalgias, joint swelling, gait problem, stiffness, neck pain and neck stiffness.  Skin: Negative.  Negative for rash.  Allergic/Immunologic: Negative.   Neurological: Positive for weakness and numbness (both legs). Negative for dizziness and light-headedness.  Hematological: Negative.  Negative for adenopathy. Does not bruise/bleed easily.  Psychiatric/Behavioral: Negative.        Objective:   Physical Exam  Constitutional: She is oriented to person, place, and time. She appears well-developed and well-nourished. No distress.  HENT:  Head: Normocephalic and atraumatic.  Mouth/Throat: Oropharynx is clear and moist. No oropharyngeal exudate.  Eyes: Conjunctivae are normal. Right eye exhibits no discharge. Left eye exhibits no discharge. No scleral icterus.  Neck: Normal range of motion. Neck supple. No JVD present. No tracheal deviation present. No thyromegaly present.  Cardiovascular: Normal rate, regular rhythm, normal heart sounds and intact distal pulses.  Exam reveals no gallop and no friction rub.   No murmur heard. Pulmonary/Chest: Effort normal and breath sounds normal. No stridor. No respiratory distress. She has no wheezes. She has no rales. She exhibits no tenderness.  Abdominal: Soft. Bowel sounds are normal. She exhibits no distension and no mass. There is no tenderness. There is no rebound and no guarding.  Musculoskeletal: Normal range of motion. She exhibits no edema or tenderness.  Lymphadenopathy:    She has no cervical adenopathy.  Neurological: She is alert and oriented to person, place, and time. She displays abnormal reflex. She displays no  atrophy and no tremor. No cranial nerve deficit or sensory deficit. She exhibits abnormal muscle tone. She displays a negative Romberg sign. She  displays no seizure activity. Coordination and gait normal.  Reflex Scores:      Tricep reflexes are 1+ on the right side and 1+ on the left side.      Bicep reflexes are 1+ on the right side and 1+ on the left side.      Brachioradialis reflexes are 1+ on the right side and 1+ on the left side.      Patellar reflexes are 0 on the right side and 0 on the left side.      Achilles reflexes are 0 on the right side and 0 on the left side. Upper body strength 2/5 Lower body strength 1/5  Skin: Skin is warm and dry. No rash noted. She is not diaphoretic. No erythema. No pallor.  Psychiatric: She has a normal mood and affect. Her behavior is normal. Judgment and thought content normal.  Vitals reviewed.   Lab Results  Component Value Date   WBC 6.7 05/06/2013   HGB 11.6* 05/06/2013   HCT 34.6* 05/06/2013   PLT 291.0 05/06/2013   GLUCOSE 98 05/06/2013   CHOL 215* 10/29/2012   TRIG 116.0 10/29/2012   HDL 83.30 10/29/2012   LDLDIRECT 105.9 10/29/2012   LDLCALC 46 02/23/2012   ALT 20 10/29/2012   AST 23 10/29/2012   NA 142 05/06/2013   K 3.9 05/06/2013   CL 106 05/06/2013   CREATININE 0.9 05/06/2013   BUN 9 05/06/2013   CO2 28 05/06/2013   TSH 0.60 03/27/2013   HGBA1C 5.7 10/29/2012        Assessment & Plan:

## 2014-07-22 NOTE — Assessment & Plan Note (Signed)
Will try percocet for better pain relief She will apply for a PWC

## 2014-08-03 ENCOUNTER — Ambulatory Visit: Payer: 59 | Admitting: Internal Medicine

## 2014-08-03 ENCOUNTER — Encounter: Payer: Self-pay | Admitting: Internal Medicine

## 2014-08-06 ENCOUNTER — Ambulatory Visit: Payer: 59 | Admitting: Internal Medicine

## 2014-08-14 ENCOUNTER — Encounter: Payer: Self-pay | Admitting: Internal Medicine

## 2014-08-14 ENCOUNTER — Telehealth: Payer: Self-pay | Admitting: Internal Medicine

## 2014-08-14 NOTE — Telephone Encounter (Signed)
Had questions in regards to [employer] for power wheel chair for patients.  States notes sent to her do not reflect this.

## 2014-08-14 NOTE — Telephone Encounter (Signed)
Pt call back and stated that Dr. Yetta BarreJones got the paper work for her for wheel chair, pt request for him to fill out the 1st sheet of that paper work and fax it ASAP.

## 2014-08-17 NOTE — Telephone Encounter (Signed)
I have received the faxed information needed to fulfill this request.

## 2014-08-21 ENCOUNTER — Institutional Professional Consult (permissible substitution): Payer: PRIVATE HEALTH INSURANCE | Admitting: Internal Medicine

## 2014-08-24 ENCOUNTER — Telehealth: Payer: Self-pay | Admitting: Internal Medicine

## 2014-08-24 NOTE — Telephone Encounter (Signed)
Determination on PA for wheel chair for patient was denied.  B/c patient has no out of networks benefits.  [employer] suggest to go with [employer] at [phone] (in network).  Is requesting call back by tomorrow by 3pm if provider would like a peer discussion.  Ref# [id number]

## 2014-08-25 NOTE — Telephone Encounter (Signed)
Patient notified and given information for new company to call.

## 2014-09-02 ENCOUNTER — Other Ambulatory Visit (INDEPENDENT_AMBULATORY_CARE_PROVIDER_SITE_OTHER): Payer: 59

## 2014-09-02 ENCOUNTER — Ambulatory Visit (INDEPENDENT_AMBULATORY_CARE_PROVIDER_SITE_OTHER): Payer: 59 | Admitting: Internal Medicine

## 2014-09-02 ENCOUNTER — Encounter: Payer: Self-pay | Admitting: Internal Medicine

## 2014-09-02 VITALS — BP 118/80 | HR 78 | Temp 98.5°F | Ht 62.0 in | Wt 246.0 lb

## 2014-09-02 DIAGNOSIS — M5416 Radiculopathy, lumbar region: Secondary | ICD-10-CM

## 2014-09-02 DIAGNOSIS — R209 Unspecified disturbances of skin sensation: Secondary | ICD-10-CM

## 2014-09-02 DIAGNOSIS — E038 Other specified hypothyroidism: Secondary | ICD-10-CM

## 2014-09-02 DIAGNOSIS — E538 Deficiency of other specified B group vitamins: Secondary | ICD-10-CM

## 2014-09-02 DIAGNOSIS — I10 Essential (primary) hypertension: Secondary | ICD-10-CM

## 2014-09-02 DIAGNOSIS — M170 Bilateral primary osteoarthritis of knee: Secondary | ICD-10-CM

## 2014-09-02 DIAGNOSIS — IMO0001 Reserved for inherently not codable concepts without codable children: Secondary | ICD-10-CM

## 2014-09-02 DIAGNOSIS — D519 Vitamin B12 deficiency anemia, unspecified: Secondary | ICD-10-CM

## 2014-09-02 LAB — TSH: TSH: 1.98 u[IU]/mL (ref 0.35–4.50)

## 2014-09-02 MED ORDER — PREGABALIN 75 MG PO CAPS: 75.0000 mg | ORAL_CAPSULE | Freq: Two times a day (BID) | ORAL | Status: DC

## 2014-09-02 MED ORDER — CYANOCOBALAMIN 1000 MCG/ML IJ SOLN
1000.0000 ug | Freq: Once | INTRAMUSCULAR | Status: AC
  Administered 2014-09-02: 1000 ug via INTRAMUSCULAR

## 2014-09-02 MED ORDER — OXYCODONE-ACETAMINOPHEN 7.5-325 MG PO TABS: 1.0000 | ORAL_TABLET | ORAL | Status: DC | PRN

## 2014-09-02 NOTE — Assessment & Plan Note (Signed)
She received a B12 injection today Will cont his q month

## 2014-09-02 NOTE — Assessment & Plan Note (Signed)
Her TSH is in the normal range She will stay on the current dose 

## 2014-09-02 NOTE — Assessment & Plan Note (Signed)
She will cont percocet as needed for pain 

## 2014-09-02 NOTE — Patient Instructions (Signed)
Hypothyroidism The thyroid is a large gland located in the lower front of your neck. The thyroid gland helps control metabolism. Metabolism is how your body handles food. It controls metabolism with the hormone thyroxine. When this gland is underactive (hypothyroid), it produces too little hormone.  CAUSES These include:   Absence or destruction of thyroid tissue.  Goiter due to iodine deficiency.  Goiter due to medications.  Congenital defects (since birth).  Problems with the pituitary. This causes a lack of TSH (thyroid stimulating hormone). This hormone tells the thyroid to turn out more hormone. SYMPTOMS  Lethargy (feeling as though you have no energy)  Cold intolerance  Weight gain (in spite of normal food intake)  Dry skin  Coarse hair  Menstrual irregularity (if severe, may lead to infertility)  Slowing of thought processes Cardiac problems are also caused by insufficient amounts of thyroid hormone. Hypothyroidism in the newborn is cretinism, and is an extreme form. It is important that this form be treated adequately and immediately or it will lead rapidly to retarded physical and mental development. DIAGNOSIS  To prove hypothyroidism, your caregiver may do blood tests and ultrasound tests. Sometimes the signs are hidden. It may be necessary for your caregiver to watch this illness with blood tests either before or after diagnosis and treatment. TREATMENT  Low levels of thyroid hormone are increased by using synthetic thyroid hormone. This is a safe, effective treatment. It usually takes about four weeks to gain the full effects of the medication. After you have the full effect of the medication, it will generally take another four weeks for problems to leave. Your caregiver may start you on low doses. If you have had heart problems the dose may be gradually increased. It is generally not an emergency to get rapidly to normal. HOME CARE INSTRUCTIONS   Take your  medications as your caregiver suggests. Let your caregiver know of any medications you are taking or start taking. Your caregiver will help you with dosage schedules.  As your condition improves, your dosage needs may increase. It will be necessary to have continuing blood tests as suggested by your caregiver.  Report all suspected medication side effects to your caregiver. SEEK MEDICAL CARE IF: Seek medical care if you develop:  Sweating.  Tremulousness (tremors).  Anxiety.  Rapid weight loss.  Heat intolerance.  Emotional swings.  Diarrhea.  Weakness. SEEK IMMEDIATE MEDICAL CARE IF:  You develop chest pain, an irregular heart beat (palpitations), or a rapid heart beat. MAKE SURE YOU:   Understand these instructions.  Will watch your condition.  Will get help right away if you are not doing well or get worse. Document Released: 06/26/2005 Document Revised: 09/18/2011 Document Reviewed: 02/14/2008 ExitCare Patient Information 2015 [employer]. This information is not intended to replace advice given to you by your health care provider. Make sure you discuss any questions you have with your health care provider.  

## 2014-09-02 NOTE — Progress Notes (Signed)
Subjective:    Patient ID: Danielle Holt, female    DOB: [birth year], 59 y.o.   MRN: [id number]  Thyroid Problem Presents for follow-up visit. Symptoms include anxiety and fatigue. Patient reports no cold intolerance, constipation, depressed mood, diaphoresis, diarrhea, dry skin, hair loss, heat intolerance, hoarse voice, leg swelling, nail problem, palpitations, tremors, visual change, weight gain or weight loss. Past treatments include levothyroxine. The treatment provided moderate relief.      Review of Systems  Constitutional: Positive for fatigue. Negative for fever, chills, weight loss, weight gain, diaphoresis, activity change, appetite change and unexpected weight change.  HENT: Negative.  Negative for hoarse voice, trouble swallowing and voice change.   Eyes: Negative.   Respiratory: Negative.  Negative for cough, choking, shortness of breath, wheezing and stridor.   Cardiovascular: Negative.  Negative for chest pain, palpitations and leg swelling.  Gastrointestinal: Negative.  Negative for nausea, vomiting, abdominal pain, diarrhea, constipation and blood in stool.  Endocrine: Negative.  Negative for cold intolerance and heat intolerance.  Genitourinary: Negative.   Musculoskeletal: Positive for back pain and arthralgias.       She complains of burning nerve pain in both feet and she wants to try lyrica  Skin: Negative.   Allergic/Immunologic: Negative.   Neurological: Negative.  Negative for tremors.  Hematological: Negative.  Negative for adenopathy. Does not bruise/bleed easily.  Psychiatric/Behavioral: The patient is nervous/anxious.        Objective:   Physical Exam  Constitutional: She is oriented to person, place, and time. She appears well-developed and well-nourished. No distress.  HENT:  Head: Normocephalic and atraumatic.  Mouth/Throat: Oropharynx is clear and moist. No oropharyngeal exudate.  Eyes: Conjunctivae are normal. Right eye exhibits no discharge.  Left eye exhibits no discharge. No scleral icterus.  Neck: Normal range of motion. Neck supple. No JVD present. No tracheal deviation present. No thyromegaly present.  Cardiovascular: Normal rate, regular rhythm, normal heart sounds and intact distal pulses.  Exam reveals no gallop and no friction rub.   No murmur heard. Pulmonary/Chest: Effort normal and breath sounds normal. No stridor. No respiratory distress. She has no wheezes. She has no rales. She exhibits no tenderness.  Abdominal: Soft. Bowel sounds are normal. She exhibits no distension and no mass. There is no tenderness. There is no rebound and no guarding.  Musculoskeletal: Normal range of motion. She exhibits no edema or tenderness.       Right knee: Normal.       Left knee: Normal.       Lumbar back: Normal. She exhibits normal range of motion, no tenderness, no bony tenderness, no swelling and no edema.  Lymphadenopathy:    She has no cervical adenopathy.  Neurological: She is oriented to person, place, and time. She has normal strength. She displays no atrophy and no tremor. No cranial nerve deficit or sensory deficit. She exhibits normal muscle tone. She displays a negative Romberg sign. She displays no seizure activity. Coordination and gait normal.  Reflex Scores:      Tricep reflexes are 0 on the right side and 0 on the left side.      Bicep reflexes are 0 on the right side and 0 on the left side.      Brachioradialis reflexes are 0 on the right side and 0 on the left side.      Patellar reflexes are 0 on the right side and 0 on the left side.      Achilles reflexes are  0 on the right side and 0 on the left side. Skin: Skin is warm and dry. No rash noted. She is not diaphoretic. No erythema. No pallor.  Vitals reviewed.    Lab Results  Component Value Date   WBC 7.1 07/22/2014   HGB 10.1* 07/22/2014   HCT 30.5* 07/22/2014   PLT 353.0 07/22/2014   GLUCOSE 99 07/22/2014   CHOL 215* 10/29/2012   TRIG 116.0 10/29/2012    HDL 83.30 10/29/2012   LDLDIRECT 105.9 10/29/2012   LDLCALC 46 02/23/2012   ALT 9 07/22/2014   AST 14 07/22/2014   NA 141 07/22/2014   K 3.6 07/22/2014   CL 108 07/22/2014   CREATININE 0.87 07/22/2014   BUN 14 07/22/2014   CO2 25 07/22/2014   TSH 1.46 07/22/2014   HGBA1C 5.7 10/29/2012       Assessment & Plan:

## 2014-09-02 NOTE — Assessment & Plan Note (Signed)
Her BP is well controlled 

## 2014-09-02 NOTE — Assessment & Plan Note (Signed)
She will try lyrica for this

## 2014-09-02 NOTE — Progress Notes (Signed)
Pre visit review using our clinic review tool, if applicable. No additional management support is needed unless otherwise documented below in the visit note. 

## 2014-09-22 ENCOUNTER — Telehealth: Payer: Self-pay

## 2014-09-22 NOTE — Telephone Encounter (Signed)
LVM for the patient to call the practice to confirm date of flu vaccination or to call the office for an apt to take a flu shot. 

## 2014-09-29 ENCOUNTER — Ambulatory Visit: Payer: 59 | Admitting: Internal Medicine

## 2014-10-09 ENCOUNTER — Telehealth: Payer: Self-pay | Admitting: Internal Medicine

## 2014-10-09 NOTE — Telephone Encounter (Signed)
She called regarding paperwork that she sent in 03/22 regarding a wheel chair. Wondering if you have the paperwork. Natasha's ext is [phone]

## 2014-10-12 NOTE — Telephone Encounter (Signed)
I have not personally seen any forms for patient as of recently.

## 2014-10-13 NOTE — Telephone Encounter (Signed)
Charity fundraiserCalled [employer]. Left message with operator regarding the forms. Informed that we have not received any and left side A fax number [phone]).  They are going to send it to that number.

## 2014-10-13 NOTE — Telephone Encounter (Signed)
Returned call to AquillaNatasha, per voice prompt office is not open yet. I will try again doing business hours.

## 2014-11-12 ENCOUNTER — Ambulatory Visit: Payer: 59 | Admitting: Internal Medicine

## 2014-11-23 ENCOUNTER — Ambulatory Visit: Payer: 59 | Admitting: Internal Medicine

## 2014-12-09 ENCOUNTER — Ambulatory Visit: Payer: 59 | Admitting: Internal Medicine

## 2015-01-16 ENCOUNTER — Other Ambulatory Visit: Payer: Self-pay | Admitting: Internal Medicine

## 2015-02-09 ENCOUNTER — Ambulatory Visit: Payer: 59 | Admitting: Internal Medicine

## 2015-02-17 ENCOUNTER — Encounter: Payer: Self-pay | Admitting: Internal Medicine

## 2015-02-17 ENCOUNTER — Ambulatory Visit (INDEPENDENT_AMBULATORY_CARE_PROVIDER_SITE_OTHER): Payer: Medicare Other | Admitting: Internal Medicine

## 2015-02-17 VITALS — BP 120/82 | HR 78 | Temp 98.4°F | Resp 16 | Ht 62.0 in | Wt 239.0 lb

## 2015-02-17 DIAGNOSIS — M170 Bilateral primary osteoarthritis of knee: Secondary | ICD-10-CM | POA: Diagnosis not present

## 2015-02-17 DIAGNOSIS — I10 Essential (primary) hypertension: Secondary | ICD-10-CM

## 2015-02-17 DIAGNOSIS — E038 Other specified hypothyroidism: Secondary | ICD-10-CM

## 2015-02-17 DIAGNOSIS — M5416 Radiculopathy, lumbar region: Secondary | ICD-10-CM | POA: Diagnosis not present

## 2015-02-17 DIAGNOSIS — M940 Chondrocostal junction syndrome [Tietze]: Secondary | ICD-10-CM

## 2015-02-17 MED ORDER — OXYCODONE-ACETAMINOPHEN 7.5-325 MG PO TABS: 1.0000 | ORAL_TABLET | ORAL | Status: DC | PRN

## 2015-02-17 MED ORDER — OXYCODONE-ACETAMINOPHEN 7.5-325 MG PO TABS
1.0000 | ORAL_TABLET | ORAL | Status: DC | PRN
Start: 2015-02-17 — End: 2015-02-17

## 2015-02-17 MED ORDER — NORTRIPTYLINE HCL 10 MG PO CAPS: 10.0000 mg | ORAL_CAPSULE | Freq: Every day | ORAL | Status: DC

## 2015-02-17 NOTE — Progress Notes (Signed)
Subjective:  Patient ID: Danielle Holt, female    DOB: 07-Dec-1955  Age: 59 y.o. MRN: [id number]  CC: Hypertension; Hypothyroidism; and Osteoarthritis   HPI Danielle Holt presents for follow-up. She requests an increase in the dose of her medicine for pain. She states that she is taking oxycodone 3 times a day not twice a day to get adequate pain relief. She offers no other complaints today.  Outpatient Prescriptions Prior to Visit  Medication Sig Dispense Refill  . albuterol (PROAIR HFA) 108 (90 BASE) MCG/ACT inhaler Inhale 2 puffs into the lungs every 6 (six) hours as needed for wheezing. 1 Inhaler 11  . amLODipine (NORVASC) 5 MG tablet Take 1 tablet (5 mg total) by mouth daily. 90 tablet 3  . atorvastatin (LIPITOR) 20 MG tablet Take 20 mg by mouth daily.    Marland Kitchen buPROPion (WELLBUTRIN) 75 MG tablet TAKE 1 TABLET BY MOUTH TWICE DAILY 60 tablet 11  . dexlansoprazole (DEXILANT) 60 MG capsule Take 60 mg by mouth daily.    . ferrous sulfate 325 (65 FE) MG tablet Take 1 tablet (325 mg total) by mouth daily with breakfast. 90 tablet 3  . Fluticasone-Salmeterol (ADVAIR DISKUS) 250-50 MCG/DOSE AEPB Inhale 1 puff into the lungs 2 (two) times daily. 60 each 11  . L-Methylfolate-Algae (DEPLIN 15) 15-90.314 MG CAPS Take 1 capsule by mouth daily. 90 capsule 3  . Levothyroxine Sodium 13 MCG CAPS Take 1 capsule (13 mcg total) by mouth daily. 30 capsule 5  . losartan (COZAAR) 50 MG tablet Take 1 tablet (50 mg total) by mouth daily. 90 tablet 3  . metoprolol succinate (TOPROL-XL) 25 MG 24 hr tablet Take 25 mg by mouth daily.    Marland Kitchen tiZANidine (ZANAFLEX) 4 MG tablet Take 1 tablet (4 mg total) by mouth 2 (two) times daily as needed. 60 tablet 11  . ziprasidone (GEODON) 40 MG capsule TAKE 1 CAPSULE BY MOUTH AT BEDTIME 90 capsule 3  . meloxicam (MOBIC) 15 MG tablet Take 15 mg by mouth daily.    . nortriptyline (PAMELOR) 10 MG capsule TAKE 1 CAPSULE BY MOUTH EVERY NIGHT AT BEDTIME 90 capsule 0  .  oxyCODONE-acetaminophen (PERCOCET) 7.5-325 MG per tablet Take 1 tablet by mouth every 4 (four) hours as needed for pain. 65 tablet 0  . pregabalin (LYRICA) 75 MG capsule Take 1 capsule (75 mg total) by mouth 2 (two) times daily. 60 capsule 5  . clonazePAM (KLONOPIN) 1 MG tablet TAKE 1 TABLET BY MOUTH TWICE DAILY AS NEEDED FOR ANXIETY (Patient not taking: Reported on 02/17/2015) 60 tablet 1   No facility-administered medications prior to visit.    ROS Review of Systems  Constitutional: Negative for fever, chills, diaphoresis, appetite change and fatigue.  HENT: Negative.   Eyes: Negative.   Respiratory: Negative.  Negative for cough, choking, chest tightness, shortness of breath and stridor.   Cardiovascular: Negative.  Negative for chest pain, palpitations and leg swelling.  Gastrointestinal: Negative.  Negative for nausea, vomiting, abdominal pain, diarrhea, constipation and blood in stool.  Endocrine: Negative.   Genitourinary: Negative.   Musculoskeletal: Positive for back pain and arthralgias. Negative for myalgias, joint swelling, gait problem, neck pain and neck stiffness.  Skin: Negative.   Allergic/Immunologic: Negative.   Neurological: Negative.  Negative for dizziness, tremors, syncope, light-headedness and numbness.  Hematological: Negative.  Negative for adenopathy. Does not bruise/bleed easily.  Psychiatric/Behavioral: Negative.     Objective:  BP 120/82 mmHg  Pulse 78  Temp(Src) 98.4 F (36.9  C) (Oral)  Resp 16  Ht 5\' 2"  (1.575 m)  Wt 239 lb (108.41 kg)  BMI 43.70 kg/m2  SpO2 96%  BP Readings from Last 3 Encounters:  02/17/15 120/82  09/02/14 118/80  07/22/14 118/80    Wt Readings from Last 3 Encounters:  02/17/15 239 lb (108.41 kg)  09/02/14 246 lb (111.585 kg)  07/22/14 255 lb 12 oz (116.007 kg)    Physical Exam  Constitutional: She is oriented to person, place, and time. No distress.  HENT:  Mouth/Throat: Oropharynx is clear and moist. No  oropharyngeal exudate.  Eyes: Conjunctivae are normal. Right eye exhibits no discharge. Left eye exhibits no discharge. No scleral icterus.  Neck: Normal range of motion. Neck supple. No JVD present. No tracheal deviation present. No thyromegaly present.  Cardiovascular: Normal rate, regular rhythm, normal heart sounds and intact distal pulses.  Exam reveals no gallop and no friction rub.   No murmur heard. Pulmonary/Chest: Effort normal and breath sounds normal. No stridor. No respiratory distress. She has no wheezes. She has no rales. She exhibits no tenderness.  Abdominal: Soft. Bowel sounds are normal. She exhibits no distension and no mass. There is no tenderness. There is no rebound and no guarding.  Musculoskeletal: Normal range of motion. She exhibits no edema or tenderness.  Lymphadenopathy:    She has no cervical adenopathy.  Neurological: She is oriented to person, place, and time. She has normal strength. She displays no atrophy and normal reflexes. No cranial nerve deficit or sensory deficit. She exhibits normal muscle tone. She displays a negative Romberg sign. She displays no seizure activity. Coordination and gait normal.  Reflex Scores:      Tricep reflexes are 1+ on the right side and 1+ on the left side.      Bicep reflexes are 1+ on the right side and 1+ on the left side.      Brachioradialis reflexes are 1+ on the right side and 1+ on the left side.      Patellar reflexes are 1+ on the right side and 1+ on the left side.      Achilles reflexes are 1+ on the right side and 1+ on the left side. Negative straight leg raise in bilateral lower extremities  Skin: Skin is warm and dry. No rash noted. She is not diaphoretic. No erythema. No pallor.  Vitals reviewed.   Lab Results  Component Value Date   WBC 7.1 07/22/2014   HGB 10.1* 07/22/2014   HCT 30.5* 07/22/2014   PLT 353.0 07/22/2014   GLUCOSE 99 07/22/2014   CHOL 215* 10/29/2012   TRIG 116.0 10/29/2012   HDL 83.30  10/29/2012   LDLDIRECT 105.9 10/29/2012   LDLCALC 46 02/23/2012   ALT 9 07/22/2014   AST 14 07/22/2014   NA 141 07/22/2014   K 3.6 07/22/2014   CL 108 07/22/2014   CREATININE 0.87 07/22/2014   BUN 14 07/22/2014   CO2 25 07/22/2014   TSH 1.98 09/02/2014   HGBA1C 5.7 10/29/2012    Dg Foot Complete Right  05/14/2012   *RADIOLOGY REPORT*  Clinical Data: Right foot pain for 1 month  RIGHT FOOT COMPLETE - 3+ VIEW  Comparison: None  Findings: Bone mineralization normal. Joint spaces preserved. No fracture, dislocation, or bone destruction.  IMPRESSION: Normal exam.   Original Report Authenticated By: Ulyses Southward, M.D.    Assessment & Plan:   Kylee was seen today for hypertension, hypothyroidism and osteoarthritis.  Diagnoses and all orders for this  visit:  Left lumbar radiculitis- she has no signs of radiculopathy, will continue Percocet as needed for pain. -     Discontinue: oxyCODONE-acetaminophen (PERCOCET) 7.5-325 MG per tablet; Take 1 tablet by mouth every 4 (four) hours as needed. -     Discontinue: oxyCODONE-acetaminophen (PERCOCET) 7.5-325 MG per tablet; Take 1 tablet by mouth every 4 (four) hours as needed. -     Discontinue: oxyCODONE-acetaminophen (PERCOCET) 7.5-325 MG per tablet; Take 1 tablet by mouth every 4 (four) hours as needed. -     oxyCODONE-acetaminophen (PERCOCET) 7.5-325 MG per tablet; Take 1 tablet by mouth every 4 (four) hours as needed.  Primary osteoarthritis of both knees -     Discontinue: oxyCODONE-acetaminophen (PERCOCET) 7.5-325 MG per tablet; Take 1 tablet by mouth every 4 (four) hours as needed. -     Discontinue: oxyCODONE-acetaminophen (PERCOCET) 7.5-325 MG per tablet; Take 1 tablet by mouth every 4 (four) hours as needed. -     Discontinue: oxyCODONE-acetaminophen (PERCOCET) 7.5-325 MG per tablet; Take 1 tablet by mouth every 4 (four) hours as needed. -     oxyCODONE-acetaminophen (PERCOCET) 7.5-325 MG per tablet; Take 1 tablet by mouth every 4  (four) hours as needed.  Other specified hypothyroidism- I will recheck her TSH and will adjust her thyroid dosage if needed -     TSH; Future  Essential hypertension, benign- her blood pressure is well controlled, I will monitor her lites and renal function today. -     Basic metabolic panel; Future -     CBC with Differential/Platelet; Future -     Hepatitis C antibody; Future  Costochondritis- her chest pain has resolved, will continue Pamelor and Percocet -     nortriptyline (PAMELOR) 10 MG capsule; Take 1 capsule (10 mg total) by mouth at bedtime.   I have discontinued Ms. Warmuth's clonazePAM, meloxicam, pregabalin, and butalbital-acetaminophen-caffeine. I have also changed her nortriptyline. Additionally, I am having her maintain her Fluticasone-Salmeterol, dexlansoprazole, albuterol, tiZANidine, DEPLIN 15, Levothyroxine Sodium, ferrous sulfate, losartan, ziprasidone, amLODipine, buPROPion, atorvastatin, metoprolol succinate, triamterene-hydrochlorothiazide, potassium chloride SA, and oxyCODONE-acetaminophen.  Meds ordered this encounter  Medications  . DISCONTD: butalbital-acetaminophen-caffeine (FIORICET, ESGIC) 50-325-40 MG per tablet    Sig: Take by mouth.  . DISCONTD: buPROPion (WELLBUTRIN XL) 150 MG 24 hr tablet    Sig: TK 2 TS PO QD    Refill:  1  . triamterene-hydrochlorothiazide (MAXZIDE-25) 37.5-25 MG per tablet    Sig: TK 1 T PO QD    Refill:  2  . potassium chloride SA (K-DUR,KLOR-CON) 20 MEQ tablet    Sig: TK 2 TS PO QD    Refill:  1  . DISCONTD: oxyCODONE-acetaminophen (PERCOCET) 7.5-325 MG per tablet    Sig: Take 1 tablet by mouth every 4 (four) hours as needed.    Dispense:  90 tablet    Refill:  0    Fill on or after 02/17/15  . nortriptyline (PAMELOR) 10 MG capsule    Sig: Take 1 capsule (10 mg total) by mouth at bedtime.    Dispense:  90 capsule    Refill:  3  . DISCONTD: oxyCODONE-acetaminophen (PERCOCET) 7.5-325 MG per tablet    Sig: Take 1 tablet by  mouth every 4 (four) hours as needed.    Dispense:  90 tablet    Refill:  0    Fill on or after 03/20/15  . DISCONTD: oxyCODONE-acetaminophen (PERCOCET) 7.5-325 MG per tablet    Sig: Take 1 tablet by mouth every 4 (four)  hours as needed.    Dispense:  90 tablet    Refill:  0    Fill on or after 04/19/15  . oxyCODONE-acetaminophen (PERCOCET) 7.5-325 MG per tablet    Sig: Take 1 tablet by mouth every 4 (four) hours as needed.    Dispense:  90 tablet    Refill:  0    Fill on or after 05/20/15     Follow-up: Return in about 4 months (around 06/19/2015).  Sanda Linger, MD

## 2015-02-17 NOTE — Patient Instructions (Signed)
Back Pain, Adult Low back pain is very common. About 1 in 5 people have back pain.The cause of low back pain is rarely dangerous. The pain often gets better over time.About half of people with a sudden onset of back pain feel better in just 2 weeks. About 8 in 10 people feel better by 6 weeks.  CAUSES Some common causes of back pain include:  Strain of the muscles or ligaments supporting the spine.  Wear and tear (degeneration) of the spinal discs.  Arthritis.  Direct injury to the back. DIAGNOSIS Most of the time, the direct cause of low back pain is not known.However, back pain can be treated effectively even when the exact cause of the pain is unknown.Answering your caregiver's questions about your overall health and symptoms is one of the most accurate ways to make sure the cause of your pain is not dangerous. If your caregiver needs more information, he or she may order lab work or imaging tests (X-rays or MRIs).However, even if imaging tests show changes in your back, this usually does not require surgery. HOME CARE INSTRUCTIONS For many people, back pain returns.Since low back pain is rarely dangerous, it is often a condition that people can learn to manageon their own.   Remain active. It is stressful on the back to sit or stand in one place. Do not sit, drive, or stand in one place for more than 30 minutes at a time. Take short walks on level surfaces as soon as pain allows.Try to increase the length of time you walk each day.  Do not stay in bed.Resting more than 1 or 2 days can delay your recovery.  Do not avoid exercise or work.Your body is made to move.It is not dangerous to be active, even though your back may hurt.Your back will likely heal faster if you return to being active before your pain is gone.  Pay attention to your body when you bend and lift. Many people have less discomfortwhen lifting if they bend their knees, keep the load close to their bodies,and  avoid twisting. Often, the most comfortable positions are those that put less stress on your recovering back.  Find a comfortable position to sleep. Use a firm mattress and lie on your side with your knees slightly bent. If you lie on your back, put a pillow under your knees.  Only take over-the-counter or prescription medicines as directed by your caregiver. Over-the-counter medicines to reduce pain and inflammation are often the most helpful.Your caregiver may prescribe muscle relaxant drugs.These medicines help dull your pain so you can more quickly return to your normal activities and healthy exercise.  Put ice on the injured area.  Put ice in a plastic bag.  Place a towel between your skin and the bag.  Leave the ice on for 15-20 minutes, 03-04 times a day for the first 2 to 3 days. After that, ice and heat may be alternated to reduce pain and spasms.  Ask your caregiver about trying back exercises and gentle massage. This may be of some benefit.  Avoid feeling anxious or stressed.Stress increases muscle tension and can worsen back pain.It is important to recognize when you are anxious or stressed and learn ways to manage it.Exercise is a great option. SEEK MEDICAL CARE IF:  You have pain that is not relieved with rest or medicine.  You have pain that does not improve in 1 week.  You have new symptoms.  You are generally not feeling well. SEEK   IMMEDIATE MEDICAL CARE IF:   You have pain that radiates from your back into your legs.  You develop new bowel or bladder control problems.  You have unusual weakness or numbness in your arms or legs.  You develop nausea or vomiting.  You develop abdominal pain.  You feel faint. Document Released: 06/26/2005 Document Revised: 12/26/2011 Document Reviewed: 10/28/2013 [employer] Patient Information 2015 [employer]. This information is not intended to replace advice given to you by your health care provider. Make sure you  discuss any questions you have with your health care provider.  

## 2015-02-17 NOTE — Progress Notes (Signed)
Pre visit review using our clinic review tool, if applicable. No additional management support is needed unless otherwise documented below in the visit note. 

## 2015-04-01 ENCOUNTER — Other Ambulatory Visit: Payer: Self-pay | Admitting: Internal Medicine

## 2015-05-03 ENCOUNTER — Telehealth: Payer: Self-pay | Admitting: Internal Medicine

## 2015-05-03 NOTE — Telephone Encounter (Signed)
Patient called to advise that [facility] will be sending in a request for a script on diabetic shoes. She does in fact wish to get these.

## 2015-05-06 ENCOUNTER — Other Ambulatory Visit: Payer: Medicare Other

## 2015-05-06 ENCOUNTER — Encounter: Payer: Self-pay | Admitting: Internal Medicine

## 2015-05-06 ENCOUNTER — Ambulatory Visit (INDEPENDENT_AMBULATORY_CARE_PROVIDER_SITE_OTHER): Payer: Medicare Other | Admitting: Internal Medicine

## 2015-05-06 ENCOUNTER — Other Ambulatory Visit (INDEPENDENT_AMBULATORY_CARE_PROVIDER_SITE_OTHER): Payer: Medicare Other

## 2015-05-06 VITALS — BP 120/70 | HR 74 | Temp 98.2°F | Resp 16 | Ht 61.0 in | Wt 243.0 lb

## 2015-05-06 DIAGNOSIS — Z1231 Encounter for screening mammogram for malignant neoplasm of breast: Secondary | ICD-10-CM

## 2015-05-06 DIAGNOSIS — D519 Vitamin B12 deficiency anemia, unspecified: Secondary | ICD-10-CM

## 2015-05-06 DIAGNOSIS — R739 Hyperglycemia, unspecified: Secondary | ICD-10-CM | POA: Diagnosis not present

## 2015-05-06 DIAGNOSIS — M5416 Radiculopathy, lumbar region: Secondary | ICD-10-CM | POA: Diagnosis not present

## 2015-05-06 DIAGNOSIS — I10 Essential (primary) hypertension: Secondary | ICD-10-CM | POA: Diagnosis not present

## 2015-05-06 DIAGNOSIS — D509 Iron deficiency anemia, unspecified: Secondary | ICD-10-CM | POA: Diagnosis not present

## 2015-05-06 DIAGNOSIS — E038 Other specified hypothyroidism: Secondary | ICD-10-CM

## 2015-05-06 DIAGNOSIS — E785 Hyperlipidemia, unspecified: Secondary | ICD-10-CM

## 2015-05-06 DIAGNOSIS — M170 Bilateral primary osteoarthritis of knee: Secondary | ICD-10-CM

## 2015-05-06 DIAGNOSIS — K529 Noninfective gastroenteritis and colitis, unspecified: Secondary | ICD-10-CM

## 2015-05-06 LAB — CBC WITH DIFFERENTIAL/PLATELET
BASOS ABS: 0 10*3/uL (ref 0.0–0.1)
BASOS PCT: 0.5 % (ref 0.0–3.0)
EOS ABS: 0 10*3/uL (ref 0.0–0.7)
Eosinophils Relative: 0.5 % (ref 0.0–5.0)
HEMATOCRIT: 36.7 % (ref 36.0–46.0)
HEMOGLOBIN: 12 g/dL (ref 12.0–15.0)
LYMPHS PCT: 29.4 % (ref 12.0–46.0)
Lymphs Abs: 1.8 10*3/uL (ref 0.7–4.0)
MCHC: 32.7 g/dL (ref 30.0–36.0)
MCV: 91.4 fl (ref 78.0–100.0)
MONO ABS: 0.5 10*3/uL (ref 0.1–1.0)
Monocytes Relative: 9.1 % (ref 3.0–12.0)
Neutro Abs: 3.6 10*3/uL (ref 1.4–7.7)
Neutrophils Relative %: 60.5 % (ref 43.0–77.0)
Platelets: 225 10*3/uL (ref 150.0–400.0)
RBC: 4.01 Mil/uL (ref 3.87–5.11)
RDW: 17.1 % — ABNORMAL HIGH (ref 11.5–15.5)
WBC: 5.9 10*3/uL (ref 4.0–10.5)

## 2015-05-06 LAB — BASIC METABOLIC PANEL
BUN: 13 mg/dL (ref 6–23)
CHLORIDE: 109 meq/L (ref 96–112)
CO2: 24 meq/L (ref 19–32)
Calcium: 9.1 mg/dL (ref 8.4–10.5)
Creatinine, Ser: 0.9 mg/dL (ref 0.40–1.20)
GFR: 82.86 mL/min (ref >60.00)
GLUCOSE: 112 mg/dL — AB (ref 70–99)
POTASSIUM: 4.2 meq/L (ref 3.5–5.1)
SODIUM: 141 meq/L (ref 135–145)

## 2015-05-06 LAB — COMPREHENSIVE METABOLIC PANEL
ALK PHOS: 96 U/L (ref 39–117)
ALT: 21 U/L (ref 0–35)
AST: 21 U/L (ref 0–37)
Albumin: 3.9 g/dL (ref 3.5–5.2)
BILIRUBIN TOTAL: 0.2 mg/dL (ref 0.2–1.2)
BUN: 13 mg/dL (ref 6–23)
CO2: 24 meq/L (ref 19–32)
Calcium: 9.1 mg/dL (ref 8.4–10.5)
Chloride: 109 mEq/L (ref 96–112)
Creatinine, Ser: 0.9 mg/dL (ref 0.40–1.20)
GFR: 82.86 mL/min (ref >60.00)
GLUCOSE: 112 mg/dL — AB (ref 70–99)
Potassium: 4.2 mEq/L (ref 3.5–5.1)
SODIUM: 141 meq/L (ref 135–145)
TOTAL PROTEIN: 7 g/dL (ref 6.0–8.3)

## 2015-05-06 LAB — LIPID PANEL
CHOLESTEROL: 178 mg/dL (ref 0–200)
HDL: 62.7 mg/dL (ref >39.00)
LDL CALC: 82 mg/dL (ref 0–99)
NONHDL: 115.21
Total CHOL/HDL Ratio: 3
Triglycerides: 165 mg/dL — ABNORMAL HIGH (ref 0.0–149.0)
VLDL: 33 mg/dL (ref 0.0–40.0)

## 2015-05-06 LAB — IBC PANEL
Iron: 52 ug/dL (ref 42–145)
SATURATION RATIOS: 13.7 % — AB (ref 20.0–50.0)
TRANSFERRIN: 272 mg/dL (ref 212.0–360.0)

## 2015-05-06 LAB — FOLATE: Folate: 8.9 ng/mL (ref >5.9)

## 2015-05-06 LAB — HEMOGLOBIN A1C: Hgb A1c MFr Bld: 5.6 % (ref 4.6–6.5)

## 2015-05-06 LAB — TSH: TSH: 1.81 u[IU]/mL (ref 0.35–4.50)

## 2015-05-06 LAB — FERRITIN: Ferritin: 209.5 ng/mL (ref 10.0–291.0)

## 2015-05-06 LAB — VITAMIN B12: Vitamin B-12: >1500 pg/mL — ABNORMAL HIGH (ref 211–911)

## 2015-05-06 MED ORDER — CYANOCOBALAMIN 1000 MCG/ML IJ SOLN
1000.0000 ug | Freq: Once | INTRAMUSCULAR | Status: AC
  Administered 2015-05-06: 1000 ug via INTRAMUSCULAR

## 2015-05-06 MED ORDER — OXYCODONE-ACETAMINOPHEN 7.5-325 MG PO TABS: 1.0000 | ORAL_TABLET | ORAL | Status: DC | PRN

## 2015-05-06 NOTE — Patient Instructions (Signed)
Anemia, Nonspecific Anemia is a condition in which the concentration of red blood cells or hemoglobin in the blood is below normal. Hemoglobin is a substance in red blood cells that carries oxygen to the tissues of the body. Anemia results in not enough oxygen reaching these tissues.  CAUSES  Common causes of anemia include:   Excessive bleeding. Bleeding may be internal or external. This includes excessive bleeding from periods (in women) or from the intestine.   Poor nutrition.   Chronic kidney, thyroid, and liver disease.  Bone marrow disorders that decrease red blood cell production.  Cancer and treatments for cancer.  HIV, AIDS, and their treatments.  Spleen problems that increase red blood cell destruction.  Blood disorders.  Excess destruction of red blood cells due to infection, medicines, and autoimmune disorders. SIGNS AND SYMPTOMS   Minor weakness.   Dizziness.   Headache.  Palpitations.   Shortness of breath, especially with exercise.   Paleness.  Cold sensitivity.  Indigestion.  Nausea.  Difficulty sleeping.  Difficulty concentrating. Symptoms may occur suddenly or they may develop slowly.  DIAGNOSIS  Additional blood tests are often needed. These help your health care provider determine the best treatment. Your health care provider will check your stool for blood and look for other causes of blood loss.  TREATMENT  Treatment varies depending on the cause of the anemia. Treatment can include:   Supplements of iron, vitamin B12, or folic acid.   Hormone medicines.   A blood transfusion. This may be needed if blood loss is severe.   Hospitalization. This may be needed if there is significant continual blood loss.   Dietary changes.  Spleen removal. HOME CARE INSTRUCTIONS Keep all follow-up appointments. It often takes many weeks to correct anemia, and having your health care provider check on your condition and your response to  treatment is very important. SEEK IMMEDIATE MEDICAL CARE IF:   You develop extreme weakness, shortness of breath, or chest pain.   You become dizzy or have trouble concentrating.  You develop heavy vaginal bleeding.   You develop a rash.   You have bloody or black, tarry stools.   You faint.   You vomit up blood.   You vomit repeatedly.   You have abdominal pain.  You have a fever or persistent symptoms for more than 2-3 days.   You have a fever and your symptoms suddenly get worse.   You are dehydrated.  MAKE SURE YOU:  Understand these instructions.  Will watch your condition.  Will get help right away if you are not doing well or get worse.   This information is not intended to replace advice given to you by your health care provider. Make sure you discuss any questions you have with your health care provider.   Document Released: 08/03/2004 Document Revised: 02/26/2013 Document Reviewed: 12/20/2012 Elsevier Interactive Patient Education 2016 [employer]  

## 2015-05-06 NOTE — Telephone Encounter (Signed)
Done in OV today.

## 2015-05-06 NOTE — Progress Notes (Signed)
Pre visit review using our clinic review tool, if applicable. No additional management support is needed unless otherwise documented below in the visit note. 

## 2015-05-06 NOTE — Progress Notes (Signed)
Subjective:  Patient ID: Danielle Holt, female    DOB: 22-Feb-1956  Age: 59 y.o. MRN: [id number]  CC: Anemia; Hypertension; Hypothyroidism; Hyperlipidemia; Osteoarthritis; and Back Pain   HPI Danielle Holt presents for follow-up on multiple medical problems but her main complaint today is diarrhea and intermittent bright red blood per rectum. She says she can have up to 4-5 bowel movements a day. She tells me that she has been hospitalized several times this year at [facility] but she has not been told what causes for diarrhea. She also suffers with chronic anemia, chronic pain, and chronic daily headache. She has seen another doctor who started her on butalbital for the headaches. She is requesting a refill.  Outpatient Prescriptions Prior to Visit  Medication Sig Dispense Refill  . albuterol (PROAIR HFA) 108 (90 BASE) MCG/ACT inhaler Inhale 2 puffs into the lungs every 6 (six) hours as needed for wheezing. 1 Inhaler 11  . amLODipine (NORVASC) 2.5 MG tablet TAKE 1 TABLET BY MOUTH EVERY DAY 90 tablet 0  . atorvastatin (LIPITOR) 20 MG tablet TAKE 1 TABLET BY MOUTH EVERY NIGHT AT BEDTIME 90 tablet 0  . dexlansoprazole (DEXILANT) 60 MG capsule Take 60 mg by mouth daily.    . ferrous sulfate 325 (65 FE) MG tablet Take 1 tablet (325 mg total) by mouth daily with breakfast. 90 tablet 3  . Fluticasone-Salmeterol (ADVAIR DISKUS) 250-50 MCG/DOSE AEPB Inhale 1 puff into the lungs 2 (two) times daily. 60 each 11  . Levothyroxine Sodium 13 MCG CAPS Take 1 capsule (13 mcg total) by mouth daily. 30 capsule 5  . losartan (COZAAR) 50 MG tablet Take 1 tablet (50 mg total) by mouth daily. 90 tablet 3  . metoprolol succinate (TOPROL-XL) 50 MG 24 hr tablet TAKE 1 TABLET BY MOUTH DAILY 90 tablet 0  . nortriptyline (PAMELOR) 10 MG capsule Take 1 capsule (10 mg total) by mouth at bedtime. 90 capsule 3  . potassium chloride SA (K-DUR,KLOR-CON) 20 MEQ tablet TK 2 TS PO QD  1  .  tiZANidine (ZANAFLEX) 4 MG tablet Take 1 tablet (4 mg total) by mouth 2 (two) times daily as needed. 60 tablet 11  . triamterene-hydrochlorothiazide (MAXZIDE-25) 37.5-25 MG per tablet TK 1 T PO QD  2  . ziprasidone (GEODON) 40 MG capsule TAKE 1 CAPSULE BY MOUTH AT BEDTIME 90 capsule 3  . amLODipine (NORVASC) 5 MG tablet Take 1 tablet (5 mg total) by mouth daily. 90 tablet 3  . buPROPion (WELLBUTRIN) 75 MG tablet TAKE 1 TABLET BY MOUTH TWICE DAILY 60 tablet 11  . metoprolol succinate (TOPROL-XL) 25 MG 24 hr tablet Take 25 mg by mouth daily.    Marland Kitchen oxyCODONE-acetaminophen (PERCOCET) 7.5-325 MG per tablet Take 1 tablet by mouth every 4 (four) hours as needed. 90 tablet 0  . L-Methylfolate-Algae (DEPLIN 15) 15-90.314 MG CAPS Take 1 capsule by mouth daily. (Patient not taking: Reported on 05/06/2015) 90 capsule 3   No facility-administered medications prior to visit.    ROS Review of Systems  Constitutional: Negative.  Negative for fever, chills, diaphoresis, appetite change and fatigue.  HENT: Negative.   Eyes: Negative.   Respiratory: Negative.  Negative for cough, choking, chest tightness, shortness of breath and stridor.   Cardiovascular: Negative.  Negative for chest pain, palpitations and leg swelling.  Gastrointestinal: Positive for diarrhea and blood in stool. Negative for nausea, vomiting, abdominal pain, constipation, abdominal distention, anal bleeding and rectal pain.  Endocrine: Negative.  Genitourinary: Negative.   Musculoskeletal: Positive for back pain and arthralgias. Negative for myalgias, joint swelling, gait problem, neck pain and neck stiffness.  Skin: Negative.  Negative for color change, pallor and rash.  Allergic/Immunologic: Negative.   Neurological: Negative.  Negative for dizziness, tremors, weakness, light-headedness, numbness and headaches.  Hematological: Negative.  Negative for adenopathy. Does not bruise/bleed easily.  Psychiatric/Behavioral: Positive for  dysphoric mood. Negative for suicidal ideas, hallucinations, behavioral problems, confusion, sleep disturbance, self-injury, decreased concentration and agitation. The patient is nervous/anxious. The patient is not hyperactive.     Objective:  BP 120/70 mmHg  Pulse 74  Temp(Src) 98.2 F (36.8 C) (Oral)  Resp 16  Ht  (1.549 m)  Wt 243 lb (110.224 kg)  BMI 45.94 kg/m2  SpO2 98%  BP Readings from Last 3 Encounters:  05/06/15 120/70  02/17/15 120/82  09/02/14 118/80    Wt Readings from Last 3 Encounters:  05/06/15 243 lb (110.224 kg)  02/17/15 239 lb (108.41 kg)  09/02/14 246 lb (111.585 kg)    Physical Exam  Constitutional: She is oriented to person, place, and time. She appears well-developed and well-nourished. No distress.  HENT:  Head: Normocephalic and atraumatic.  Mouth/Throat: Oropharynx is clear and moist. No oropharyngeal exudate.  Eyes: Conjunctivae are normal. Right eye exhibits no discharge. Left eye exhibits no discharge. No scleral icterus.  Neck: Normal range of motion. Neck supple. No JVD present. No tracheal deviation present. No thyromegaly present.  Cardiovascular: Normal rate, regular rhythm, normal heart sounds and intact distal pulses.  Exam reveals no gallop and no friction rub.   No murmur heard. Pulmonary/Chest: Effort normal and breath sounds normal. No stridor. No respiratory distress. She has no wheezes. She has no rales. She exhibits no tenderness.  Abdominal: Soft. Bowel sounds are normal. She exhibits no distension and no mass. There is no tenderness. There is no rebound and no guarding.  Musculoskeletal: Normal range of motion. She exhibits no edema or tenderness.  Lymphadenopathy:    She has no cervical adenopathy.  Neurological: She is oriented to person, place, and time. She has normal strength. She displays no atrophy, no tremor and normal reflexes. No cranial nerve deficit or sensory deficit. She exhibits normal muscle tone. She displays  a negative Romberg sign. She displays no seizure activity. Coordination and gait normal.  Skin: Skin is warm and dry. No rash noted. She is not diaphoretic. No erythema. No pallor.  Psychiatric: She has a normal mood and affect. Her behavior is normal. Judgment and thought content normal.  Vitals reviewed.   Lab Results  Component Value Date   WBC 7.1 07/22/2014   HGB 10.1* 07/22/2014   HCT 30.5* 07/22/2014   PLT 353.0 07/22/2014   GLUCOSE 99 07/22/2014   CHOL 215* 10/29/2012   TRIG 116.0 10/29/2012   HDL 83.30 10/29/2012   LDLDIRECT 105.9 10/29/2012   LDLCALC 46 02/23/2012   ALT 9 07/22/2014   AST 14 07/22/2014   NA 141 07/22/2014   K 3.6 07/22/2014   CL 108 07/22/2014   CREATININE 0.87 07/22/2014   BUN 14 07/22/2014   CO2 25 07/22/2014   TSH 1.98 09/02/2014   HGBA1C 5.7 10/29/2012    Dg Foot Complete Right  05/14/2012  *RADIOLOGY REPORT* Clinical Data: Right foot pain for 1 month RIGHT FOOT COMPLETE - 3+ VIEW Comparison: None Findings: Bone mineralization normal. Joint spaces preserved. No fracture, dislocation, or bone destruction. IMPRESSION: Normal exam. Original Report Authenticated By: Ulyses Southward, M.D.  Assessment & Plan:   Kristan was seen today for anemia, hypertension, hypothyroidism, hyperlipidemia, osteoarthritis and back pain.  Diagnoses and all orders for this visit:  Essential hypertension, benign- her blood pressure is adequately well controlled, I will monitor her electrolytes and renal function -     Comprehensive metabolic panel; Future  Vitamin B12 deficiency anemia- I will recheck her CBC as well as her vitamin B12 and folate levels, I've asked her to return for monthly injection of B12 -     cyanocobalamin ((VITAMIN B-12)) injection 1,000 mcg; Inject 1 mL (1,000 mcg total) into the muscle once. -     CBC with Differential/Platelet; Future -     Folate; Future -     Vitamin B12; Future  Visit for screening mammogram -     MM Digital  Screening; Future  Other specified hypothyroidism- I will recheck her TSH and adjust her dose if needed. -     TSH; Future  Hyperlipidemia with target LDL less than 100- she is due for repeat lipid panel -     Lipid panel; Future  Left lumbar radiculitis- she will cont percocet as needed for pain -     Comprehensive metabolic panel; Future -     oxyCODONE-acetaminophen (PERCOCET) 7.5-325 MG tablet; Take 1 tablet by mouth every 4 (four) hours as needed.  Chronic diarrhea- I've asked her to follow with gastroenterology about this, she has a history of vitamin deficiencies as well so I will check her for gluten sensitive enteropathy today as well. -     Ambulatory referral to Gastroenterology -     Gliadin antibodies, serum -     Tissue transglutaminase, IgA -     Reticulin Antibody, IgA w reflex titer  Hyperglycemia- she has a history of prediabetes, it doesn't look like she is making any improvement on her lifestyle modifications, I will check her A1c today to see if she has developed type 2 diabetes mellitus. -     Basic metabolic panel; Future -     Hemoglobin A1c; Future  Anemia, iron deficiency- I will recheck her CBC and and her iron level, will re-prescribe or restart iron if indicated. -     CBC with Differential/Platelet; Future -     IBC panel; Future -     Ferritin; Future  Primary osteoarthritis of both knees -     oxyCODONE-acetaminophen (PERCOCET) 7.5-325 MG tablet; Take 1 tablet by mouth every 4 (four) hours as needed.   I have discontinued Ms. Ade's DEPLIN 15 and butalbital-acetaminophen-caffeine. I have also changed her oxyCODONE-acetaminophen. Additionally, I am having her maintain her Fluticasone-Salmeterol, dexlansoprazole, albuterol, tiZANidine, Levothyroxine Sodium, ferrous sulfate, losartan, ziprasidone, triamterene-hydrochlorothiazide, potassium chloride SA, nortriptyline, metoprolol succinate, atorvastatin, amLODipine, clonazePAM, and buPROPion. We  administered cyanocobalamin.  Meds ordered this encounter  Medications  . DISCONTD: butalbital-acetaminophen-caffeine (FIORICET, ESGIC) 50-325-40 MG tablet    Sig:   . clonazePAM (KLONOPIN) 1 MG tablet    Sig:   . buPROPion (WELLBUTRIN XL) 150 MG 24 hr tablet    Sig: TK 2 TS PO QD    Refill:  1  . cyanocobalamin ((VITAMIN B-12)) injection 1,000 mcg    Sig:   . oxyCODONE-acetaminophen (PERCOCET) 7.5-325 MG tablet    Sig: Take 1 tablet by mouth every 4 (four) hours as needed.    Dispense:  90 tablet    Refill:  0    Fill on or after 06/19/15     Follow-up: Return in about 4 months (around  09/06/2015).  Sanda Lingerhomas Lot Medford, MD

## 2015-05-07 LAB — GLIADIN ANTIBODIES, SERUM
Gliadin IgA: 8 Units (ref <20)
Gliadin IgG: 2 Units (ref <20)

## 2015-05-07 LAB — RETICULIN ANTIBODIES, IGA W TITER: RETICULIN AB, IGA: NEGATIVE

## 2015-05-07 LAB — TISSUE TRANSGLUTAMINASE, IGA: Tissue Transglutaminase Ab, IgA: 1 U/mL (ref <4)

## 2015-05-25 ENCOUNTER — Telehealth: Payer: Self-pay | Admitting: Internal Medicine

## 2015-05-25 NOTE — Telephone Encounter (Signed)
Patient is requesting a call back on diabetic shoes.  She is also wanting to know if Dr. Yetta BarreJones has received any records from [facility].  States Dr. Jarold MottoPatterson will not see her until he gets those records.

## 2015-05-26 NOTE — Telephone Encounter (Signed)
Got order refaxed. Forwarded to Dr. Yetta BarreJones for advisement on form

## 2015-05-26 NOTE — Telephone Encounter (Signed)
Will call to get form faxed back. She was informed to call Dr. Norval GablePatterson;s office to get updated info on records

## 2015-05-27 NOTE — Telephone Encounter (Signed)
Dr Yetta BarreJones declined filling out formed. Pt does not have current dx of DM

## 2015-06-02 ENCOUNTER — Other Ambulatory Visit (INDEPENDENT_AMBULATORY_CARE_PROVIDER_SITE_OTHER): Payer: Medicare Other

## 2015-06-02 ENCOUNTER — Other Ambulatory Visit: Payer: Self-pay | Admitting: Gastroenterology

## 2015-06-02 ENCOUNTER — Ambulatory Visit (INDEPENDENT_AMBULATORY_CARE_PROVIDER_SITE_OTHER): Payer: Medicare Other | Admitting: Gastroenterology

## 2015-06-02 ENCOUNTER — Encounter: Payer: Self-pay | Admitting: Gastroenterology

## 2015-06-02 VITALS — BP 110/78 | HR 80 | Ht 62.0 in | Wt 247.0 lb

## 2015-06-02 DIAGNOSIS — R197 Diarrhea, unspecified: Secondary | ICD-10-CM | POA: Diagnosis not present

## 2015-06-02 LAB — IGA: IgA: 212 mg/dL (ref 68–378)

## 2015-06-02 MED ORDER — PANTOPRAZOLE SODIUM 40 MG PO TBEC: 40.0000 mg | DELAYED_RELEASE_TABLET | Freq: Every day | ORAL | Status: DC

## 2015-06-02 NOTE — Patient Instructions (Signed)
Stop Dexilant.  Pantoprazole 40 mg daily.   Your physician has requested that you go to the basement for lab work before leaving today.

## 2015-06-02 NOTE — Progress Notes (Signed)
06/02/2015 Danielle Holt [id number] 06/08/1956   HISTORY OF PRESENT ILLNESS:  This is a pleasant 59 year old female who is previously known to Dr. Jarold Motto for EGD in February 2013 at which time she is found have a hiatal hernia and a stricture at the GE junction. She has been on PPI therapy with Dexilant 60 mg daily at least since that time.  She presents for office today at the request of her PCP, Dr. Yetta Barre, for evaluation regarding diarrhea. She tells me that she has had diarrhea on a daily basis for at least the past year. Sometimes she will have 2-3 bowel movements a day other times it is more like 4-6 bowel movements daily. Says that her stools are dark in color, but she is on iron supplements. She denies seeing any red blood in her stools. Has urgency and incontinence with stools at times.  She was recently hospitalized at [facility] where she underwent colonoscopy, endoscopy, and apparently wireless capsule endoscopy. We are trying to obtain those records. We do have biopsy results from her colonoscopy in the computer system, which state that random biopsies throughout the colon were negative and normal particularly for microscopic colitis, etc. TSH is normal and she is on medication for her thyroid. Celiac studies are normal/negative. She is status post cholecystectomy in 1999.  Denies abdominal pain.   Past Medical History  Diagnosis Date  . B12 DEFICIENCY   . HYPERLIPIDEMIA   . Morbid obesity (HCC)   . TOBACCO USE   . Depressive disorder, not elsewhere classified   . CLUSTER HEADACHE   . ALLERGIC RHINITIS   . ASTHMA   . COPD   . GERD   . ARTHRITIS   . INSOMNIA   . Hypertension   . HYPOKALEMIA, MILD   . Anemia   . Anxiety   . Gallstones    Past Surgical History  Procedure Laterality Date  . Cholecystectomy  1991  . Knee arthroscopy Bilateral   . Tonsillectomy    . Carpal tunnel release Bilateral   . Appendectomy      reports that she has been  smoking Cigarettes.  She has a 20 pack-year smoking history. She has never used smokeless tobacco. She reports that she does not drink alcohol or use illicit drugs. family history is not on file. She was adopted. Allergies  Allergen Reactions  . Fentanyl Other (See Comments)    Hallucinations per pt  . Zolpidem     Other reaction(s): Other (See Comments) Hallucinations.  . Ativan [Lorazepam]     Memory loss  . Hyoscyamine Swelling  . Varenicline     REACTION: amnesia  . Varenicline Tartrate     REACTION: amnesia  . Zolpidem Tartrate     REACTION: Short term memory loss  . Viibryd [Vilazodone Hcl]     Visual disturbance      Outpatient Encounter Prescriptions as of 06/02/2015  Medication Sig  . albuterol (PROAIR HFA) 108 (90 BASE) MCG/ACT inhaler Inhale 2 puffs into the lungs every 6 (six) hours as needed for wheezing.  Marland Kitchen amLODipine (NORVASC) 2.5 MG tablet TAKE 1 TABLET BY MOUTH EVERY DAY  . atorvastatin (LIPITOR) 20 MG tablet TAKE 1 TABLET BY MOUTH EVERY NIGHT AT BEDTIME  . buPROPion (WELLBUTRIN XL) 150 MG 24 hr tablet TK 2 TS PO QD  . clonazePAM (KLONOPIN) 1 MG tablet   . dexlansoprazole (DEXILANT) 60 MG capsule Take 60 mg by mouth daily.  . ferrous sulfate 325 (  65 FE) MG tablet Take 1 tablet (325 mg total) by mouth daily with breakfast.  . Fluticasone-Salmeterol (ADVAIR DISKUS) 250-50 MCG/DOSE AEPB Inhale 1 puff into the lungs 2 (two) times daily.  . Levothyroxine Sodium 13 MCG CAPS Take 1 capsule (13 mcg total) by mouth daily.  Marland Kitchen. losartan (COZAAR) 50 MG tablet Take 1 tablet (50 mg total) by mouth daily.  . metoprolol succinate (TOPROL-XL) 50 MG 24 hr tablet TAKE 1 TABLET BY MOUTH DAILY  . nortriptyline (PAMELOR) 10 MG capsule Take 1 capsule (10 mg total) by mouth at bedtime.  Marland Kitchen. oxyCODONE-acetaminophen (PERCOCET) 7.5-325 MG tablet Take 1 tablet by mouth every 4 (four) hours as needed.  . potassium chloride SA (K-DUR,KLOR-CON) 20 MEQ tablet TK 2 TS PO QD  . tiZANidine  (ZANAFLEX) 4 MG tablet Take 1 tablet (4 mg total) by mouth 2 (two) times daily as needed.  . triamterene-hydrochlorothiazide (MAXZIDE-25) 37.5-25 MG per tablet TK 1 T PO QD  . ziprasidone (GEODON) 40 MG capsule TAKE 1 CAPSULE BY MOUTH AT BEDTIME  . [DISCONTINUED] pantoprazole (PROTONIX) 40 MG tablet Take 1 tablet (40 mg total) by mouth daily.   No facility-administered encounter medications on file as of 06/02/2015.     REVIEW OF SYSTEMS  : All other systems reviewed and negative except where noted in the History of Present Illness.   PHYSICAL EXAM: BP 110/78 mmHg  Pulse 80  Ht 5\' 2"  (1.575 m)  Wt 247 lb (112.038 kg)  BMI 45.17 kg/m2 General: Well developed black female in no acute distress Head: Normocephalic and atraumatic Eyes:  Sclerae anicteric,conjunctive pink. Ears: Normal auditory acuity Lungs: Clear throughout to auscultation Heart: Regular rate and rhythm Abdomen: Soft, non-distended.  Normal bowel sounds.  Non-tender. Musculoskeletal: Symmetrical with no gross deformities  Skin: No lesions on visible extremities Extremities: No edema  Neurological: Alert oriented x 4, grossly non-focal Psychological:  Alert and cooperative. Normal mood and affect  ASSESSMENT AND PLAN: -Diarrhea:  Chronic for at least a year.  ? Side effect of Dexilant vs bile salt related diarrhea vs IBS-D.  Colonoscopy with random biopsies, celiac studies, TSH all unremarkable.  Will start by discontinuing her Dexilant and switching to pantoprazole 40 mg daily for couple of weeks. We will get an update from her on her symptoms at that point and if there seems to be no improvement, then we can consider trial of Questran initially once daily and could be increased to twice a day if needed. Could also consider course of Xifaxan or even trial of Viberzi.  Will obtain procedure records from [facility].  CC:  Etta GrandchildJones, Thomas L, MD

## 2015-06-04 NOTE — Progress Notes (Signed)
i agree with the above note, paln 

## 2015-06-16 ENCOUNTER — Telehealth: Payer: Self-pay | Admitting: Emergency Medicine

## 2015-06-16 NOTE — Telephone Encounter (Signed)
Called patient as a follow-up to her appointment to see if she was feeling any better. She states that she is still having diarrhea and doesn't feel any different. Please advise any medication changes.

## 2015-06-21 ENCOUNTER — Ambulatory Visit: Payer: Medicare Other | Admitting: Internal Medicine

## 2015-06-22 ENCOUNTER — Ambulatory Visit (INDEPENDENT_AMBULATORY_CARE_PROVIDER_SITE_OTHER): Payer: Medicare Other | Admitting: Internal Medicine

## 2015-06-22 ENCOUNTER — Encounter: Payer: Self-pay | Admitting: Internal Medicine

## 2015-06-22 VITALS — BP 120/80 | HR 74 | Temp 98.1°F | Resp 16 | Ht 62.0 in | Wt 244.0 lb

## 2015-06-22 DIAGNOSIS — M5416 Radiculopathy, lumbar region: Secondary | ICD-10-CM | POA: Diagnosis not present

## 2015-06-22 DIAGNOSIS — M170 Bilateral primary osteoarthritis of knee: Secondary | ICD-10-CM | POA: Diagnosis not present

## 2015-06-22 DIAGNOSIS — G43909 Migraine, unspecified, not intractable, without status migrainosus: Secondary | ICD-10-CM | POA: Diagnosis not present

## 2015-06-22 DIAGNOSIS — I10 Essential (primary) hypertension: Secondary | ICD-10-CM

## 2015-06-22 DIAGNOSIS — Z1231 Encounter for screening mammogram for malignant neoplasm of breast: Secondary | ICD-10-CM | POA: Insufficient documentation

## 2015-06-22 MED ORDER — SUMATRIPTAN SUCCINATE 100 MG PO TABS: 100.0000 mg | ORAL_TABLET | ORAL | Status: DC | PRN

## 2015-06-22 MED ORDER — OXYCODONE-ACETAMINOPHEN 7.5-325 MG PO TABS: 1.0000 | ORAL_TABLET | ORAL | Status: DC | PRN

## 2015-06-22 NOTE — Progress Notes (Signed)
Subjective:  Patient ID: Danielle LimeConstance D Holt, female    DOB: 02/02/1956  Age: 59 y.o. MRN: [id number]  CC: Osteoarthritis; Hypertension; and Hypothyroidism   HPI Danielle Holt presents for follow-up, she asked me to refer her to an orthopedic surgery to see if she needs to have surgery on her left knee. She also complains of chronic recurring headaches and wants an alternative to Fioricet.  Outpatient Prescriptions Prior to Visit  Medication Sig Dispense Refill  . albuterol (PROAIR HFA) 108 (90 BASE) MCG/ACT inhaler Inhale 2 puffs into the lungs every 6 (six) hours as needed for wheezing. 1 Inhaler 11  . amLODipine (NORVASC) 2.5 MG tablet TAKE 1 TABLET BY MOUTH EVERY DAY 90 tablet 0  . atorvastatin (LIPITOR) 20 MG tablet TAKE 1 TABLET BY MOUTH EVERY NIGHT AT BEDTIME 90 tablet 0  . buPROPion (WELLBUTRIN XL) 150 MG 24 hr tablet TK 2 TS PO QD  1  . clonazePAM (KLONOPIN) 1 MG tablet     . dexlansoprazole (DEXILANT) 60 MG capsule Take 60 mg by mouth daily.    . ferrous sulfate 325 (65 FE) MG tablet Take 1 tablet (325 mg total) by mouth daily with breakfast. 90 tablet 3  . Fluticasone-Salmeterol (ADVAIR DISKUS) 250-50 MCG/DOSE AEPB Inhale 1 puff into the lungs 2 (two) times daily. 60 each 11  . Levothyroxine Sodium 13 MCG CAPS Take 1 capsule (13 mcg total) by mouth daily. 30 capsule 5  . losartan (COZAAR) 50 MG tablet Take 1 tablet (50 mg total) by mouth daily. 90 tablet 3  . metoprolol succinate (TOPROL-XL) 50 MG 24 hr tablet TAKE 1 TABLET BY MOUTH DAILY 90 tablet 0  . nortriptyline (PAMELOR) 10 MG capsule Take 1 capsule (10 mg total) by mouth at bedtime. 90 capsule 3  . pantoprazole (PROTONIX) 40 MG tablet TAKE 1 TABLET(40 MG) BY MOUTH DAILY 90 tablet 0  . potassium chloride SA (K-DUR,KLOR-CON) 20 MEQ tablet TK 2 TS PO QD  1  . tiZANidine (ZANAFLEX) 4 MG tablet Take 1 tablet (4 mg total) by mouth 2 (two) times daily as needed. 60 tablet 11  . triamterene-hydrochlorothiazide (MAXZIDE-25)  37.5-25 MG per tablet TK 1 T PO QD  2  . ziprasidone (GEODON) 40 MG capsule TAKE 1 CAPSULE BY MOUTH AT BEDTIME 90 capsule 3  . oxyCODONE-acetaminophen (PERCOCET) 7.5-325 MG tablet Take 1 tablet by mouth every 4 (four) hours as needed. 90 tablet 0   No facility-administered medications prior to visit.    ROS Review of Systems  Constitutional: Positive for fatigue. Negative for fever, chills, diaphoresis and appetite change.  Eyes: Negative.   Respiratory: Negative.  Negative for cough, choking, chest tightness, shortness of breath and stridor.   Cardiovascular: Negative.  Negative for chest pain, palpitations and leg swelling.  Gastrointestinal: Negative.  Negative for nausea, vomiting, abdominal pain, diarrhea, constipation and blood in stool.  Endocrine: Negative.   Genitourinary: Negative.   Musculoskeletal: Positive for back pain and arthralgias. Negative for myalgias and neck pain.  Skin: Negative.  Negative for color change.  Allergic/Immunologic: Negative.   Neurological: Positive for headaches. Negative for dizziness, tremors, seizures, syncope, facial asymmetry, speech difficulty, weakness, light-headedness and numbness.  Hematological: Negative.  Negative for adenopathy. Does not bruise/bleed easily.  Psychiatric/Behavioral: Negative.     Objective:  BP 120/80 mmHg  Pulse 74  Temp(Src) 98.1 F (36.7 C) (Oral)  Resp 16  Ht 5\' 2"  (1.575 m)  Wt 244 lb (110.678 kg)  BMI 44.62 kg/m2  SpO2 97%  BP Readings from Last 3 Encounters:  06/22/15 120/80  06/02/15 110/78  05/06/15 120/70    Wt Readings from Last 3 Encounters:  06/22/15 244 lb (110.678 kg)  06/02/15 247 lb (112.038 kg)  05/06/15 243 lb (110.224 kg)    Physical Exam  Constitutional: She is oriented to person, place, and time. No distress.  HENT:  Mouth/Throat: Oropharynx is clear and moist. No oropharyngeal exudate.  Eyes: Conjunctivae are normal. Right eye exhibits no discharge. Left eye exhibits no  discharge. No scleral icterus.  Neck: Normal range of motion. Neck supple. No JVD present. No tracheal deviation present. No thyromegaly present.  Cardiovascular: Normal rate, normal heart sounds and intact distal pulses.  Exam reveals no gallop and no friction rub.   No murmur heard. Pulmonary/Chest: Effort normal and breath sounds normal. No stridor. No respiratory distress. She has no wheezes. She has no rales. She exhibits no tenderness.  Abdominal: Soft. Bowel sounds are normal. She exhibits no distension and no mass. There is no tenderness. There is no rebound and no guarding.  Musculoskeletal: Normal range of motion. She exhibits no edema.  Lymphadenopathy:    She has no cervical adenopathy.  Neurological: She is oriented to person, place, and time.  Skin: Skin is warm and dry. No rash noted. She is not diaphoretic. No erythema. No pallor.    Lab Results  Component Value Date   WBC 5.9 05/06/2015   HGB 12.0 05/06/2015   HCT 36.7 05/06/2015   PLT 225.0 05/06/2015   GLUCOSE 112* 05/06/2015   GLUCOSE 112* 05/06/2015   CHOL 178 05/06/2015   TRIG 165.0* 05/06/2015   HDL 62.70 05/06/2015   LDLDIRECT 105.9 10/29/2012   LDLCALC 82 05/06/2015   ALT 21 05/06/2015   AST 21 05/06/2015   NA 141 05/06/2015   NA 141 05/06/2015   K 4.2 05/06/2015   K 4.2 05/06/2015   CL 109 05/06/2015   CL 109 05/06/2015   CREATININE 0.90 05/06/2015   CREATININE 0.90 05/06/2015   BUN 13 05/06/2015   BUN 13 05/06/2015   CO2 24 05/06/2015   CO2 24 05/06/2015   TSH 1.81 05/06/2015   HGBA1C 5.6 05/06/2015    Dg Foot Complete Right  05/14/2012  *RADIOLOGY REPORT* Clinical Data: Right foot pain for 1 month RIGHT FOOT COMPLETE - 3+ VIEW Comparison: None Findings: Bone mineralization normal. Joint spaces preserved. No fracture, dislocation, or bone destruction. IMPRESSION: Normal exam. Original Report Authenticated By: Ulyses Southward, M.D.    Assessment & Plan:   Danielle Holt was seen today for  osteoarthritis, hypertension and hypothyroidism.  Diagnoses and all orders for this visit:  Essential hypertension, benign- her pressures well controlled  Left lumbar radiculitis -     oxyCODONE-acetaminophen (PERCOCET) 7.5-325 MG tablet; Take 1 tablet by mouth every 4 (four) hours as needed.  Primary osteoarthritis of both knees -     oxyCODONE-acetaminophen (PERCOCET) 7.5-325 MG tablet; Take 1 tablet by mouth every 4 (four) hours as needed. -     Ambulatory referral to Orthopedic Surgery  Migraine without status migrainosus, not intractable, unspecified migraine type- I've asked her to be seen by neurology for her headaches, in the meantime I have asked her try Imitrex for symptom relief -     Ambulatory referral to Neurology -     SUMAtriptan (IMITREX) 100 MG tablet; Take 1 tablet (100 mg total) by mouth every 2 (two) hours as needed for migraine. May repeat in 2 hours if headache persists  or recurs.  Visit for screening mammogram -     MM DIGITAL SCREENING BILATERAL; Future  I am having Ms. Okelly start on SUMAtriptan. I am also having her maintain her Fluticasone-Salmeterol, dexlansoprazole, albuterol, tiZANidine, Levothyroxine Sodium, ferrous sulfate, losartan, ziprasidone, triamterene-hydrochlorothiazide, potassium chloride SA, nortriptyline, metoprolol succinate, atorvastatin, amLODipine, clonazePAM, buPROPion, pantoprazole, busPIRone, and oxyCODONE-acetaminophen.  Meds ordered this encounter  Medications  . busPIRone (BUSPAR) 10 MG tablet    Sig: Take 1/2 tab 3 times a day for a week, then 1 tab 3 times a day  . oxyCODONE-acetaminophen (PERCOCET) 7.5-325 MG tablet    Sig: Take 1 tablet by mouth every 4 (four) hours as needed.    Dispense:  90 tablet    Refill:  0    Fill on or after 07/20/15  . SUMAtriptan (IMITREX) 100 MG tablet    Sig: Take 1 tablet (100 mg total) by mouth every 2 (two) hours as needed for migraine. May repeat in 2 hours if headache persists or recurs.     Dispense:  10 tablet    Refill:  3     Follow-up: Return in about 6 months (around 12/21/2015).  Sanda Linger, MD

## 2015-06-22 NOTE — Patient Instructions (Signed)

## 2015-06-22 NOTE — Progress Notes (Signed)
Pre visit review using our clinic review tool, if applicable. No additional management support is needed unless otherwise documented below in the visit note. 

## 2015-06-28 ENCOUNTER — Other Ambulatory Visit: Payer: Self-pay | Admitting: Internal Medicine

## 2015-07-07 ENCOUNTER — Telehealth: Payer: Self-pay | Admitting: Gastroenterology

## 2015-07-07 NOTE — Telephone Encounter (Signed)
Dr. Larae GroomsJacob's  Please see Jessica's note from 06/02/15.  Patient is still having diarrhea despite PPI change.  Jessica's note stated she would consider a questran trial.  Is it ok to send this in?

## 2015-07-08 MED ORDER — CHOLESTYRAMINE 4 G PO PACK: 4.0000 g | PACK | Freq: Every day | ORAL | Status: DC

## 2015-07-08 NOTE — Telephone Encounter (Signed)
Yes, please start questran 4gm po once daily, disp one month with 6 refills, she should call to report on her response in 4-6 weeks.  tahnks

## 2015-07-08 NOTE — Telephone Encounter (Signed)
Patient notified She will call back with an update

## 2015-07-21 ENCOUNTER — Other Ambulatory Visit: Payer: Self-pay

## 2015-07-21 MED ORDER — METOPROLOL SUCCINATE ER 50 MG PO TB24: 50.0000 mg | ORAL_TABLET | Freq: Every day | ORAL | Status: DC

## 2015-07-27 ENCOUNTER — Ambulatory Visit (INDEPENDENT_AMBULATORY_CARE_PROVIDER_SITE_OTHER): Payer: Medicare Other | Admitting: Neurology

## 2015-07-27 ENCOUNTER — Other Ambulatory Visit: Payer: Self-pay | Admitting: Neurology

## 2015-07-27 ENCOUNTER — Encounter: Payer: Self-pay | Admitting: Neurology

## 2015-07-27 VITALS — BP 126/80 | HR 79 | Ht 62.0 in | Wt 235.0 lb

## 2015-07-27 DIAGNOSIS — G4441 Drug-induced headache, not elsewhere classified, intractable: Secondary | ICD-10-CM | POA: Diagnosis not present

## 2015-07-27 DIAGNOSIS — F17200 Nicotine dependence, unspecified, uncomplicated: Secondary | ICD-10-CM

## 2015-07-27 DIAGNOSIS — G4440 Drug-induced headache, not elsewhere classified, not intractable: Secondary | ICD-10-CM

## 2015-07-27 DIAGNOSIS — Z79899 Other long term (current) drug therapy: Secondary | ICD-10-CM

## 2015-07-27 DIAGNOSIS — G43709 Chronic migraine without aura, not intractable, without status migrainosus: Secondary | ICD-10-CM

## 2015-07-27 MED ORDER — NAPROXEN 500 MG PO TABS: ORAL_TABLET | ORAL | Status: DC

## 2015-07-27 MED ORDER — NORTRIPTYLINE HCL 25 MG PO CAPS: 25.0000 mg | ORAL_CAPSULE | Freq: Every day | ORAL | Status: DC

## 2015-07-27 NOTE — Patient Instructions (Addendum)
Migraine Recommendations: 1.  Increase nortriptyline  tablets to 2 tablets ( ) at bedtime.  When you run out, start nortriptyline  at bedtime.  Call in 4 weeks with update and we can increase dose further if needed. 2.  Take sumatriptan  with naproxen  at earliest onset of headache.  May repeat doses once in 2 hours if needed.  Do not exceed two doses in 24 hours. 3.  Limit use of pain relievers to no more than 2 days out of the week.  These medications include acetaminophen, ibuprofen, triptans and narcotics.  This will help reduce risk of rebound headaches.  I don't recommend Fioricet for frequent headaches.  Limit use of percocet for back and knee pain. 4.  Be aware of common food triggers such as processed sweets, processed foods with nitrites (such as deli meat, hot dogs, sausages), foods with MSG, alcohol (such as wine), chocolate, certain cheeses, certain fruits (dried fruits, some citrus fruit), vinegar, diet soda. 4.  Avoid caffeine 5.  Routine exercise 6.  Proper sleep hygiene 7.  Stay adequately hydrated with water 8.  Keep a headache diary. 9.  Maintain proper stress management. 10.  Do not skip meals. 11.  Consider supplements:  Magnesium oxide  to  daily, riboflavin , Coenzyme Q 10  three times daily 12.  Follow up in 3 months. 13.  We will also check an EKG because combination of nortriptyline and Geodon may cause arrhythmias.

## 2015-07-27 NOTE — Progress Notes (Signed)
NEUROLOGY CONSULTATION NOTE  Danielle Holt MRN: [id number] DOB: 1956/03/22  Referring provider: Dr. Yetta Barre Primary care provider: Dr. Yetta Barre  Reason for consult:  migraines  HISTORY OF PRESENT ILLNESS: Danielle Holt is a 60 year old right-handed female with hypertension, osteoarthritis, depression, morbid obesity and tobacco use who presents for migraine.  History obtained by patient and PCP note.  Onset:  Over 20 years Location:  Bi-frontal, bi-temporal Quality:  pounding Intensity:  8/10 Aura:  no Prodrome:  no Associated symptoms:  Sometimes nausea or photophobia Duration:  30 to 60 minutes with Fioricet, otherwise all day Frequency:  Varies (some months daily, other months 3 days) Triggers/exacerbating factors:  unknown Relieving factors:  Laying down to rest Activity:  Lays down  Past abortive medication:  Fioricet (has been taking daily until recently when she changed PCP), ibuprofen, Excedrin, Tylenol Past preventative medication:  Amitriptyline, topamax, venlafaxine (side effects), gabapentin, Depakote  Current abortive medication:  sumatriptan  Antihypertensive medications:  metoprolol succinate , amlodipine, losartan Antidepressant medications:  nortriptyline , bupropion, ziprasidone Anticonvulsant medications:  none Vitamins/Herbal/Supplements:  B12, iron Other medication:  Percocet takes daily for back and knee pain, BuSpar  Caffeine:  seldom Alcohol:  no Smoker:  Yes but currently trying to quit Diet:  Needs to improve Exercise:  Not routine Depression/stress:  controlled Sleep hygiene:  good Family history of headache:  adopted  PAST MEDICAL HISTORY: Past Medical History  Diagnosis Date  . B12 DEFICIENCY   . HYPERLIPIDEMIA   . Morbid obesity (HCC)   . TOBACCO USE   . Depressive disorder, not elsewhere classified   . CLUSTER HEADACHE   . ALLERGIC RHINITIS   . ASTHMA   . COPD   . GERD   . ARTHRITIS   . INSOMNIA   .  Hypertension   . HYPOKALEMIA, MILD   . Anemia   . Anxiety   . Gallstones     PAST SURGICAL HISTORY: Past Surgical History  Procedure Laterality Date  . Cholecystectomy  1991  . Knee arthroscopy Bilateral   . Tonsillectomy    . Carpal tunnel release Bilateral   . Appendectomy      MEDICATIONS: Current Outpatient Prescriptions on File Prior to Visit  Medication Sig Dispense Refill  . albuterol (PROAIR HFA) 108 (90 BASE) MCG/ACT inhaler Inhale 2 puffs into the lungs every 6 (six) hours as needed for wheezing. 1 Inhaler 11  . amLODipine (NORVASC) 2.5 MG tablet TAKE 1 TABLET BY MOUTH EVERY DAY 90 tablet 3  . atorvastatin (LIPITOR) 20 MG tablet TAKE 1 TABLET BY MOUTH EVERY NIGHT AT BEDTIME 90 tablet 3  . buPROPion (WELLBUTRIN XL) 150 MG 24 hr tablet TK 2 TS PO QD  1  . busPIRone (BUSPAR) 10 MG tablet Take 1/2 tab 3 times a day for a week, then 1 tab 3 times a day    . cholestyramine (QUESTRAN) 4 g packet Take 1 packet (4 g total) by mouth daily. 30 each 6  . clonazePAM (KLONOPIN) 1 MG tablet     . dexlansoprazole (DEXILANT) 60 MG capsule Take 60 mg by mouth daily.    . ferrous sulfate 325 (65 FE) MG tablet Take 1 tablet (325 mg total) by mouth daily with breakfast. 90 tablet 3  . Fluticasone-Salmeterol (ADVAIR DISKUS) 250-50 MCG/DOSE AEPB Inhale 1 puff into the lungs 2 (two) times daily. 60 each 11  . losartan (COZAAR) 50 MG tablet Take 1 tablet (50 mg total) by mouth daily. 90 tablet 3  .  metoprolol succinate (TOPROL-XL) 50 MG 24 hr tablet Take 1 tablet (50 mg total) by mouth daily. Take with or immediately following a meal. 90 tablet 2  . oxyCODONE-acetaminophen (PERCOCET) 7.5-325 MG tablet Take 1 tablet by mouth every 4 (four) hours as needed. 90 tablet 0  . pantoprazole (PROTONIX) 40 MG tablet TAKE 1 TABLET(40 MG) BY MOUTH DAILY 90 tablet 0  . potassium chloride SA (K-DUR,KLOR-CON) 20 MEQ tablet TK 2 TS PO QD  1  . SUMAtriptan (IMITREX) 100 MG tablet Take 1 tablet (100 mg total) by  mouth every 2 (two) hours as needed for migraine. May repeat in 2 hours if headache persists or recurs. 10 tablet 3  . tiZANidine (ZANAFLEX) 4 MG tablet Take 1 tablet (4 mg total) by mouth 2 (two) times daily as needed. 60 tablet 11  . triamterene-hydrochlorothiazide (MAXZIDE-25) 37.5-25 MG per tablet TK 1 T PO QD  2  . ziprasidone (GEODON) 40 MG capsule TAKE 1 CAPSULE BY MOUTH AT BEDTIME 90 capsule 3  . Levothyroxine Sodium 13 MCG CAPS Take 1 capsule (13 mcg total) by mouth daily. (Patient not taking: Reported on 07/27/2015) 30 capsule 5   No current facility-administered medications on file prior to visit.    ALLERGIES: Allergies  Allergen Reactions  . Fentanyl Other (See Comments)    Hallucinations per pt  . Zolpidem     Other reaction(s): Other (See Comments) Hallucinations.  . Ativan [Lorazepam]     Memory loss  . Hyoscyamine Swelling  . Varenicline     REACTION: amnesia  . Varenicline Tartrate     REACTION: amnesia  . Zolpidem Tartrate     REACTION: Short term memory loss  . Viibryd [employer]]     Visual disturbance    FAMILY HISTORY: Family History  Problem Relation Age of Onset  . Adopted: Yes    SOCIAL HISTORY: Social History   Social History  . Marital Status: Single    Spouse Name: N/A  . Number of Children: N/A  . Years of Education: N/A   Occupational History  . Not on file.   Social History Main Topics  . Smoking status: Current Some Day Smoker -- 0.50 packs/day for 40 years    Types: Cigarettes    Last Attempt to Quit: 07/16/2011  . Smokeless tobacco: Never Used  . Alcohol Use: No  . Drug Use: No  . Sexual Activity: Yes   Other Topics Concern  . Not on file   Social History Narrative    REVIEW OF SYSTEMS: Constitutional: No fevers, chills, or sweats, no generalized fatigue, change in appetite Eyes: No visual changes, double vision, eye pain Ear, nose and throat: No hearing loss, ear pain, nasal congestion, sore  throat Cardiovascular: No chest pain, palpitations Respiratory:  No shortness of breath at rest or with exertion, wheezes GastrointestinaI: No nausea, vomiting, diarrhea, abdominal pain, fecal incontinence Genitourinary:  No dysuria, urinary retention or frequency Musculoskeletal:  No neck pain, back pain Integumentary: No rash, pruritus, skin lesions Neurological: as above Psychiatric: No depression, insomnia, anxiety Endocrine: No palpitations, fatigue, diaphoresis, mood swings, change in appetite, change in weight, increased thirst Hematologic/Lymphatic:  No anemia, purpura, petechiae. Allergic/Immunologic: no itchy/runny eyes, nasal congestion, recent allergic reactions, rashes  PHYSICAL EXAM: Filed Vitals:   07/27/15 0828  BP: 126/80  Pulse: 79   General: No acute distress.  Head:  Normocephalic/atraumatic Eyes:  fundi unremarkable, without vessel changes, exudates, hemorrhages or papilledema. Neck: supple, no paraspinal tenderness, full range of motion  Back: No paraspinal tenderness Heart: regular rate and rhythm Lungs: Clear to auscultation bilaterally. Vascular: No carotid bruits. Neurological Exam: Mental status: alert and oriented to person, place, and time, recent and remote memory intact, fund of knowledge intact, attention and concentration intact, speech fluent and not dysarthric, language intact. Cranial nerves: CN I: not tested CN II: pupils equal, round and reactive to light, visual fields intact, fundi unremarkable, without vessel changes, exudates, hemorrhages or papilledema. CN III, IV, VI:  full range of motion, no nystagmus, no ptosis CN V: facial sensation intact CN VII: upper and lower face symmetric CN VIII: hearing intact CN IX, X: gag intact, uvula midline CN XI: sternocleidomastoid and trapezius muscles intact CN XII: tongue midline Bulk & Tone: normal, no fasciculations. Motor:  5/5 throughout  Sensation: temperature and vibration sensation  intact. Deep Tendon Reflexes:  1+ throughout, toes downgoing.  Finger to nose testing:  Without dysmetria.  Heel to shin:  Without dysmetria.  Gait:  Normal station and stride.  Able to turn and tandem walk. Romberg negative.  IMPRESSION: Chronic migraine without aura, complicated by medication overuse headache Tobacco use Morbid obesity  PLAN: 1.  Increase nortriptyline to  at bedtime (will check EKG) 2.  Try sumatriptan with naproxen  3.  Stop Fioricet.  Limit use of pain relievers to no more than 2 days out of the week. 4.  Lifestyle modification, including smoking cessation and weight loss 5.  Follow up in 3 months  Thank you for allowing me to take part in the care of this patient.  Shon Millet, DO  CC:  Sanda Linger, MD

## 2015-07-29 ENCOUNTER — Ambulatory Visit (INDEPENDENT_AMBULATORY_CARE_PROVIDER_SITE_OTHER): Payer: Medicare Other | Admitting: Internal Medicine

## 2015-07-29 ENCOUNTER — Encounter: Payer: Self-pay | Admitting: Internal Medicine

## 2015-07-29 VITALS — BP 120/82 | HR 73 | Temp 97.3°F | Resp 16 | Ht 62.0 in | Wt 237.0 lb

## 2015-07-29 DIAGNOSIS — M5416 Radiculopathy, lumbar region: Secondary | ICD-10-CM | POA: Diagnosis not present

## 2015-07-29 DIAGNOSIS — M170 Bilateral primary osteoarthritis of knee: Secondary | ICD-10-CM

## 2015-07-29 DIAGNOSIS — I10 Essential (primary) hypertension: Secondary | ICD-10-CM

## 2015-07-29 MED ORDER — OXYCODONE-ACETAMINOPHEN 7.5-325 MG PO TABS: 1.0000 | ORAL_TABLET | ORAL | Status: DC | PRN

## 2015-07-29 NOTE — Progress Notes (Signed)
Subjective:  Patient ID: Danielle Holt, female    DOB: 07-Sep-1955  Age: 60 y.o. MRN: [id number]  CC: Hypertension   HPI JET TRAYNHAM presents for a follow-up visit, she offers no new complaints today. She was sent here by her neurologist who wants to increase her nortriptyline dose. He wants to make sure her EKG does not show any complications in the QT interval that would contraindicate the use of higher doses of nortriptyline.  Outpatient Prescriptions Prior to Visit  Medication Sig Dispense Refill  . albuterol (PROAIR HFA) 108 (90 BASE) MCG/ACT inhaler Inhale 2 puffs into the lungs every 6 (six) hours as needed for wheezing. 1 Inhaler 11  . amLODipine (NORVASC) 2.5 MG tablet TAKE 1 TABLET BY MOUTH EVERY DAY 90 tablet 3  . atorvastatin (LIPITOR) 20 MG tablet TAKE 1 TABLET BY MOUTH EVERY NIGHT AT BEDTIME 90 tablet 3  . buPROPion (WELLBUTRIN XL) 150 MG 24 hr tablet TK 2 TS PO QD  1  . busPIRone (BUSPAR) 10 MG tablet Take 1/2 tab 3 times a day for a week, then 1 tab 3 times a day    . cholestyramine (QUESTRAN) 4 g packet Take 1 packet (4 g total) by mouth daily. 30 each 6  . clonazePAM (KLONOPIN) 1 MG tablet     . dexlansoprazole (DEXILANT) 60 MG capsule Take 60 mg by mouth daily.    . ferrous sulfate 325 (65 FE) MG tablet Take 1 tablet (325 mg total) by mouth daily with breakfast. 90 tablet 3  . Fluticasone-Salmeterol (ADVAIR DISKUS) 250-50 MCG/DOSE AEPB Inhale 1 puff into the lungs 2 (two) times daily. 60 each 11  . Levothyroxine Sodium 13 MCG CAPS Take 1 capsule (13 mcg total) by mouth daily. (Patient not taking: Reported on 07/27/2015) 30 capsule 5  . losartan (COZAAR) 50 MG tablet Take 1 tablet (50 mg total) by mouth daily. 90 tablet 3  . metoprolol succinate (TOPROL-XL) 50 MG 24 hr tablet Take 1 tablet (50 mg total) by mouth daily. Take with or immediately following a meal. 90 tablet 2  . naproxen (NAPROSYN) 500 MG tablet Take as directed with sumatriptan 10 tablet 2  .  nortriptyline (PAMELOR) 25 MG capsule TAKE 1 CAPSULE(25 MG) BY MOUTH AT BEDTIME 90 capsule 0  . pantoprazole (PROTONIX) 40 MG tablet TAKE 1 TABLET(40 MG) BY MOUTH DAILY 90 tablet 0  . potassium chloride SA (K-DUR,KLOR-CON) 20 MEQ tablet TK 2 TS PO QD  1  . SUMAtriptan (IMITREX) 100 MG tablet Take 1 tablet (100 mg total) by mouth every 2 (two) hours as needed for migraine. May repeat in 2 hours if headache persists or recurs. 10 tablet 3  . tiZANidine (ZANAFLEX) 4 MG tablet Take 1 tablet (4 mg total) by mouth 2 (two) times daily as needed. 60 tablet 11  . triamterene-hydrochlorothiazide (MAXZIDE-25) 37.5-25 MG per tablet TK 1 T PO QD  2  . ziprasidone (GEODON) 40 MG capsule TAKE 1 CAPSULE BY MOUTH AT BEDTIME 90 capsule 3  . oxyCODONE-acetaminophen (PERCOCET) 7.5-325 MG tablet Take 1 tablet by mouth every 4 (four) hours as needed. 90 tablet 0   No facility-administered medications prior to visit.    ROS Review of Systems  Constitutional: Negative.  Negative for fever, chills, diaphoresis, appetite change and fatigue.  HENT: Negative.   Eyes: Negative.  Negative for visual disturbance.  Respiratory: Negative.  Negative for cough, choking, chest tightness, shortness of breath and stridor.   Cardiovascular: Negative.  Negative for  chest pain, palpitations and leg swelling.  Gastrointestinal: Negative.  Negative for abdominal pain.  Endocrine: Negative.   Genitourinary: Negative.   Musculoskeletal: Positive for back pain and arthralgias. Negative for gait problem and neck pain.  Skin: Negative.  Negative for color change and rash.  Allergic/Immunologic: Negative.   Neurological: Negative.  Negative for dizziness, syncope, weakness, light-headedness, numbness and headaches.  Hematological: Negative.  Negative for adenopathy. Does not bruise/bleed easily.  Psychiatric/Behavioral: Negative.     Objective:  BP 120/82 mmHg  Pulse 73  Temp(Src) 97.3 F (36.3 C) (Oral)  Resp 16  Ht   (1.575 m)  Wt 237 lb (107.502 kg)  BMI 43.34 kg/m2  SpO2 99%  BP Readings from Last 3 Encounters:  07/29/15 120/82  07/27/15 126/80  06/22/15 120/80    Wt Readings from Last 3 Encounters:  07/29/15 237 lb (107.502 kg)  07/27/15 235 lb (106.595 kg)  06/22/15 244 lb (110.678 kg)    Physical Exam  Constitutional: She is oriented to person, place, and time. No distress.  HENT:  Mouth/Throat: Oropharynx is clear and moist. No oropharyngeal exudate.  Eyes: Conjunctivae are normal. Right eye exhibits no discharge. Left eye exhibits no discharge. No scleral icterus.  Neck: Normal range of motion. Neck supple. No JVD present. No tracheal deviation present. No thyromegaly present.  Cardiovascular: Normal rate, regular rhythm, normal heart sounds and intact distal pulses.  Exam reveals no gallop and no friction rub.   No murmur heard. EKG -  Sinus  Rhythm  -  Negative precordial T-waves  -Probably normal -consider anteroseptal ischemia.   PROBABLY NORMAL FOR AGE  No changes from prior EKG  Pulmonary/Chest: Effort normal and breath sounds normal. No stridor. No respiratory distress. She has no wheezes. She has no rales. She exhibits no tenderness.  Abdominal: Soft. Bowel sounds are normal. She exhibits no distension and no mass. There is no tenderness. There is no rebound and no guarding.  Musculoskeletal: Normal range of motion. She exhibits no edema or tenderness.  Lymphadenopathy:    She has no cervical adenopathy.  Neurological: She is oriented to person, place, and time.  Skin: Skin is warm and dry. No rash noted. She is not diaphoretic. No erythema. No pallor.  Vitals reviewed.   Lab Results  Component Value Date   WBC 5.9 05/06/2015   HGB 12.0 05/06/2015   HCT 36.7 05/06/2015   PLT 225.0 05/06/2015   GLUCOSE 112* 05/06/2015   GLUCOSE 112* 05/06/2015   CHOL 178 05/06/2015   TRIG 165.0* 05/06/2015   HDL 62.70 05/06/2015   LDLDIRECT 105.9 10/29/2012   LDLCALC 82  05/06/2015   ALT 21 05/06/2015   AST 21 05/06/2015   NA 141 05/06/2015   NA 141 05/06/2015   K 4.2 05/06/2015   K 4.2 05/06/2015   CL 109 05/06/2015   CL 109 05/06/2015   CREATININE 0.90 05/06/2015   CREATININE 0.90 05/06/2015   BUN 13 05/06/2015   BUN 13 05/06/2015   CO2 24 05/06/2015   CO2 24 05/06/2015   TSH 1.81 05/06/2015   HGBA1C 5.6 05/06/2015    Dg Foot Complete Right  05/14/2012  *RADIOLOGY REPORT* Clinical Data: Right foot pain for 1 month RIGHT FOOT COMPLETE - 3+ VIEW Comparison: None Findings: Bone mineralization normal. Joint spaces preserved. No fracture, dislocation, or bone destruction. IMPRESSION: Normal exam. Original Report Authenticated By: Ulyses Southward, M.D.    Assessment & Plan:   Robynne was seen today for hypertension.  Diagnoses and  all orders for this visit:  Left lumbar radiculitis -     Discontinue: oxyCODONE-acetaminophen (PERCOCET) 7.5-325 MG tablet; Take 1 tablet by mouth every 4 (four) hours as needed. -     oxyCODONE-acetaminophen (PERCOCET) 7.5-325 MG tablet; Take 1 tablet by mouth every 4 (four) hours as needed.  Essential hypertension, benign- her blood pressure is adequately well controlled, her EKG is unchanged from prior EKGs, all of her intervals are normal. There is no contraindication to an increased dose and nortriptyline. -     EKG 12-Lead  Primary osteoarthritis of both knees -     Discontinue: oxyCODONE-acetaminophen (PERCOCET) 7.5-325 MG tablet; Take 1 tablet by mouth every 4 (four) hours as needed. -     oxyCODONE-acetaminophen (PERCOCET) 7.5-325 MG tablet; Take 1 tablet by mouth every 4 (four) hours as needed.   I am having Ms. Stonebraker maintain her Fluticasone-Salmeterol, dexlansoprazole, albuterol, tiZANidine, Levothyroxine Sodium, ferrous sulfate, losartan, ziprasidone, triamterene-hydrochlorothiazide, potassium chloride SA, clonazePAM, buPROPion, pantoprazole, busPIRone, SUMAtriptan, amLODipine, atorvastatin, cholestyramine,  metoprolol succinate, naproxen, nortriptyline, and oxyCODONE-acetaminophen.  Meds ordered this encounter  Medications  . DISCONTD: oxyCODONE-acetaminophen (PERCOCET) 7.5-325 MG tablet    Sig: Take 1 tablet by mouth every 4 (four) hours as needed.    Dispense:  90 tablet    Refill:  0    Fill on or after 08/20/15  . oxyCODONE-acetaminophen (PERCOCET) 7.5-325 MG tablet    Sig: Take 1 tablet by mouth every 4 (four) hours as needed.    Dispense:  90 tablet    Refill:  0    Fill on or after 09/17/15     Follow-up: Return in about 4 months (around 11/26/2015).  Sanda Linger, MD

## 2015-07-29 NOTE — Patient Instructions (Signed)

## 2015-07-29 NOTE — Progress Notes (Signed)
Pre visit review using our clinic review tool, if applicable. No additional management support is needed unless otherwise documented below in the visit note. 

## 2015-08-11 ENCOUNTER — Ambulatory Visit
Admission: RE | Admit: 2015-08-11 | Discharge: 2015-08-11 | Disposition: A | Discharge status: Home / Self Care | Payer: Medicare Other | Source: Ambulatory Visit | Attending: Internal Medicine | Admitting: Internal Medicine

## 2015-08-11 DIAGNOSIS — Z1231 Encounter for screening mammogram for malignant neoplasm of breast: Secondary | ICD-10-CM

## 2015-08-11 IMAGING — MG MM SCREEN MAMMOGRAM BILATERAL
6 series · 6 of 6 positions shown · non-contrast
Comparison: Previous exam(s).

CLINICAL DATA: Screening.

EXAM:
DIGITAL SCREENING BILATERAL MAMMOGRAM WITH CAD

[R CC]
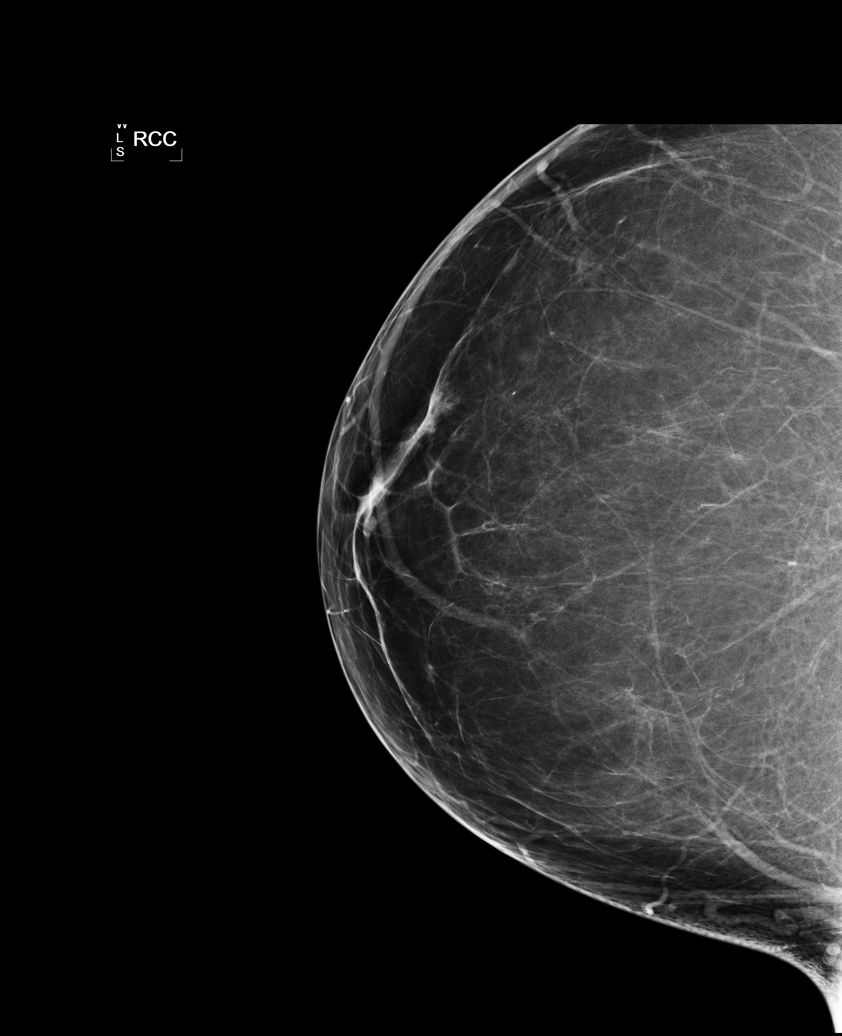

[L CC]
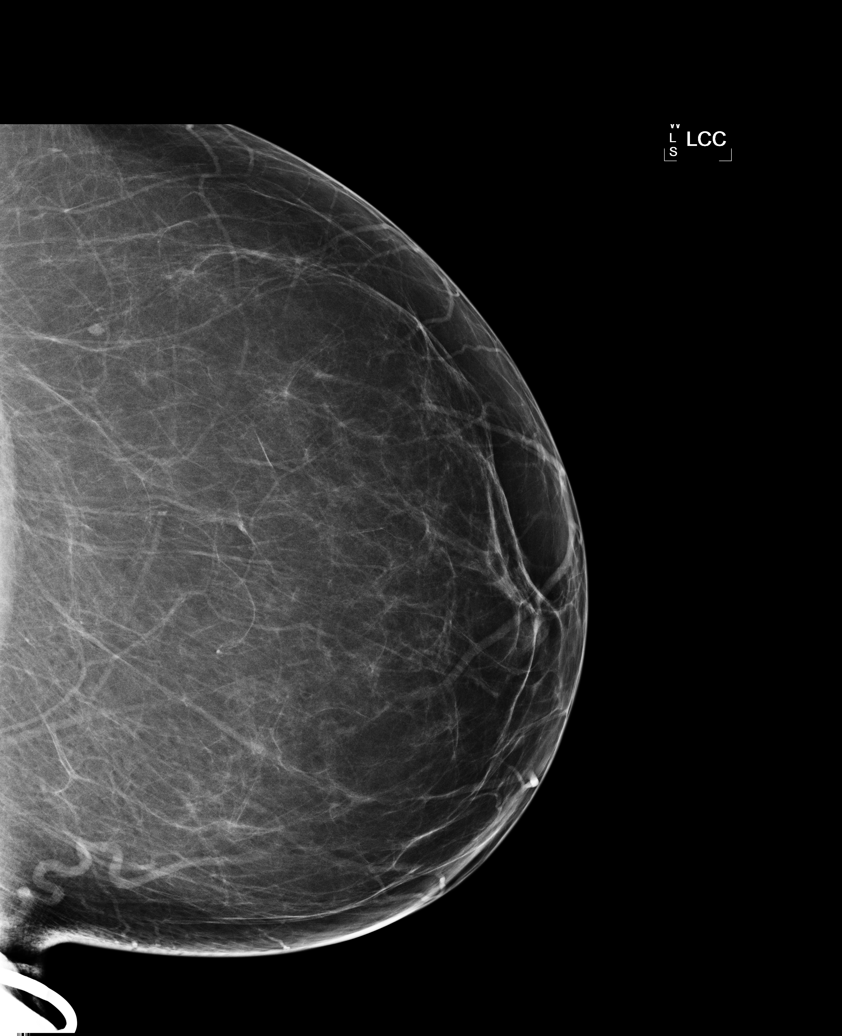

[L MLO (1 of 2)]
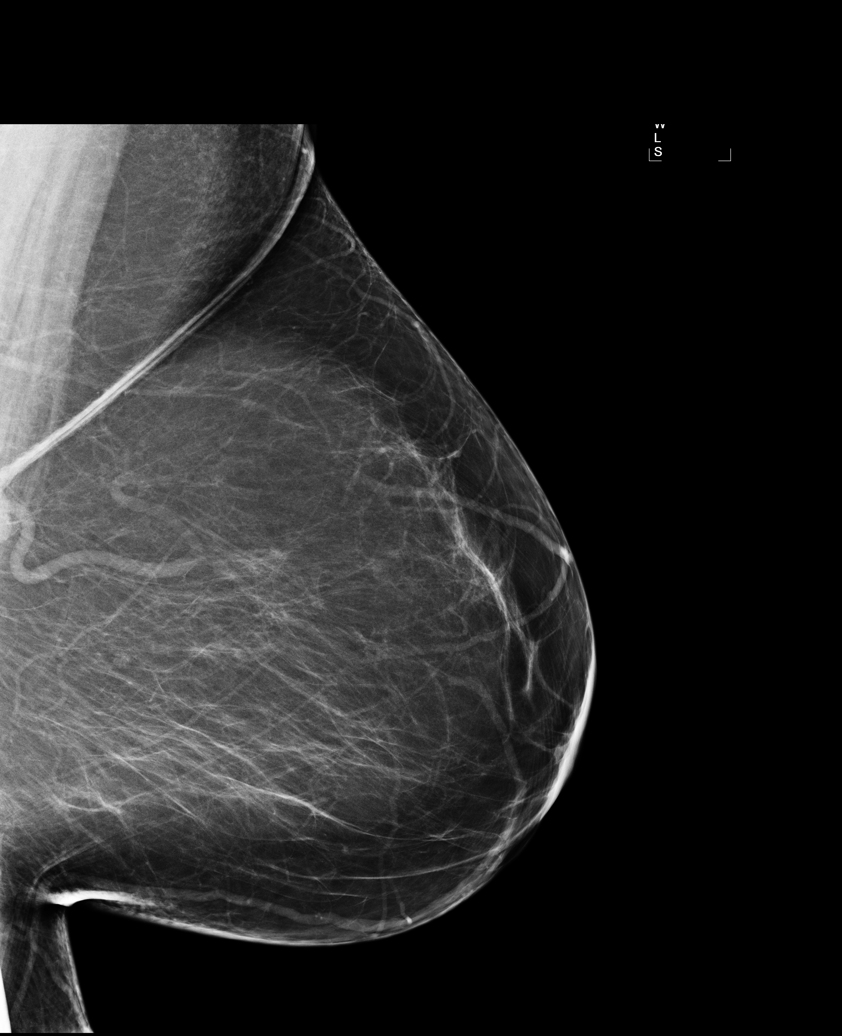

[R MLO (1 of 2)]
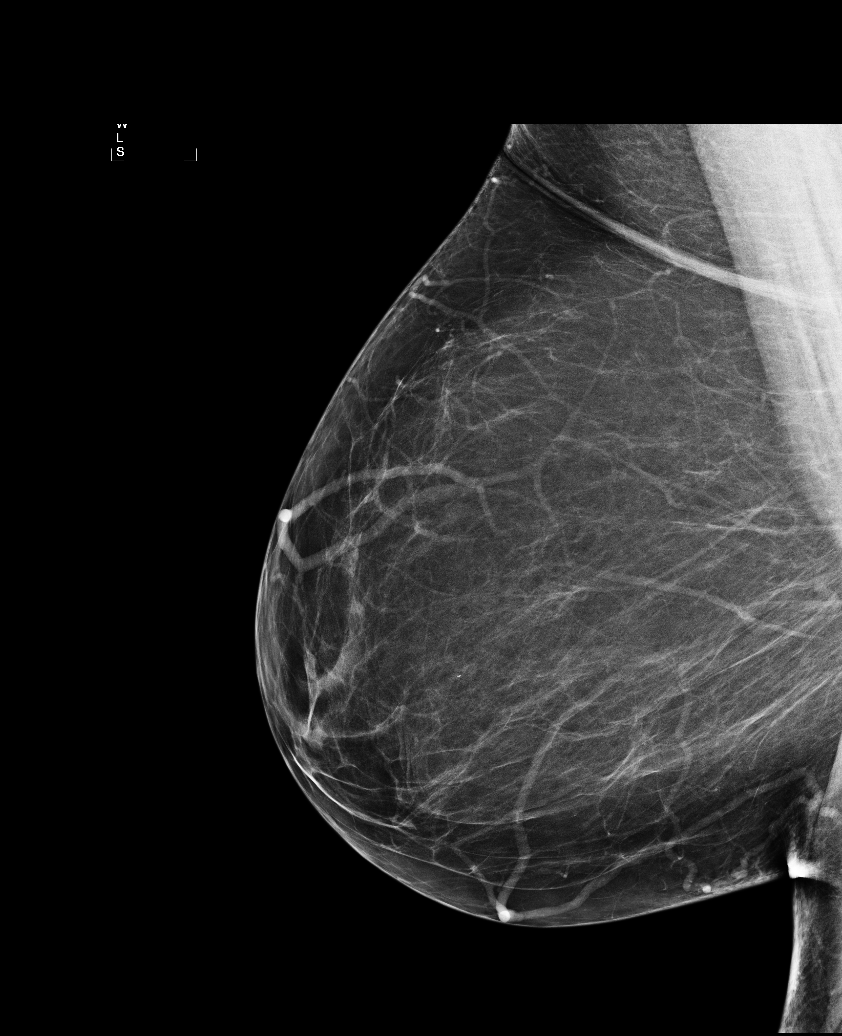

[L MLO (2 of 2)]
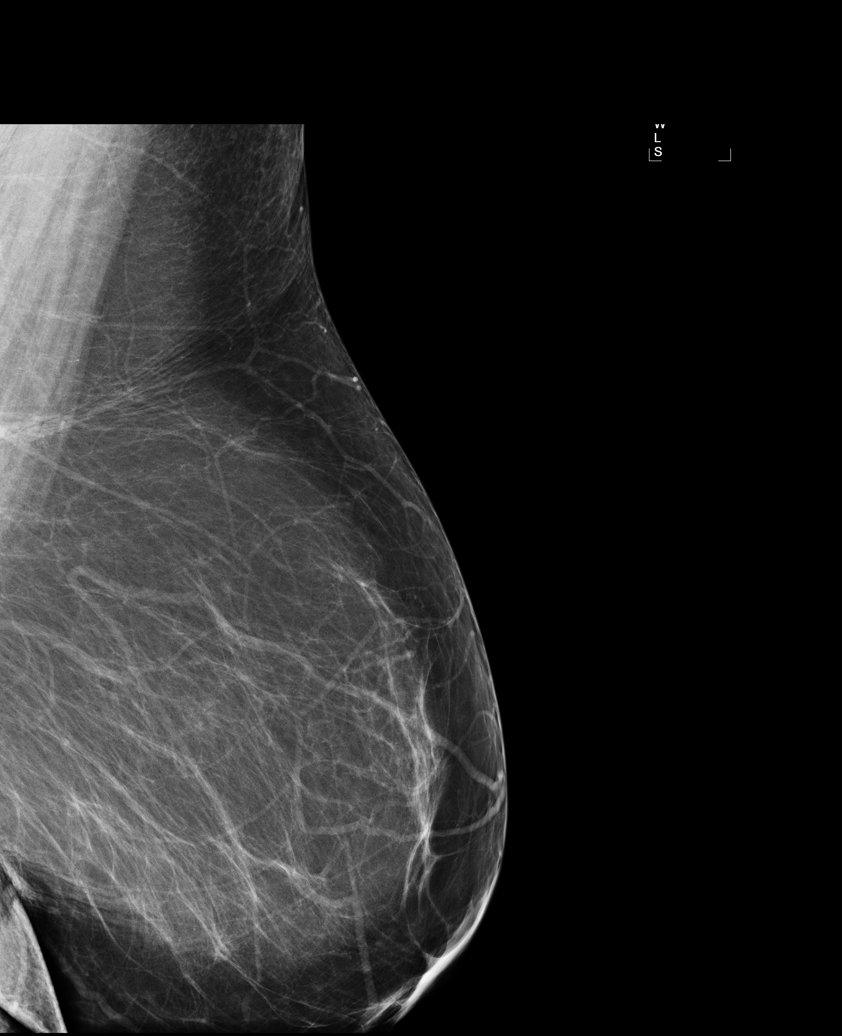

[R MLO (2 of 2)]
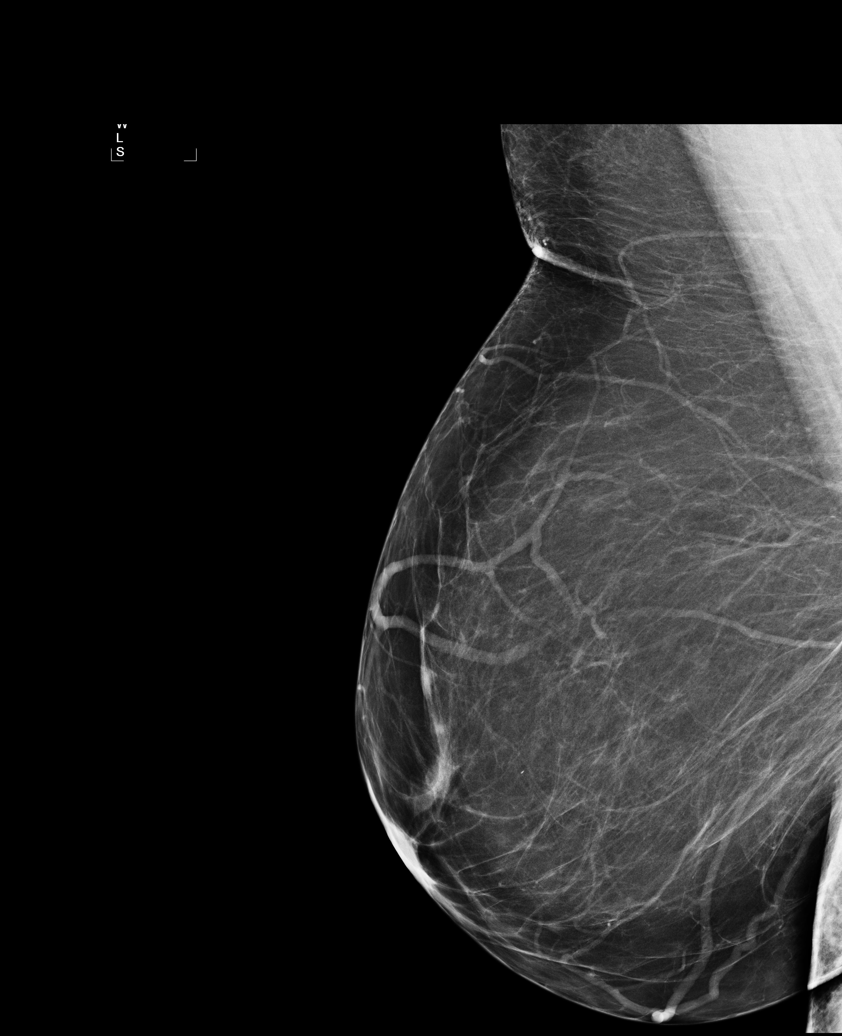

[6 of 6 positions shown; findings below may reference images not displayed]

ACR Breast Density Category b: There are scattered areas of
fibroglandular density.
FINDINGS: There are no findings suspicious for malignancy. Images were
processed with CAD.
IMPRESSION: No mammographic evidence of malignancy. A result letter of this
screening mammogram will be mailed directly to the patient.

RECOMMENDATION:
Screening mammogram in one year. (Code:[US])

BI-RADS CATEGORY  1: Negative.

## 2015-08-12 LAB — HM MAMMOGRAPHY

## 2015-08-12 NOTE — Addendum Note (Signed)
Addended by: Etta Grandchild on: 08/12/2015 09:08 AM   Modules accepted: Kipp Brood

## 2015-08-13 ENCOUNTER — Telehealth: Payer: Self-pay | Admitting: Gastroenterology

## 2015-08-13 NOTE — Telephone Encounter (Signed)
Records received from [facility]. Colonoscopy 07/11/2014 showed that the distal TI was entirely normal. She had pan diverticulosis and a diminutive polyp was resected from the mid right colon. Random colonic biopsies were obtained to rule out microscopic colitis. She did have grade 2 internal hemorrhoids. This was performed by Dr. Conley Rolls.  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 Random biopsies from the left colon showed no abnormalities. Random right sided colon biopsies also showed no abnormalities. CT abdomen and pelvis with contrast was performed at that time as well. This showed findings concerning for middle lobe pneumonia, but otherwise there were no significant findings.  These records are being sent to be scanned.

## 2015-09-02 ENCOUNTER — Other Ambulatory Visit: Payer: Self-pay | Admitting: Gastroenterology

## 2015-09-09 ENCOUNTER — Telehealth: Payer: Self-pay | Admitting: Gastroenterology

## 2015-09-09 NOTE — Telephone Encounter (Signed)
Patient is calling with update. She is taking Questran 4 gram packet daily and Imodium. She reports she is still having 3+ diarrhea stools/day. She is not having accidents in the bed at this time. Please, advise.

## 2015-09-10 MED ORDER — CHOLESTYRAMINE 4 G PO PACK: 4.0000 g | PACK | Freq: Two times a day (BID) | ORAL | Status: DC

## 2015-09-10 NOTE — Telephone Encounter (Signed)
Increase questran to twice daily.  Needs to take this 1.5 hours away from her other medications.  Needs office visit within the next month with Dr. Christella HartiganJacobs or APP to reassess/get update instead of continuing to treat over the phone since she has not been seen since November.  Thank you,  Jess

## 2015-09-10 NOTE — Telephone Encounter (Signed)
Patient given recommendations. Rx sent to pharmacy. Scheduled OV with Mike GipAmy Esterwood, PA on 09/22/15 at 1:30 PM.

## 2015-09-16 ENCOUNTER — Encounter: Payer: Self-pay | Admitting: Internal Medicine

## 2015-09-16 ENCOUNTER — Ambulatory Visit (INDEPENDENT_AMBULATORY_CARE_PROVIDER_SITE_OTHER): Payer: Medicare Other | Admitting: Internal Medicine

## 2015-09-16 ENCOUNTER — Other Ambulatory Visit (INDEPENDENT_AMBULATORY_CARE_PROVIDER_SITE_OTHER): Payer: Medicare Other

## 2015-09-16 VITALS — BP 110/80 | HR 78 | Temp 98.1°F | Resp 16 | Ht 62.0 in | Wt 247.0 lb

## 2015-09-16 DIAGNOSIS — I10 Essential (primary) hypertension: Secondary | ICD-10-CM | POA: Diagnosis not present

## 2015-09-16 DIAGNOSIS — K529 Noninfective gastroenteritis and colitis, unspecified: Secondary | ICD-10-CM

## 2015-09-16 DIAGNOSIS — M170 Bilateral primary osteoarthritis of knee: Secondary | ICD-10-CM

## 2015-09-16 DIAGNOSIS — D509 Iron deficiency anemia, unspecified: Secondary | ICD-10-CM

## 2015-09-16 DIAGNOSIS — M5416 Radiculopathy, lumbar region: Secondary | ICD-10-CM | POA: Diagnosis not present

## 2015-09-16 DIAGNOSIS — Z1159 Encounter for screening for other viral diseases: Secondary | ICD-10-CM | POA: Diagnosis not present

## 2015-09-16 DIAGNOSIS — E038 Other specified hypothyroidism: Secondary | ICD-10-CM

## 2015-09-16 LAB — BASIC METABOLIC PANEL
BUN: 23 mg/dL (ref 6–23)
CHLORIDE: 104 meq/L (ref 96–112)
CO2: 25 mEq/L (ref 19–32)
CREATININE: 0.91 mg/dL (ref 0.40–1.20)
Calcium: 9.6 mg/dL (ref 8.4–10.5)
GFR: 81.7 mL/min (ref >60.00)
GLUCOSE: 109 mg/dL — AB (ref 70–99)
POTASSIUM: 4.1 meq/L (ref 3.5–5.1)
Sodium: 138 mEq/L (ref 135–145)

## 2015-09-16 LAB — CBC WITH DIFFERENTIAL/PLATELET
BASOS ABS: 0.1 10*3/uL (ref 0.0–0.1)
BASOS PCT: 1.4 % (ref 0.0–3.0)
EOS ABS: 0 10*3/uL (ref 0.0–0.7)
Eosinophils Relative: 0.5 % (ref 0.0–5.0)
HEMATOCRIT: 37.6 % (ref 36.0–46.0)
HEMOGLOBIN: 12.7 g/dL (ref 12.0–15.0)
Lymphocytes Relative: 32.9 % (ref 12.0–46.0)
Lymphs Abs: 2.4 10*3/uL (ref 0.7–4.0)
MCHC: 33.8 g/dL (ref 30.0–36.0)
MCV: 90.6 fl (ref 78.0–100.0)
MONOS PCT: 8 % (ref 3.0–12.0)
Monocytes Absolute: 0.6 10*3/uL (ref 0.1–1.0)
NEUTROS ABS: 4.2 10*3/uL (ref 1.4–7.7)
Neutrophils Relative %: 57.2 % (ref 43.0–77.0)
Platelets: 316 10*3/uL (ref 150.0–400.0)
RBC: 4.15 Mil/uL (ref 3.87–5.11)
RDW: 14.8 % (ref 11.5–15.5)
WBC: 7.3 10*3/uL (ref 4.0–10.5)

## 2015-09-16 LAB — TSH: TSH: 1.73 u[IU]/mL (ref 0.35–4.50)

## 2015-09-16 MED ORDER — OXYCODONE-ACETAMINOPHEN 7.5-325 MG PO TABS: 1.0000 | ORAL_TABLET | ORAL | Status: DC | PRN

## 2015-09-16 NOTE — Patient Instructions (Signed)
Hypertension Hypertension, commonly called high blood pressure, is when the force of blood pumping through your arteries is too strong. Your arteries are the blood vessels that carry blood from your heart throughout your body. A blood pressure reading consists of a higher number over a lower number, such as 110/72. The higher number (systolic) is the pressure inside your arteries when your heart pumps. The lower number (diastolic) is the pressure inside your arteries when your heart relaxes. Ideally you want your blood pressure below 120/80. Hypertension forces your heart to work harder to pump blood. Your arteries may become narrow or stiff. Having untreated or uncontrolled hypertension can cause heart attack, stroke, kidney disease, and other problems. RISK FACTORS Some risk factors for high blood pressure are controllable. Others are not.  Risk factors you cannot control include:   Race. You may be at higher risk if you are African American.  Age. Risk increases with age.  Gender. Men are at higher risk than women before age 45 years. After age 65, women are at higher risk than men. Risk factors you can control include:  Not getting enough exercise or physical activity.  Being overweight.  Getting too much fat, sugar, calories, or salt in your diet.  Drinking too much alcohol. SIGNS AND SYMPTOMS Hypertension does not usually cause signs or symptoms. Extremely high blood pressure (hypertensive crisis) may cause headache, anxiety, shortness of breath, and nosebleed. DIAGNOSIS To check if you have hypertension, your health care provider will measure your blood pressure while you are seated, with your arm held at the level of your heart. It should be measured at least twice using the same arm. Certain conditions can cause a difference in blood pressure between your right and left arms. A blood pressure reading that is higher than normal on one occasion does not mean that you need treatment. If  it is not clear whether you have high blood pressure, you may be asked to return on a different day to have your blood pressure checked again. Or, you may be asked to monitor your blood pressure at home for 1 or more weeks. TREATMENT Treating high blood pressure includes making lifestyle changes and possibly taking medicine. Living a healthy lifestyle can help lower high blood pressure. You may need to change some of your habits. Lifestyle changes may include:  Following the DASH diet. This diet is high in fruits, vegetables, and whole grains. It is low in salt, red meat, and added sugars.  Keep your sodium intake below 2,300 mg per day.  Getting at least 30-45 minutes of aerobic exercise at least 4 times per week.  Losing weight if necessary.  Not smoking.  Limiting alcoholic beverages.  Learning ways to reduce stress. Your health care provider may prescribe medicine if lifestyle changes are not enough to get your blood pressure under control, and if one of the following is true:  You are 18-59 years of age and your systolic blood pressure is above 140.  You are 60 years of age or older, and your systolic blood pressure is above 150.  Your diastolic blood pressure is above 90.  You have diabetes, and your systolic blood pressure is over 140 or your diastolic blood pressure is over 90.  You have kidney disease and your blood pressure is above 140/90.  You have heart disease and your blood pressure is above 140/90. Your personal target blood pressure may vary depending on your medical conditions, your age, and other factors. HOME CARE INSTRUCTIONS    Have your blood pressure rechecked as directed by your health care provider.   Take medicines only as directed by your health care provider. Follow the directions carefully. Blood pressure medicines must be taken as prescribed. The medicine does not work as well when you skip doses. Skipping doses also puts you at risk for  problems.  Do not smoke.   Monitor your blood pressure at home as directed by your health care provider. SEEK MEDICAL CARE IF:   You think you are having a reaction to medicines taken.  You have recurrent headaches or feel dizzy.  You have swelling in your ankles.  You have trouble with your vision. SEEK IMMEDIATE MEDICAL CARE IF:  You develop a severe headache or confusion.  You have unusual weakness, numbness, or feel faint.  You have severe chest or abdominal pain.  You vomit repeatedly.  You have trouble breathing. MAKE SURE YOU:   Understand these instructions.  Will watch your condition.  Will get help right away if you are not doing well or get worse.   This information is not intended to replace advice given to you by your health care provider. Make sure you discuss any questions you have with your health care provider.   Document Released: 06/26/2005 Document Revised: 11/10/2014 Document Reviewed: 04/18/2013 Elsevier Interactive Patient Education 2016 [employer]  

## 2015-09-16 NOTE — Progress Notes (Signed)
Subjective:  Patient ID: Danielle Holt, female    DOB: 07/15/1955  Age: 60 y.o. MRN: [id number]  CC: Osteoarthritis; Back Pain; Hypertension; Hypothyroidism; and Anemia   HPI Danielle Holt presents for a follow-up on thyroid disease, arthritis pain in her knees, and hypertension. She is struggling with chronic, persistent diarrhea and has seen a GI doctor at another location. She is trying Questran with some symptom relief.  She also wants to be tested for hepatitis C and HIV today.  She tells me that her blood pressures been well controlled with her current regimen, she denies chest pain, shortness of breath, edema, fatigue.  Outpatient Prescriptions Prior to Visit  Medication Sig Dispense Refill  . albuterol (PROAIR HFA) 108 (90 BASE) MCG/ACT inhaler Inhale 2 puffs into the lungs every 6 (six) hours as needed for wheezing. 1 Inhaler 11  . amLODipine (NORVASC) 2.5 MG tablet TAKE 1 TABLET BY MOUTH EVERY DAY 90 tablet 3  . atorvastatin (LIPITOR) 20 MG tablet TAKE 1 TABLET BY MOUTH EVERY NIGHT AT BEDTIME 90 tablet 3  . buPROPion (WELLBUTRIN XL) 150 MG 24 hr tablet TK 2 TS PO QD  1  . busPIRone (BUSPAR) 10 MG tablet Take 1/2 tab 3 times a day for a week, then 1 tab 3 times a day    . cholestyramine (QUESTRAN) 4 g packet Take 1 packet (4 g total) by mouth 2 (two) times daily. (Patient taking differently: Take 8 g by mouth 2 (two) times daily. ) 60 each 3  . ferrous sulfate 325 (65 FE) MG tablet Take 1 tablet (325 mg total) by mouth daily with breakfast. 90 tablet 3  . Fluticasone-Salmeterol (ADVAIR DISKUS) 250-50 MCG/DOSE AEPB Inhale 1 puff into the lungs 2 (two) times daily. 60 each 11  . Levothyroxine Sodium 13 MCG CAPS Take 1 capsule (13 mcg total) by mouth daily. 30 capsule 5  . losartan (COZAAR) 50 MG tablet Take 1 tablet (50 mg total) by mouth daily. 90 tablet 3  . metoprolol succinate (TOPROL-XL) 50 MG 24 hr tablet Take 1 tablet (50 mg total) by mouth daily. Take with or  immediately following a meal. 90 tablet 2  . naproxen (NAPROSYN) 500 MG tablet Take as directed with sumatriptan 10 tablet 2  . nortriptyline (PAMELOR) 25 MG capsule TAKE 1 CAPSULE(25 MG) BY MOUTH AT BEDTIME 90 capsule 0  . pantoprazole (PROTONIX) 40 MG tablet TAKE 1 TABLET(40 MG) BY MOUTH DAILY 90 tablet 0  . SUMAtriptan (IMITREX) 100 MG tablet Take 1 tablet (100 mg total) by mouth every 2 (two) hours as needed for migraine. May repeat in 2 hours if headache persists or recurs. 10 tablet 3  . tiZANidine (ZANAFLEX) 4 MG tablet Take 1 tablet (4 mg total) by mouth 2 (two) times daily as needed. 60 tablet 11  . triamterene-hydrochlorothiazide (MAXZIDE-25) 37.5-25 MG per tablet TK 1 T PO QD  2  . ziprasidone (GEODON) 40 MG capsule TAKE 1 CAPSULE BY MOUTH AT BEDTIME 90 capsule 3  . clonazePAM (KLONOPIN) 1 MG tablet     . dexlansoprazole (DEXILANT) 60 MG capsule Take 60 mg by mouth daily.    Marland Kitchen. oxyCODONE-acetaminophen (PERCOCET) 7.5-325 MG tablet Take 1 tablet by mouth every 4 (four) hours as needed. 90 tablet 0  . potassium chloride SA (K-DUR,KLOR-CON) 20 MEQ tablet Reported on 09/16/2015  1   No facility-administered medications prior to visit.    ROS Review of Systems  Constitutional: Negative.  Negative for fever, activity change,  appetite change, fatigue and unexpected weight change.  HENT: Negative.  Negative for congestion, sore throat and trouble swallowing.   Eyes: Negative.   Respiratory: Negative.  Negative for cough, choking, chest tightness, shortness of breath and stridor.   Cardiovascular: Negative.  Negative for chest pain, palpitations and leg swelling.  Gastrointestinal: Positive for diarrhea. Negative for nausea, vomiting, abdominal pain, constipation and blood in stool.  Endocrine: Negative.   Genitourinary: Negative.  Negative for difficulty urinating.  Musculoskeletal: Positive for arthralgias. Negative for myalgias, back pain and joint swelling.  Skin: Negative.  Negative for  color change, pallor, rash and wound.  Allergic/Immunologic: Negative.   Neurological: Negative.  Negative for dizziness, light-headedness and headaches.  Hematological: Negative.   Psychiatric/Behavioral: Negative.  Negative for dysphoric mood, decreased concentration and agitation. The patient is not nervous/anxious.     Objective:  BP 110/80 mmHg  Pulse 78  Temp(Src) 98.1 F (36.7 C) (Oral)  Resp 16  Ht  (1.575 m)  Wt 247 lb (112.038 kg)  BMI 45.17 kg/m2  SpO2 99%  BP Readings from Last 3 Encounters:  09/16/15 110/80  07/29/15 120/82  07/27/15 126/80    Wt Readings from Last 3 Encounters:  09/16/15 247 lb (112.038 kg)  07/29/15 237 lb (107.502 kg)  07/27/15 235 lb (106.595 kg)    Physical Exam  Constitutional: She is oriented to person, place, and time. No distress.  HENT:  Head: Normocephalic and atraumatic.  Mouth/Throat: Oropharynx is clear and moist. No oropharyngeal exudate.  Eyes: Conjunctivae are normal. Right eye exhibits no discharge. Left eye exhibits no discharge. No scleral icterus.  Neck: Normal range of motion. Neck supple. No JVD present. No tracheal deviation present. No thyromegaly present.  Cardiovascular: Normal rate, regular rhythm, normal heart sounds and intact distal pulses.  Exam reveals no gallop and no friction rub.   No murmur heard. Pulmonary/Chest: Effort normal and breath sounds normal. No stridor. No respiratory distress. She has no wheezes. She has no rales. She exhibits no tenderness.  Abdominal: Soft. Bowel sounds are normal. She exhibits no distension and no mass. There is no tenderness. There is no rebound and no guarding.  Musculoskeletal: Normal range of motion. She exhibits no edema or tenderness.  Lymphadenopathy:    She has no cervical adenopathy.  Neurological: She is oriented to person, place, and time.  Skin: Skin is warm and dry. No rash noted. She is not diaphoretic. No erythema. No pallor.  Psychiatric: She has a  normal mood and affect. Her behavior is normal. Judgment and thought content normal.  Vitals reviewed.   Lab Results  Component Value Date   WBC 7.3 09/16/2015   HGB 12.7 09/16/2015   HCT 37.6 09/16/2015   PLT 316.0 09/16/2015   GLUCOSE 109* 09/16/2015   CHOL 178 05/06/2015   TRIG 165.0* 05/06/2015   HDL 62.70 05/06/2015   LDLDIRECT 105.9 10/29/2012   LDLCALC 82 05/06/2015   ALT 21 05/06/2015   AST 21 05/06/2015   NA 138 09/16/2015   K 4.1 09/16/2015   CL 104 09/16/2015   CREATININE 0.91 09/16/2015   BUN 23 09/16/2015   CO2 25 09/16/2015   TSH 1.73 09/16/2015   HGBA1C 5.6 05/06/2015    Mm Digital Screening Bilateral  08/12/2015  CLINICAL DATA:  Screening. EXAM: DIGITAL SCREENING BILATERAL MAMMOGRAM WITH CAD COMPARISON:  Previous exam(s). ACR Breast Density Category b: There are scattered areas of fibroglandular density. FINDINGS: There are no findings suspicious for malignancy. Images were processed with  CAD. IMPRESSION: No mammographic evidence of malignancy. A result letter of this screening mammogram will be mailed directly to the patient. RECOMMENDATION: Screening mammogram in one year. (Code:SM-B-01Y) BI-RADS CATEGORY  1: Negative. Electronically Signed   By: Gerome Sam III M.D   On: 08/12/2015 07:17    Assessment & Plan:    I have discontinued Ms. Eilts's dexlansoprazole and clonazePAM. I am also having her maintain her Fluticasone-Salmeterol, albuterol, tiZANidine, Levothyroxine Sodium, ferrous sulfate, losartan, ziprasidone, triamterene-hydrochlorothiazide, potassium chloride SA, buPROPion, busPIRone, SUMAtriptan, amLODipine, atorvastatin, metoprolol succinate, naproxen, nortriptyline, pantoprazole, cholestyramine, and oxyCODONE-acetaminophen.  Meds ordered this encounter  Medications  . DISCONTD: oxyCODONE-acetaminophen (PERCOCET) 7.5-325 MG tablet    Sig: Take 1 tablet by mouth every 4 (four) hours as needed.    Dispense:  90 tablet    Refill:  0    Fill on  or after 10/18/15  . oxyCODONE-acetaminophen (PERCOCET) 7.5-325 MG tablet    Sig: Take 1 tablet by mouth every 4 (four) hours as needed.    Dispense:  90 tablet    Refill:  0    Fill on or after 11/17/15     Follow-up: Return in about 4 months (around 01/16/2016).  Sanda Linger, MD

## 2015-09-17 LAB — HEPATITIS C ANTIBODY: HCV Ab: NEGATIVE

## 2015-09-17 LAB — HIV ANTIBODY (ROUTINE TESTING W REFLEX): HIV 1&2 Ab, 4th Generation: NONREACTIVE

## 2015-09-18 ENCOUNTER — Encounter: Payer: Self-pay | Admitting: Internal Medicine

## 2015-09-18 NOTE — Assessment & Plan Note (Signed)
Her blood pressures well controlled. Electrolytes and renal function are stable.

## 2015-09-18 NOTE — Assessment & Plan Note (Signed)
She will continue Percocet as needed for the pain.

## 2015-09-18 NOTE — Assessment & Plan Note (Signed)
Her TSH is in the normal range. She will remain on the current dose of Synthroid.

## 2015-09-18 NOTE — Assessment & Plan Note (Signed)
Improvement noted 

## 2015-09-21 ENCOUNTER — Encounter: Payer: Self-pay | Admitting: *Deleted

## 2015-09-22 ENCOUNTER — Telehealth: Payer: Self-pay

## 2015-09-22 ENCOUNTER — Ambulatory Visit (INDEPENDENT_AMBULATORY_CARE_PROVIDER_SITE_OTHER): Payer: Medicare Other | Admitting: Physician Assistant

## 2015-09-22 ENCOUNTER — Encounter: Payer: Self-pay | Admitting: Physician Assistant

## 2015-09-22 VITALS — BP 188/74 | HR 80 | Ht 63.0 in | Wt 249.1 lb

## 2015-09-22 DIAGNOSIS — K529 Noninfective gastroenteritis and colitis, unspecified: Secondary | ICD-10-CM | POA: Diagnosis not present

## 2015-09-22 DIAGNOSIS — K589 Irritable bowel syndrome without diarrhea: Secondary | ICD-10-CM

## 2015-09-22 DIAGNOSIS — K219 Gastro-esophageal reflux disease without esophagitis: Secondary | ICD-10-CM | POA: Diagnosis not present

## 2015-09-22 MED ORDER — ELUXADOLINE 75 MG PO TABS: 75.0000 mg | ORAL_TABLET | Freq: Two times a day (BID) | ORAL | Status: DC

## 2015-09-22 MED ORDER — FAMOTIDINE 40 MG PO TABS: 40.0000 mg | ORAL_TABLET | Freq: Every day | ORAL | Status: DC

## 2015-09-22 NOTE — Progress Notes (Signed)
Patient ID: Danielle Holt, female   DOB: [birth year], 60 y.o.   MRN: [id number]   Subjective:    Patient ID: Danielle Holt, female    DOB: 04-18-1956, 60 y.o.   MRN: [id number]  HPI  Danielle Holt is a pleasant 60 year old African-American female known to Dr. Christella Holt who has history of chronic diarrhea, felt either IBS D versus bile salt induced. She is status post cholecystectomy in 1999. Danielle Holt She has history of hypertension obesity migraine headaches, pan diverticulosis, COPD, and GERD. Line Colonoscopy was done in January 2016 per Dr. Conley Holt, in [facility] we do have copies of that report which showed pandiverticulosis no evidence of colitis she had a diminutive right colon polyp removed. Also noted grade 2 internal hemorrhoids. Biopsies did not show any evidence of microscopic colitis the polyp was a tubular adenoma. She was seen in the office in November 2016 with complaints of the chronic diarrhea. She had been on Dexilant and this was stopped and she also had Questran added to her regimen initially once daily and then increase to twice daily. She says she's having more problems with her reflux on proton X and is having frequent daily symptoms and frequent nocturnal waking with reflux and sour brash. The diarrhea is somewhat improved though some days better than others and continues to have diarrhea most days. She is only requiring Imodium periodically.  Review of Systems Pertinent positive and negative review of systems were noted in the above HPI section.  All other review of systems was otherwise negative.  Outpatient Encounter Prescriptions as of 09/22/2015  Medication Sig  . albuterol (PROAIR HFA) 108 (90 BASE) MCG/ACT inhaler Inhale 2 puffs into the lungs every 6 (six) hours as needed for wheezing.  Marland Kitchen amLODipine (NORVASC) 2.5 MG tablet TAKE 1 TABLET BY MOUTH EVERY DAY  . atorvastatin (LIPITOR) 20 MG tablet TAKE 1 TABLET BY MOUTH EVERY NIGHT AT BEDTIME  . buPROPion (WELLBUTRIN  XL) 150 MG 24 hr tablet TK 2 TS PO QD  . busPIRone (BUSPAR) 10 MG tablet Take 1/2 tab 3 times a day for a week, then 1 tab 3 times a day  . cholestyramine (QUESTRAN) 4 g packet Take 1 packet (4 g total) by mouth 2 (two) times daily. (Patient taking differently: Take 8 g by mouth 2 (two) times daily. )  . ferrous sulfate 325 (65 FE) MG tablet Take 1 tablet (325 mg total) by mouth daily with breakfast.  . Fluticasone-Salmeterol (ADVAIR DISKUS) 250-50 MCG/DOSE AEPB Inhale 1 puff into the lungs 2 (two) times daily.  . Levothyroxine Sodium 13 MCG CAPS Take 1 capsule (13 mcg total) by mouth daily.  Marland Kitchen losartan (COZAAR) 50 MG tablet Take 1 tablet (50 mg total) by mouth daily.  . metoprolol succinate (TOPROL-XL) 50 MG 24 hr tablet Take 1 tablet (50 mg total) by mouth daily. Take with or immediately following a meal.  . naproxen (NAPROSYN) 500 MG tablet Take as directed with sumatriptan  . nortriptyline (PAMELOR) 25 MG capsule TAKE 1 CAPSULE(25 MG) BY MOUTH AT BEDTIME  . oxyCODONE-acetaminophen (PERCOCET) 7.5-325 MG tablet Take 1 tablet by mouth every 4 (four) hours as needed.  . pantoprazole (PROTONIX) 40 MG tablet TAKE 1 TABLET(40 MG) BY MOUTH DAILY  . potassium chloride SA (K-DUR,KLOR-CON) 20 MEQ tablet Reported on 09/16/2015  . SUMAtriptan (IMITREX) 100 MG tablet Take 1 tablet (100 mg total) by mouth every 2 (two) hours as needed for migraine. May repeat in 2 hours if headache persists  or recurs.  Marland Kitchen tiZANidine (ZANAFLEX) 4 MG tablet Take 1 tablet (4 mg total) by mouth 2 (two) times daily as needed.  . triamterene-hydrochlorothiazide (MAXZIDE-25) 37.5-25 MG per tablet TK 1 T PO QD  . ziprasidone (GEODON) 40 MG capsule TAKE 1 CAPSULE BY MOUTH AT BEDTIME  . Eluxadoline (VIBERZI) 75 MG TABS Take 75 mg by mouth 2 (two) times daily.  . famotidine (PEPCID) 40 MG tablet Take 1 tablet (40 mg total) by mouth at bedtime.  . [DISCONTINUED] Eluxadoline (VIBERZI) 75 MG TABS Take 75 mg by mouth 2 (two) times daily.    No facility-administered encounter medications on file as of 09/22/2015.   Allergies  Allergen Reactions  . Fentanyl Other (See Comments)    Hallucinations per pt  . Zolpidem     Other reaction(s): Other (See Comments) Hallucinations.  . Ativan [Lorazepam]     Memory loss  . Hyoscyamine Swelling  . Varenicline     REACTION: amnesia  . Varenicline Tartrate     REACTION: amnesia  . Zolpidem Tartrate     REACTION: Short term memory loss  . Viibryd [Vilazodone Hcl]     Visual disturbance   Patient Active Problem List   Diagnosis Date Noted  . Chronic migraine without aura without status migrainosus, not intractable 07/27/2015  . Morbid obesity (HCC) 07/27/2015  . Visit for screening mammogram 06/22/2015  . Chronic diarrhea 05/06/2015  . Hyperglycemia 05/06/2015  . Anemia, iron deficiency 05/06/2015  . Left lumbar radiculitis 01/02/2013  . Migraine headache 01/02/2013  . Other screening mammogram 10/29/2012  . Essential hypertension, benign 02/23/2012  . GERD (gastroesophageal reflux disease) 08/24/2011  . 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 Obesity, Class III, BMI 40-49.9 (morbid obesity) (HCC) 08/24/2011  . Hypothyroidism 07/19/2011  . INSOMNIA 09/20/2009  . Asthma, mild persistent 03/03/2009  . Allergic rhinitis 08/17/2008  . Vitamin B12 deficiency anemia 06/25/2008  . DJD (degenerative joint disease) of knee 06/23/2008  . Hyperlipidemia with target LDL less than 100 06/01/2008  . TOBACCO USE 06/01/2008  . Depressive disorder, not elsewhere classified 06/01/2008  . COPD 06/01/2008   Social History   Social History  . Marital Status: Single    Spouse Name: N/A  . Number of Children: N/A  . Years of Education: N/A   Occupational History  . Not on file.   Social History Main Topics  . Smoking status: Current Some Day Smoker -- 0.50 packs/day for 40 years    Types: Cigarettes    Last Attempt to Quit: 07/16/2011  . Smokeless tobacco: Never Used   . Alcohol Use: No  . Drug Use: No  . Sexual Activity: Yes   Other Topics Concern  . Not on file   Social History Narrative    Ms. Danielle Holt's family history is not on file. She was adopted.      Objective:    Filed Vitals:   09/22/15 1325  BP: 188/74  Pulse: 80    Physical Exam well-developed older African-American female in no acute distress, pleasant blood pressure 188/74 pulse 80 height 5 foot 3 weight 249. HEENT nontraumatic, cephalic EOMI PERRLA sclera anicteric- not further examined today discussion only   Assessment & Plan:   #1 60 yo female With chronic diarrhea-IBS D versus bile salt induced-she has not had a great response to Questran 4 g twice a day and is on multiple other medications and therefore concerned with Questran interfering with absorption of other meds  #2 chronic GERD-poorly controlled on Protonix 40  mg every morning #3 history of adenomatous colon polyps would be due for follow-up colonoscopy January 2021 #4 status post remote cholecystectomy #5 obesity #6 COPD  Plan; continue Protonix 40 mg by mouth every morning Add Pepcid 40 mg by mouth daily at bedtime Also discussed tightening her antireflux regimen including nothing by mouth for at least 3 hours prior to bedtime and elevating the head of the bed at least 45 Will give her a trial of Viberzi 75 mg by mouth twice a day-if this is more effective than Questran she can continue  Viberzi  chronically, if she does not have good results with Viberzi  we'll go back to Questran 4 g by mouth twice a day She is asked to call in 3-4 weeks with a progress report.    Rosita Guzzetta Oswald HillockS Datrell Dunton PA-C 09/22/2015   Cc: Etta GrandchildJones, Thomas L, MD

## 2015-09-22 NOTE — Patient Instructions (Signed)
We sent prescriptions to [facility], [address], [place], [place] 1. Viberzi 75 mg 2. Pepcid 40 mg, take 1 tab at bedtime.  Continue the Protonix 40 mg, 1 tab by mouth every morning.  Call in 3-4 weeks with an update. Ask to speak to Gareth EagleBeth, Amy Esterwood 's nurse.

## 2015-09-22 NOTE — Telephone Encounter (Signed)
Submitted prior authorization to [employer] for [employer]

## 2015-09-23 NOTE — Progress Notes (Signed)
I agree with the above note, plan 

## 2015-09-24 ENCOUNTER — Telehealth: Payer: Self-pay | Admitting: *Deleted

## 2015-09-24 NOTE — Telephone Encounter (Signed)
Got approval for patient's Viberzi on [employer].  Called pharmacy to have them run it again - it went through at $3.70 for a 90 day supply,  Pam will call the patient to let her know.

## 2015-09-24 NOTE — Telephone Encounter (Signed)
Advised the patient that my co-worker Alonna BucklerLeslie Wrenn, CMA, was able to do the prior authorization for this Viberzi 75 mg on [employer] website.  She was able to get it for the patient for 3 months for $3.70.  The patient was happy and thanked us for this.  [facility] and [facility] are getting the medication ready for her.

## 2015-10-04 ENCOUNTER — Encounter: Payer: Self-pay | Admitting: Internal Medicine

## 2015-10-04 ENCOUNTER — Other Ambulatory Visit: Payer: Self-pay | Admitting: Internal Medicine

## 2015-10-13 ENCOUNTER — Telehealth: Payer: Self-pay | Admitting: Physician Assistant

## 2015-10-14 NOTE — Telephone Encounter (Signed)
Patient is instructed. She agrees to try this plan of care.

## 2015-10-14 NOTE — Telephone Encounter (Signed)
She has been taking Viberzi twice a day. She has diarrhea anyway. She is taking OTC Imodium. States it works sometimes. How much of the Questran do you want her to take. She was previously on 2 packs a day.

## 2015-10-14 NOTE — Telephone Encounter (Signed)
Have her stop the Viberzi  If not working, restart Questran 4 gm po BID  At least 2 hours away from any other meds, and can continue Imodium on every am then up to 4 per day

## 2015-10-18 ENCOUNTER — Telehealth: Payer: Self-pay | Admitting: Internal Medicine

## 2015-10-18 NOTE — Telephone Encounter (Signed)
fyi

## 2015-10-18 NOTE — Telephone Encounter (Signed)
This makes sense since her last urine drug screen was negative for oxycodone. I have canceled the oxycodone prescription in our EMR Please discontinue filling the medication at this time.

## 2015-10-18 NOTE — Telephone Encounter (Signed)
Pt informed and stated understanding

## 2015-10-18 NOTE — Telephone Encounter (Signed)
Would you like for pt to be contacted and informed that oxy will no longer be rxed due to neg UDS result?

## 2015-10-18 NOTE — Telephone Encounter (Signed)
[facility] called to advise that they received an anonymous phone call today stating that the patient is selling her oxycodone. The patient filled it today, and the patient allegedly sells right after she leaves the pharmacy   She advised to let them know at the pharmacy if we want to d/c filling the medication. They also made a note in their system for future reference./

## 2015-10-18 NOTE — Telephone Encounter (Signed)
Fyi.

## 2015-10-18 NOTE — Telephone Encounter (Signed)
yes

## 2015-10-19 ENCOUNTER — Ambulatory Visit: Payer: Medicare Other | Admitting: Internal Medicine

## 2015-11-02 ENCOUNTER — Other Ambulatory Visit: Payer: Self-pay | Admitting: Internal Medicine

## 2015-11-11 ENCOUNTER — Ambulatory Visit: Payer: Medicare Other | Admitting: Internal Medicine

## 2015-11-16 ENCOUNTER — Ambulatory Visit: Payer: Medicare Other | Admitting: Neurology

## 2015-11-17 ENCOUNTER — Ambulatory Visit: Payer: Medicare Other | Admitting: Internal Medicine

## 2015-11-20 ENCOUNTER — Other Ambulatory Visit: Payer: Self-pay | Admitting: Gastroenterology

## 2015-11-24 ENCOUNTER — Ambulatory Visit: Payer: Medicare Other | Admitting: Internal Medicine

## 2015-12-07 ENCOUNTER — Ambulatory Visit: Payer: Medicare Other | Admitting: Internal Medicine

## 2015-12-10 ENCOUNTER — Other Ambulatory Visit: Payer: Self-pay | Admitting: Internal Medicine

## 2016-02-28 ENCOUNTER — Telehealth: Payer: Self-pay | Admitting: Internal Medicine

## 2016-02-28 NOTE — Telephone Encounter (Signed)
Patient states she was discharged as a patient of Dr. Yetta BarreJones.  I do not see anything in her chart.  Patient wants to schedule appointment for September with Dr. Yetta BarreJones.  Please advise.

## 2016-02-28 NOTE — Telephone Encounter (Signed)
I do not prescribe narcotics for her anymore but I did not discharge her

## 2016-02-29 NOTE — Telephone Encounter (Signed)
Notified patient.

## 2016-03-08 ENCOUNTER — Encounter: Payer: Self-pay | Admitting: Internal Medicine

## 2016-03-08 ENCOUNTER — Telehealth: Payer: Self-pay

## 2016-03-08 ENCOUNTER — Ambulatory Visit (INDEPENDENT_AMBULATORY_CARE_PROVIDER_SITE_OTHER): Payer: Medicare Other | Admitting: Internal Medicine

## 2016-03-08 ENCOUNTER — Other Ambulatory Visit (INDEPENDENT_AMBULATORY_CARE_PROVIDER_SITE_OTHER): Payer: Medicare Other

## 2016-03-08 VITALS — BP 110/70 | HR 66 | Temp 98.3°F | Resp 16 | Ht 63.0 in | Wt 243.5 lb

## 2016-03-08 DIAGNOSIS — D509 Iron deficiency anemia, unspecified: Secondary | ICD-10-CM

## 2016-03-08 DIAGNOSIS — E034 Atrophy of thyroid (acquired): Secondary | ICD-10-CM | POA: Diagnosis not present

## 2016-03-08 DIAGNOSIS — R739 Hyperglycemia, unspecified: Secondary | ICD-10-CM

## 2016-03-08 DIAGNOSIS — E785 Hyperlipidemia, unspecified: Secondary | ICD-10-CM | POA: Diagnosis not present

## 2016-03-08 DIAGNOSIS — M170 Bilateral primary osteoarthritis of knee: Secondary | ICD-10-CM

## 2016-03-08 DIAGNOSIS — I10 Essential (primary) hypertension: Secondary | ICD-10-CM

## 2016-03-08 DIAGNOSIS — E038 Other specified hypothyroidism: Secondary | ICD-10-CM

## 2016-03-08 DIAGNOSIS — D519 Vitamin B12 deficiency anemia, unspecified: Secondary | ICD-10-CM | POA: Diagnosis not present

## 2016-03-08 DIAGNOSIS — K529 Noninfective gastroenteritis and colitis, unspecified: Secondary | ICD-10-CM

## 2016-03-08 DIAGNOSIS — Z23 Encounter for immunization: Secondary | ICD-10-CM | POA: Diagnosis not present

## 2016-03-08 DIAGNOSIS — M5416 Radiculopathy, lumbar region: Secondary | ICD-10-CM

## 2016-03-08 DIAGNOSIS — E66813 Obesity, class 3: Secondary | ICD-10-CM

## 2016-03-08 LAB — LIPID PANEL
CHOLESTEROL: 155 mg/dL (ref 0–200)
HDL: 53.3 mg/dL (ref >39.00)
LDL CALC: 68 mg/dL (ref 0–99)
NONHDL: 101.79
Total CHOL/HDL Ratio: 3
Triglycerides: 169 mg/dL — ABNORMAL HIGH (ref 0.0–149.0)
VLDL: 33.8 mg/dL (ref 0.0–40.0)

## 2016-03-08 LAB — COMPREHENSIVE METABOLIC PANEL
ALBUMIN: 4.3 g/dL (ref 3.5–5.2)
ALT: 22 U/L (ref 0–35)
AST: 15 U/L (ref 0–37)
Alkaline Phosphatase: 84 U/L (ref 39–117)
BUN: 14 mg/dL (ref 6–23)
CHLORIDE: 105 meq/L (ref 96–112)
CO2: 28 mEq/L (ref 19–32)
CREATININE: 0.95 mg/dL (ref 0.40–1.20)
Calcium: 9 mg/dL (ref 8.4–10.5)
GFR: 77.61 mL/min (ref >60.00)
GLUCOSE: 103 mg/dL — AB (ref 70–99)
POTASSIUM: 3.4 meq/L — AB (ref 3.5–5.1)
SODIUM: 137 meq/L (ref 135–145)
TOTAL PROTEIN: 7.5 g/dL (ref 6.0–8.3)
Total Bilirubin: 0.3 mg/dL (ref 0.2–1.2)

## 2016-03-08 LAB — CBC WITH DIFFERENTIAL/PLATELET
BASOS PCT: 0.3 % (ref 0.0–3.0)
Basophils Absolute: 0 10*3/uL (ref 0.0–0.1)
EOS ABS: 0.1 10*3/uL (ref 0.0–0.7)
EOS PCT: 0.9 % (ref 0.0–5.0)
HCT: 37.8 % (ref 36.0–46.0)
Hemoglobin: 12.6 g/dL (ref 12.0–15.0)
LYMPHS ABS: 2.9 10*3/uL (ref 0.7–4.0)
Lymphocytes Relative: 36.3 % (ref 12.0–46.0)
MCHC: 33.2 g/dL (ref 30.0–36.0)
MCV: 87.3 fl (ref 78.0–100.0)
MONO ABS: 0.7 10*3/uL (ref 0.1–1.0)
Monocytes Relative: 8.5 % (ref 3.0–12.0)
NEUTROS PCT: 54 % (ref 43.0–77.0)
Neutro Abs: 4.3 10*3/uL (ref 1.4–7.7)
PLATELETS: 261 10*3/uL (ref 150.0–400.0)
RBC: 4.33 Mil/uL (ref 3.87–5.11)
RDW: 15.1 % (ref 11.5–15.5)
WBC: 7.9 10*3/uL (ref 4.0–10.5)

## 2016-03-08 LAB — TSH: TSH: 1.22 u[IU]/mL (ref 0.35–4.50)

## 2016-03-08 LAB — HEMOGLOBIN A1C: HEMOGLOBIN A1C: 6 % (ref 4.6–6.5)

## 2016-03-08 MED ORDER — HYDROCODONE-ACETAMINOPHEN 5-325 MG PO TABS: 1.0000 | ORAL_TABLET | Freq: Four times a day (QID) | ORAL | 0 refills | Status: DC | PRN

## 2016-03-08 MED ORDER — POTASSIUM CHLORIDE CRYS ER 20 MEQ PO TBCR: 20.0000 meq | EXTENDED_RELEASE_TABLET | Freq: Two times a day (BID) | ORAL | 1 refills | Status: DC

## 2016-03-08 NOTE — Progress Notes (Signed)
Subjective:  Patient ID: Danielle Holt, female    DOB: January 08, 1956  Age: 60 y.o. MRN: [id number]  CC: Hypertension and Osteoarthritis   HPI Danielle Holt presents for a blood pressure check as well as concerns about arthritis pain. She complains of a burning pain in both knees that limits her ability to do her activities of daily living. She also complains of nonradiating low back pain. She denies paresthesias in her lower extremities. Over the last year I had stopped prescribing Norco for her because her previous drug screen was negative for hydrocodone but it was positive for butalbital. She tells me the pain is significantly interfering with her life.  She tells me her blood pressure has been well controlled on the combination of metoprolol, amlodipine, triamterene, and hydrochlorothiazide. She denies any recent episodes of headache/blurred vision/chest past pain/shortness of breath/palpitations/edema/fatigue.  Outpatient Medications Prior to Visit  Medication Sig Dispense Refill  . amLODipine (NORVASC) 2.5 MG tablet TAKE 1 TABLET BY MOUTH EVERY DAY 90 tablet 3  . atorvastatin (LIPITOR) 20 MG tablet TAKE 1 TABLET BY MOUTH EVERY NIGHT AT BEDTIME 90 tablet 3  . buPROPion (WELLBUTRIN XL) 150 MG 24 hr tablet TK 2 TS PO QD  1  . busPIRone (BUSPAR) 10 MG tablet Take 1/2 tab 3 times a day for a week, then 1 tab 3 times a day    . famotidine (PEPCID) 40 MG tablet Take 1 tablet (40 mg total) by mouth at bedtime. 30 tablet 6  . ferrous sulfate 325 (65 FE) MG tablet Take 1 tablet (325 mg total) by mouth daily with breakfast. 90 tablet 3  . Fluticasone-Salmeterol (ADVAIR DISKUS) 250-50 MCG/DOSE AEPB Inhale 1 puff into the lungs 2 (two) times daily. 60 each 11  . losartan (COZAAR) 50 MG tablet Take 1 tablet (50 mg total) by mouth daily. 90 tablet 3  . metoprolol succinate (TOPROL-XL) 50 MG 24 hr tablet Take 1 tablet (50 mg total) by mouth daily. Take with or immediately following a meal. 90  tablet 2  . naproxen (NAPROSYN) 500 MG tablet Take as directed with sumatriptan 10 tablet 2  . nortriptyline (PAMELOR) 25 MG capsule TAKE 1 CAPSULE(25 MG) BY MOUTH AT BEDTIME 90 capsule 0  . pantoprazole (PROTONIX) 40 MG tablet TAKE 1 TABLET(40 MG) BY MOUTH DAILY 90 tablet 0  . PROAIR HFA 108 (90 Base) MCG/ACT inhaler USE 1 PUFF BY MOUTH FOUR TIMES DAILY AS NEEDED 8.5 g 0  . ziprasidone (GEODON) 40 MG capsule TAKE 1 CAPSULE BY MOUTH AT BEDTIME 90 capsule 3  . tiZANidine (ZANAFLEX) 4 MG tablet Take 1 tablet (4 mg total) by mouth 2 (two) times daily as needed. 60 tablet 11  . triamterene-hydrochlorothiazide (MAXZIDE-25) 37.5-25 MG per tablet TK 1 T PO QD  2  . Levothyroxine Sodium 13 MCG CAPS Take 1 capsule (13 mcg total) by mouth daily. (Patient not taking: Reported on 03/08/2016) 30 capsule 5  . SUMAtriptan (IMITREX) 100 MG tablet Take 1 tablet (100 mg total) by mouth every 2 (two) hours as needed for migraine. May repeat in 2 hours if headache persists or recurs. (Patient not taking: Reported on 03/08/2016) 10 tablet 3  . cholestyramine (QUESTRAN) 4 g packet Take 1 packet (4 g total) by mouth 2 (two) times daily. (Patient not taking: Reported on 03/08/2016) 60 each 3  . Eluxadoline (VIBERZI) 75 MG TABS Take 75 mg by mouth 2 (two) times daily. (Patient not taking: Reported on 03/08/2016) 180 tablet 1  .  potassium chloride SA (K-DUR,KLOR-CON) 20 MEQ tablet Reported on 09/16/2015  1   No facility-administered medications prior to visit.     ROS Review of Systems  Constitutional: Negative.  Negative for activity change, appetite change, chills, diaphoresis, fatigue and fever.  HENT: Negative.  Negative for trouble swallowing.   Eyes: Negative.  Negative for visual disturbance.  Respiratory: Negative.  Negative for cough, choking, chest tightness, shortness of breath and stridor.   Cardiovascular: Negative.  Negative for chest pain, palpitations and leg swelling.  Gastrointestinal: Negative for  abdominal pain, blood in stool, constipation, diarrhea, nausea and vomiting.  Endocrine: Negative.   Genitourinary: Negative.  Negative for difficulty urinating, dysuria and hematuria.  Musculoskeletal: Positive for arthralgias and back pain. Negative for joint swelling, myalgias and neck pain.  Skin: Negative.  Negative for color change and rash.  Allergic/Immunologic: Negative.   Neurological: Negative.  Negative for dizziness, tremors, light-headedness, numbness and headaches.  Hematological: Negative.  Negative for adenopathy. Does not bruise/bleed easily.  Psychiatric/Behavioral: Negative.     Objective:  BP 110/70 (BP Location: Left Arm, Patient Position: Sitting, Cuff Size: Large)   Pulse 66   Temp 98.3 F (36.8 C) (Oral)   Resp 16   Ht 5\' 3"  (1.6 m)   Wt 243 lb 8 oz (110.5 kg)   SpO2 98%   BMI 43.13 kg/m   BP Readings from Last 3 Encounters:  03/08/16 110/70  09/22/15 (!) 188/74  09/16/15 110/80    Wt Readings from Last 3 Encounters:  03/08/16 243 lb 8 oz (110.5 kg)  09/22/15 249 lb 2 oz (113 kg)  09/16/15 247 lb (112 kg)    Physical Exam  Constitutional: She is oriented to person, place, and time. No distress.  HENT:  Mouth/Throat: Oropharynx is clear and moist. No oropharyngeal exudate.  Eyes: Conjunctivae are normal. Right eye exhibits no discharge. Left eye exhibits no discharge. No scleral icterus.  Neck: Normal range of motion. Neck supple. No JVD present. No tracheal deviation present. No thyromegaly present.  Cardiovascular: Normal rate, normal heart sounds and intact distal pulses.  Exam reveals no gallop and no friction rub.   No murmur heard. Pulmonary/Chest: Effort normal and breath sounds normal. No stridor. No respiratory distress. She has no wheezes. She has no rales. She exhibits no tenderness.  Abdominal: Soft. Bowel sounds are normal. She exhibits no distension and no mass. There is no tenderness. There is no rebound and no guarding.    Musculoskeletal: Normal range of motion. She exhibits no edema, tenderness or deformity.       Right knee: Normal. She exhibits normal range of motion, no swelling, no deformity and no erythema. No tenderness found.       Left knee: Normal. She exhibits normal range of motion, no swelling, no effusion, no deformity and no erythema. No tenderness found.  Lymphadenopathy:    She has no cervical adenopathy.  Neurological: She is oriented to person, place, and time. She displays no atrophy. No cranial nerve deficit or sensory deficit. She exhibits normal muscle tone. She displays a negative Romberg sign. She displays no seizure activity. Coordination normal.  Neg SLR in BLE  Skin: Skin is warm and dry. No rash noted. She is not diaphoretic. No erythema. No pallor.  Vitals reviewed.   Lab Results  Component Value Date   WBC 7.9 03/08/2016   HGB 12.6 03/08/2016   HCT 37.8 03/08/2016   PLT 261.0 03/08/2016   GLUCOSE 103 (H) 03/08/2016   CHOL  155 03/08/2016   TRIG 169.0 (H) 03/08/2016   HDL 53.30 03/08/2016   LDLDIRECT 105.9 10/29/2012   LDLCALC 68 03/08/2016   ALT 22 03/08/2016   AST 15 03/08/2016   NA 137 03/08/2016   K 3.4 (L) 03/08/2016   CL 105 03/08/2016   CREATININE 0.95 03/08/2016   BUN 14 03/08/2016   CO2 28 03/08/2016   TSH 1.22 03/08/2016   HGBA1C 6.0 03/08/2016    Mm Digital Screening Bilateral  Result Date: 08/12/2015 CLINICAL DATA:  Screening. EXAM: DIGITAL SCREENING BILATERAL MAMMOGRAM WITH CAD COMPARISON:  Previous exam(s). ACR Breast Density Category b: There are scattered areas of fibroglandular density. FINDINGS: There are no findings suspicious for malignancy. Images were processed with CAD. IMPRESSION: No mammographic evidence of malignancy. A result letter of this screening mammogram will be mailed directly to the patient. RECOMMENDATION: Screening mammogram in one year. (Code:SM-B-01Y) BI-RADS CATEGORY  1: Negative. Electronically Signed   By: Gerome Sam III  M.D   On: 08/12/2015 07:17    Assessment & Plan:   Danielle Holt was seen today for hypertension and osteoarthritis.  Diagnoses and all orders for this visit:  Need for prophylactic vaccination and inoculation against influenza -     Flu Vaccine QUAD 36+ mos IM  Essential hypertension, benign- her blood pressures well controlled, her potassium level is slightly low so I've asked her to restart potassium replacement therapy. -     Comprehensive metabolic panel; Future -     potassium chloride SA (KLOR-CON M20) 20 MEQ tablet; Take 1 tablet (20 mEq total) by mouth 2 (two) times daily.  Chronic diarrhea  Hypothyroidism due to acquired atrophy of thyroid- her TSH is in the normal range, she will remain on the current dose of levothyroxine. -     TSH; Future  Obesity, Class III, BMI 40-49.9 (morbid obesity) (HCC)- now that she will be treated more aggressively for arthritis pain she agrees to be more active in an attempt to lose weight.  Vitamin B12 deficiency anemia- she will continue monthly B12 injections. -     CBC with Differential/Platelet; Future  Hyperglycemia- she is prediabetic, no medications are needed at this time, she will improve her lifestyle modifications to lower her blood sugar. -     Hemoglobin A1c; Future  Hyperlipidemia with target LDL less than 100 -     Lipid panel; Future -     TSH; Future  Anemia, iron deficiency- improvement noted. -     CBC with Differential/Platelet; Future  Primary osteoarthritis of both knees- she will restart taking Norco for pain relief. She commits to me that she will stop taking butalbital. If on her follow-up drug screens she is positive for butalbital then again, I will discontinue the hydrocodone because I told her I don't want her take this combination together due to increased risk of complications. She agrees to this plan. -     Discontinue: HYDROcodone-acetaminophen (NORCO/VICODIN) 5-325 MG tablet; Take 1 tablet by mouth every 6  (six) hours as needed for moderate pain. -     Discontinue: HYDROcodone-acetaminophen (NORCO/VICODIN) 5-325 MG tablet; Take 1 tablet by mouth every 6 (six) hours as needed for moderate pain. -     Discontinue: HYDROcodone-acetaminophen (NORCO/VICODIN) 5-325 MG tablet; Take 1 tablet by mouth every 6 (six) hours as needed for moderate pain. -     HYDROcodone-acetaminophen (NORCO/VICODIN) 5-325 MG tablet; Take 1 tablet by mouth every 6 (six) hours as needed for moderate pain.  Left lumbar radiculitis- as  above -     Discontinue: HYDROcodone-acetaminophen (NORCO/VICODIN) 5-325 MG tablet; Take 1 tablet by mouth every 6 (six) hours as needed for moderate pain. -     Discontinue: HYDROcodone-acetaminophen (NORCO/VICODIN) 5-325 MG tablet; Take 1 tablet by mouth every 6 (six) hours as needed for moderate pain. -     Discontinue: HYDROcodone-acetaminophen (NORCO/VICODIN) 5-325 MG tablet; Take 1 tablet by mouth every 6 (six) hours as needed for moderate pain. -     HYDROcodone-acetaminophen (NORCO/VICODIN) 5-325 MG tablet; Take 1 tablet by mouth every 6 (six) hours as needed for moderate pain.   I have discontinued Ms. Moure's tiZANidine, triamterene-hydrochlorothiazide, potassium chloride SA, cholestyramine, Eluxadoline, and butalbital-acetaminophen-caffeine. I am also having her start on potassium chloride SA. Additionally, I am having her maintain her Fluticasone-Salmeterol, Levothyroxine Sodium, ferrous sulfate, losartan, ziprasidone, buPROPion, busPIRone, SUMAtriptan, amLODipine, atorvastatin, metoprolol succinate, naproxen, nortriptyline, famotidine, pantoprazole, PROAIR HFA, clonazePAM, oxyCODONE-acetaminophen, phentermine, topiramate, furosemide, and HYDROcodone-acetaminophen.  Meds ordered this encounter  Medications  . DISCONTD: butalbital-acetaminophen-caffeine (FIORICET, ESGIC) 50-325-40 MG tablet    Sig: Take by mouth.  . clonazePAM (KLONOPIN) 1 MG tablet    Sig: Take by mouth.  .  oxyCODONE-acetaminophen (PERCOCET) 7.5-325 MG tablet    Sig: Take by mouth.  . phentermine (ADIPEX-P) 37.5 MG tablet    Sig: TK 1 T PO QD    Refill:  0  . topiramate (TOPAMAX) 100 MG tablet    Sig: TK 1 T PO D    Refill:  0  . furosemide (LASIX) 20 MG tablet  . DISCONTD: HYDROcodone-acetaminophen (NORCO/VICODIN) 5-325 MG tablet    Sig: Take 1 tablet by mouth every 6 (six) hours as needed for moderate pain.    Dispense:  65 tablet    Refill:  0  . DISCONTD: HYDROcodone-acetaminophen (NORCO/VICODIN) 5-325 MG tablet    Sig: Take 1 tablet by mouth every 6 (six) hours as needed for moderate pain.    Dispense:  65 tablet    Refill:  0  . DISCONTD: HYDROcodone-acetaminophen (NORCO/VICODIN) 5-325 MG tablet    Sig: Take 1 tablet by mouth every 6 (six) hours as needed for moderate pain.    Dispense:  65 tablet    Refill:  0  . HYDROcodone-acetaminophen (NORCO/VICODIN) 5-325 MG tablet    Sig: Take 1 tablet by mouth every 6 (six) hours as needed for moderate pain.    Dispense:  65 tablet    Refill:  0  . potassium chloride SA (KLOR-CON M20) 20 MEQ tablet    Sig: Take 1 tablet (20 mEq total) by mouth 2 (two) times daily.    Dispense:  180 tablet    Refill:  1     Follow-up: Return in about 4 months (around 07/08/2016).  Sanda Linger, MD

## 2016-03-08 NOTE — Telephone Encounter (Signed)
Pt informed and [facility].

## 2016-03-08 NOTE — Progress Notes (Signed)
Pre visit review using our clinic review tool, if applicable. No additional management support is needed unless otherwise documented below in the visit note. 

## 2016-03-08 NOTE — Telephone Encounter (Signed)
Not able to complete due to pt is seeing a MD for disability and she has never been seen for the reasons she states she is disabled.

## 2016-03-08 NOTE — Telephone Encounter (Signed)
Pt in today for OV and brought disability form to be completed.

## 2016-03-08 NOTE — Patient Instructions (Signed)
Hypertension Hypertension, commonly called high blood pressure, is when the force of blood pumping through your arteries is too strong. Your arteries are the blood vessels that carry blood from your heart throughout your body. A blood pressure reading consists of a higher number over a lower number, such as 110/72. The higher number (systolic) is the pressure inside your arteries when your heart pumps. The lower number (diastolic) is the pressure inside your arteries when your heart relaxes. Ideally you want your blood pressure below 120/80. Hypertension forces your heart to work harder to pump blood. Your arteries may become narrow or stiff. Having untreated or uncontrolled hypertension can cause heart attack, stroke, kidney disease, and other problems. RISK FACTORS Some risk factors for high blood pressure are controllable. Others are not.  Risk factors you cannot control include:   Race. You may be at higher risk if you are African American.  Age. Risk increases with age.  Gender. Men are at higher risk than women before age 45 years. After age 65, women are at higher risk than men. Risk factors you can control include:  Not getting enough exercise or physical activity.  Being overweight.  Getting too much fat, sugar, calories, or salt in your diet.  Drinking too much alcohol. SIGNS AND SYMPTOMS Hypertension does not usually cause signs or symptoms. Extremely high blood pressure (hypertensive crisis) may cause headache, anxiety, shortness of breath, and nosebleed. DIAGNOSIS To check if you have hypertension, your health care provider will measure your blood pressure while you are seated, with your arm held at the level of your heart. It should be measured at least twice using the same arm. Certain conditions can cause a difference in blood pressure between your right and left arms. A blood pressure reading that is higher than normal on one occasion does not mean that you need treatment. If  it is not clear whether you have high blood pressure, you may be asked to return on a different day to have your blood pressure checked again. Or, you may be asked to monitor your blood pressure at home for 1 or more weeks. TREATMENT Treating high blood pressure includes making lifestyle changes and possibly taking medicine. Living a healthy lifestyle can help lower high blood pressure. You may need to change some of your habits. Lifestyle changes may include:  Following the DASH diet. This diet is high in fruits, vegetables, and whole grains. It is low in salt, red meat, and added sugars.  Keep your sodium intake below 2,300 mg per day.  Getting at least 30-45 minutes of aerobic exercise at least 4 times per week.  Losing weight if necessary.  Not smoking.  Limiting alcoholic beverages.  Learning ways to reduce stress. Your health care provider may prescribe medicine if lifestyle changes are not enough to get your blood pressure under control, and if one of the following is true:  You are 18-59 years of age and your systolic blood pressure is above 140.  You are 60 years of age or older, and your systolic blood pressure is above 150.  Your diastolic blood pressure is above 90.  You have diabetes, and your systolic blood pressure is over 140 or your diastolic blood pressure is over 90.  You have kidney disease and your blood pressure is above 140/90.  You have heart disease and your blood pressure is above 140/90. Your personal target blood pressure may vary depending on your medical conditions, your age, and other factors. HOME CARE INSTRUCTIONS    Have your blood pressure rechecked as directed by your health care provider.   Take medicines only as directed by your health care provider. Follow the directions carefully. Blood pressure medicines must be taken as prescribed. The medicine does not work as well when you skip doses. Skipping doses also puts you at risk for  problems.  Do not smoke.   Monitor your blood pressure at home as directed by your health care provider. SEEK MEDICAL CARE IF:   You think you are having a reaction to medicines taken.  You have recurrent headaches or feel dizzy.  You have swelling in your ankles.  You have trouble with your vision. SEEK IMMEDIATE MEDICAL CARE IF:  You develop a severe headache or confusion.  You have unusual weakness, numbness, or feel faint.  You have severe chest or abdominal pain.  You vomit repeatedly.  You have trouble breathing. MAKE SURE YOU:   Understand these instructions.  Will watch your condition.  Will get help right away if you are not doing well or get worse.   This information is not intended to replace advice given to you by your health care provider. Make sure you discuss any questions you have with your health care provider.   Document Released: 06/26/2005 Document Revised: 11/10/2014 Document Reviewed: 04/18/2013 Elsevier Interactive Patient Education 2016 [employer]  

## 2016-03-09 ENCOUNTER — Encounter: Payer: Self-pay | Admitting: Internal Medicine

## 2016-03-26 ENCOUNTER — Other Ambulatory Visit: Payer: Self-pay | Admitting: Internal Medicine

## 2016-04-13 ENCOUNTER — Other Ambulatory Visit: Payer: Self-pay | Admitting: Internal Medicine

## 2016-05-15 ENCOUNTER — Institutional Professional Consult (permissible substitution): Payer: Medicare Other | Admitting: Internal Medicine

## 2016-05-15 DIAGNOSIS — I10 Essential (primary) hypertension: Secondary | ICD-10-CM | POA: Diagnosis not present

## 2016-05-15 DIAGNOSIS — E785 Hyperlipidemia, unspecified: Secondary | ICD-10-CM | POA: Diagnosis not present

## 2016-05-15 DIAGNOSIS — K219 Gastro-esophageal reflux disease without esophagitis: Secondary | ICD-10-CM | POA: Diagnosis not present

## 2016-05-23 DIAGNOSIS — I517 Cardiomegaly: Secondary | ICD-10-CM | POA: Diagnosis not present

## 2016-06-05 ENCOUNTER — Telehealth: Payer: Self-pay

## 2016-06-05 ENCOUNTER — Other Ambulatory Visit: Payer: Self-pay | Admitting: Internal Medicine

## 2016-06-05 MED ORDER — PANTOPRAZOLE SODIUM 40 MG PO TBEC: DELAYED_RELEASE_TABLET | ORAL | 3 refills | Status: AC

## 2016-06-05 NOTE — Telephone Encounter (Signed)
[facility] Last fill in May 2017. Please advise.

## 2016-06-22 DIAGNOSIS — E785 Hyperlipidemia, unspecified: Secondary | ICD-10-CM | POA: Diagnosis not present

## 2016-06-22 DIAGNOSIS — I10 Essential (primary) hypertension: Secondary | ICD-10-CM | POA: Diagnosis not present

## 2016-06-24 ENCOUNTER — Other Ambulatory Visit: Payer: Self-pay | Admitting: Internal Medicine

## 2016-07-05 ENCOUNTER — Other Ambulatory Visit: Payer: Self-pay | Admitting: Internal Medicine

## 2016-07-10 HISTORY — PX: OTHER SURGICAL HISTORY: SHX169

## 2016-07-11 ENCOUNTER — Ambulatory Visit: Payer: Medicare Other | Admitting: Internal Medicine

## 2016-07-17 ENCOUNTER — Ambulatory Visit: Payer: Medicare Other | Admitting: Internal Medicine

## 2016-07-19 DIAGNOSIS — Z01818 Encounter for other preprocedural examination: Secondary | ICD-10-CM | POA: Diagnosis not present

## 2016-07-19 DIAGNOSIS — K2950 Unspecified chronic gastritis without bleeding: Secondary | ICD-10-CM | POA: Diagnosis not present

## 2016-07-19 DIAGNOSIS — E039 Hypothyroidism, unspecified: Secondary | ICD-10-CM | POA: Diagnosis not present

## 2016-07-19 DIAGNOSIS — M1990 Unspecified osteoarthritis, unspecified site: Secondary | ICD-10-CM | POA: Diagnosis not present

## 2016-07-19 DIAGNOSIS — G4733 Obstructive sleep apnea (adult) (pediatric): Secondary | ICD-10-CM | POA: Diagnosis not present

## 2016-07-19 DIAGNOSIS — J449 Chronic obstructive pulmonary disease, unspecified: Secondary | ICD-10-CM | POA: Diagnosis not present

## 2016-07-19 DIAGNOSIS — E785 Hyperlipidemia, unspecified: Secondary | ICD-10-CM | POA: Diagnosis not present

## 2016-07-19 DIAGNOSIS — K219 Gastro-esophageal reflux disease without esophagitis: Secondary | ICD-10-CM | POA: Diagnosis not present

## 2016-07-19 DIAGNOSIS — K229 Disease of esophagus, unspecified: Secondary | ICD-10-CM | POA: Diagnosis not present

## 2016-07-19 DIAGNOSIS — K449 Diaphragmatic hernia without obstruction or gangrene: Secondary | ICD-10-CM | POA: Diagnosis not present

## 2016-07-19 DIAGNOSIS — K2900 Acute gastritis without bleeding: Secondary | ICD-10-CM | POA: Diagnosis not present

## 2016-07-19 DIAGNOSIS — Z79899 Other long term (current) drug therapy: Secondary | ICD-10-CM | POA: Diagnosis not present

## 2016-07-19 DIAGNOSIS — I10 Essential (primary) hypertension: Secondary | ICD-10-CM | POA: Diagnosis not present

## 2016-07-19 DIAGNOSIS — Z87891 Personal history of nicotine dependence: Secondary | ICD-10-CM | POA: Diagnosis not present

## 2016-07-20 ENCOUNTER — Ambulatory Visit: Payer: Medicare Other | Admitting: Internal Medicine

## 2016-07-24 ENCOUNTER — Encounter: Payer: Self-pay | Admitting: Internal Medicine

## 2016-07-24 ENCOUNTER — Ambulatory Visit (INDEPENDENT_AMBULATORY_CARE_PROVIDER_SITE_OTHER): Payer: Medicare Other | Admitting: Internal Medicine

## 2016-07-24 ENCOUNTER — Other Ambulatory Visit (HOSPITAL_COMMUNITY)
Admission: RE | Admit: 2016-07-24 | Discharge: 2016-07-24 | Disposition: A | Discharge status: Home / Self Care | Payer: Medicare Other | Source: Ambulatory Visit | Attending: Internal Medicine | Admitting: Internal Medicine

## 2016-07-24 ENCOUNTER — Other Ambulatory Visit (INDEPENDENT_AMBULATORY_CARE_PROVIDER_SITE_OTHER): Payer: Medicare Other

## 2016-07-24 VITALS — BP 128/80 | HR 69 | Temp 99.0°F | Ht 63.0 in | Wt 256.0 lb

## 2016-07-24 DIAGNOSIS — Z1151 Encounter for screening for human papillomavirus (HPV): Secondary | ICD-10-CM | POA: Insufficient documentation

## 2016-07-24 DIAGNOSIS — Z0001 Encounter for general adult medical examination with abnormal findings: Secondary | ICD-10-CM

## 2016-07-24 DIAGNOSIS — E038 Other specified hypothyroidism: Secondary | ICD-10-CM

## 2016-07-24 DIAGNOSIS — Z01419 Encounter for gynecological examination (general) (routine) without abnormal findings: Secondary | ICD-10-CM | POA: Insufficient documentation

## 2016-07-24 DIAGNOSIS — E876 Hypokalemia: Secondary | ICD-10-CM | POA: Diagnosis not present

## 2016-07-24 DIAGNOSIS — D508 Other iron deficiency anemias: Secondary | ICD-10-CM

## 2016-07-24 DIAGNOSIS — I10 Essential (primary) hypertension: Secondary | ICD-10-CM

## 2016-07-24 DIAGNOSIS — M5416 Radiculopathy, lumbar region: Secondary | ICD-10-CM

## 2016-07-24 DIAGNOSIS — D510 Vitamin B12 deficiency anemia due to intrinsic factor deficiency: Secondary | ICD-10-CM

## 2016-07-24 DIAGNOSIS — Z124 Encounter for screening for malignant neoplasm of cervix: Secondary | ICD-10-CM

## 2016-07-24 DIAGNOSIS — Z0000 Encounter for general adult medical examination without abnormal findings: Secondary | ICD-10-CM

## 2016-07-24 DIAGNOSIS — M170 Bilateral primary osteoarthritis of knee: Secondary | ICD-10-CM

## 2016-07-24 LAB — HM PAP SMEAR

## 2016-07-24 LAB — COMPREHENSIVE METABOLIC PANEL
ALT: 13 U/L (ref 0–35)
AST: 14 U/L (ref 0–37)
Albumin: 4 g/dL (ref 3.5–5.2)
Alkaline Phosphatase: 89 U/L (ref 39–117)
BILIRUBIN TOTAL: 0.3 mg/dL (ref 0.2–1.2)
BUN: 21 mg/dL (ref 6–23)
CO2: 27 meq/L (ref 19–32)
CREATININE: 1.1 mg/dL (ref 0.40–1.20)
Calcium: 9.4 mg/dL (ref 8.4–10.5)
Chloride: 103 mEq/L (ref 96–112)
GFR: 65.45 mL/min (ref >60.00)
GLUCOSE: 113 mg/dL — AB (ref 70–99)
Potassium: 4.3 mEq/L (ref 3.5–5.1)
SODIUM: 137 meq/L (ref 135–145)
Total Protein: 7.4 g/dL (ref 6.0–8.3)

## 2016-07-24 LAB — CBC WITH DIFFERENTIAL/PLATELET
BASOS ABS: 0 10*3/uL (ref 0.0–0.1)
Basophils Relative: 0.6 % (ref 0.0–3.0)
Eosinophils Absolute: 0.1 10*3/uL (ref 0.0–0.7)
Eosinophils Relative: 1 % (ref 0.0–5.0)
HCT: 38.7 % (ref 36.0–46.0)
Hemoglobin: 12.8 g/dL (ref 12.0–15.0)
LYMPHS ABS: 2.6 10*3/uL (ref 0.7–4.0)
Lymphocytes Relative: 31.9 % (ref 12.0–46.0)
MCHC: 33.2 g/dL (ref 30.0–36.0)
MCV: 86.3 fl (ref 78.0–100.0)
MONOS PCT: 9.7 % (ref 3.0–12.0)
Monocytes Absolute: 0.8 10*3/uL (ref 0.1–1.0)
NEUTROS ABS: 4.6 10*3/uL (ref 1.4–7.7)
NEUTROS PCT: 56.8 % (ref 43.0–77.0)
PLATELETS: 293 10*3/uL (ref 150.0–400.0)
RBC: 4.48 Mil/uL (ref 3.87–5.11)
RDW: 15.6 % — ABNORMAL HIGH (ref 11.5–15.5)
WBC: 8.1 10*3/uL (ref 4.0–10.5)

## 2016-07-24 LAB — FERRITIN: FERRITIN: 150.7 ng/mL (ref 10.0–291.0)

## 2016-07-24 LAB — IBC PANEL
IRON: 54 ug/dL (ref 42–145)
SATURATION RATIOS: 12.2 % — AB (ref 20.0–50.0)
Transferrin: 316 mg/dL (ref 212.0–360.0)

## 2016-07-24 LAB — VITAMIN B12: VITAMIN B 12: 704 pg/mL (ref 211–911)

## 2016-07-24 LAB — FOLATE: Folate: 9.7 ng/mL (ref >5.9)

## 2016-07-24 LAB — MAGNESIUM: Magnesium: 2.1 mg/dL (ref 1.5–2.5)

## 2016-07-24 LAB — TSH: TSH: 2.84 u[IU]/mL (ref 0.35–4.50)

## 2016-07-24 MED ORDER — HYDROCODONE-ACETAMINOPHEN 5-325 MG PO TABS: 1.0000 | ORAL_TABLET | Freq: Four times a day (QID) | ORAL | 0 refills | Status: DC | PRN

## 2016-07-24 MED ORDER — HYDROCODONE-ACETAMINOPHEN 7.5-325 MG PO TABS: 1.0000 | ORAL_TABLET | Freq: Four times a day (QID) | ORAL | 0 refills | Status: DC | PRN

## 2016-07-24 NOTE — Patient Instructions (Signed)
Hypertension Hypertension, commonly called high blood pressure, is when the force of blood pumping through your arteries is too strong. Your arteries are the blood vessels that carry blood from your heart throughout your body. A blood pressure reading consists of a higher number over a lower number, such as 110/72. The higher number (systolic) is the pressure inside your arteries when your heart pumps. The lower number (diastolic) is the pressure inside your arteries when your heart relaxes. Ideally you want your blood pressure below 120/80. Hypertension forces your heart to work harder to pump blood. Your arteries may become narrow or stiff. Having untreated or uncontrolled hypertension can cause heart attack, stroke, kidney disease, and other problems. What increases the risk? Some risk factors for high blood pressure are controllable. Others are not. Risk factors you cannot control include:  Race. You may be at higher risk if you are African American.  Age. Risk increases with age.  Gender. Men are at higher risk than women before age 45 years. After age 65, women are at higher risk than men. Risk factors you can control include:  Not getting enough exercise or physical activity.  Being overweight.  Getting too much fat, sugar, calories, or salt in your diet.  Drinking too much alcohol. What are the signs or symptoms? Hypertension does not usually cause signs or symptoms. Extremely high blood pressure (hypertensive crisis) may cause headache, anxiety, shortness of breath, and nosebleed. How is this diagnosed? To check if you have hypertension, your health care provider will measure your blood pressure while you are seated, with your arm held at the level of your heart. It should be measured at least twice using the same arm. Certain conditions can cause a difference in blood pressure between your right and left arms. A blood pressure reading that is higher than normal on one occasion does  not mean that you need treatment. If it is not clear whether you have high blood pressure, you may be asked to return on a different day to have your blood pressure checked again. Or, you may be asked to monitor your blood pressure at home for 1 or more weeks. How is this treated? Treating high blood pressure includes making lifestyle changes and possibly taking medicine. Living a healthy lifestyle can help lower high blood pressure. You may need to change some of your habits. Lifestyle changes may include:  Following the DASH diet. This diet is high in fruits, vegetables, and whole grains. It is low in salt, red meat, and added sugars.  Keep your sodium intake below 2,300 mg per day.  Getting at least 30-45 minutes of aerobic exercise at least 4 times per week.  Losing weight if necessary.  Not smoking.  Limiting alcoholic beverages.  Learning ways to reduce stress. Your health care provider may prescribe medicine if lifestyle changes are not enough to get your blood pressure under control, and if one of the following is true:  You are 18-59 years of age and your systolic blood pressure is above 140.  You are 60 years of age or older, and your systolic blood pressure is above 150.  Your diastolic blood pressure is above 90.  You have diabetes, and your systolic blood pressure is over 140 or your diastolic blood pressure is over 90.  You have kidney disease and your blood pressure is above 140/90.  You have heart disease and your blood pressure is above 140/90. Your personal target blood pressure may vary depending on your medical   conditions, your age, and other factors. Follow these instructions at home:  Have your blood pressure rechecked as directed by your health care provider.  Take medicines only as directed by your health care provider. Follow the directions carefully. Blood pressure medicines must be taken as prescribed. The medicine does not work as well when you skip  doses. Skipping doses also puts you at risk for problems.  Do not smoke.  Monitor your blood pressure at home as directed by your health care provider. Contact a health care provider if:  You think you are having a reaction to medicines taken.  You have recurrent headaches or feel dizzy.  You have swelling in your ankles.  You have trouble with your vision. Get help right away if:  You develop a severe headache or confusion.  You have unusual weakness, numbness, or feel faint.  You have severe chest or abdominal pain.  You vomit repeatedly.  You have trouble breathing. This information is not intended to replace advice given to you by your health care provider. Make sure you discuss any questions you have with your health care provider. Document Released: 06/26/2005 Document Revised: 12/02/2015 Document Reviewed: 04/18/2013 Elsevier Interactive Patient Education  2017 [employer]  

## 2016-07-24 NOTE — Progress Notes (Signed)
Subjective:  Patient ID: Danielle Holt, female    DOB: [birth year]  Age: 61 y.o. MRN: [id number]  CC: Hypothyroidism; Hypertension; and Annual Exam   HPI ARIETTA EISENSTEIN presents for an AWV/CPX.  She complains of chronic, unchanged low back pain and knee pain. She requests an increase in the dose of her Norco from 5 mg 3 times a day to 7.5 mg 3 times a day.  She tells me her blood pressure has been well controlled and she has had no secondary signs of hypertension such as headache/blurred vision/chest pain/shortness of breath/palpitations/edema/fatigue.  She is doing well on her statin with no muscle aches.  Past Medical History:  Diagnosis Date  . ALLERGIC RHINITIS   . Anemia   . Anxiety   . ARTHRITIS   . ASTHMA   . B12 DEFICIENCY   . CLUSTER HEADACHE   . Colon polyp 07/14/2014   Tubular adenoma  . COPD   . Depressive disorder, not elsewhere classified   . Gallstones   . GERD   . HYPERLIPIDEMIA   . Hypertension   . HYPOKALEMIA, MILD   . INSOMNIA   . Morbid obesity (HCC)   . TOBACCO USE    Past Surgical History:  Procedure Laterality Date  . APPENDECTOMY    . CARPAL TUNNEL RELEASE Bilateral   . CHOLECYSTECTOMY  1991  . KNEE ARTHROSCOPY Bilateral   . TONSILLECTOMY      reports that she has been smoking Cigarettes.  She has a 20.00 pack-year smoking history. She has never used smokeless tobacco. She reports that she does not drink alcohol or use drugs. family history is not on file. She was adopted. Allergies  Allergen Reactions  . Fentanyl Other (See Comments)    Hallucinations per pt  . Zolpidem     Other reaction(s): Other (See Comments) Hallucinations.  . Ativan [Lorazepam]     Memory loss  . Hyoscyamine Swelling  . Varenicline     REACTION: amnesia  . Varenicline Tartrate     REACTION: amnesia  . Zolpidem Tartrate     REACTION: Short term memory loss  . Amlodipine Other (See Comments)    Ankle edema  . Viibryd [Vilazodone Hcl]     Visual  disturbance    Outpatient Medications Prior to Visit  Medication Sig Dispense Refill  . atorvastatin (LIPITOR) 20 MG tablet TAKE 1 TABLET BY MOUTH EVERY NIGHT AT BEDTIME 90 tablet 3  . buPROPion (WELLBUTRIN XL) 150 MG 24 hr tablet TK 2 TS PO QD  1  . clonazePAM (KLONOPIN) 1 MG tablet Take by mouth.    . famotidine (PEPCID) 40 MG tablet TAKE 1 TABLET(40 MG) BY MOUTH AT BEDTIME 90 tablet 2  . ferrous sulfate 325 (65 FE) MG tablet Take 1 tablet (325 mg total) by mouth daily with breakfast. 90 tablet 3  . furosemide (LASIX) 20 MG tablet TAKE 1 TABLET BY MOUTH EVERY DAY AS NEEDED FOR SWELLING OF LEGS 90 tablet 1  . losartan (COZAAR) 50 MG tablet Take 1 tablet (50 mg total) by mouth daily. 90 tablet 3  . metoprolol succinate (TOPROL-XL) 50 MG 24 hr tablet TAKE 1 TABLET BY MOUTH DAILY WITH OR IMMEDIATELY FOLLOWING A MEAL 90 tablet 3  . pantoprazole (PROTONIX) 40 MG tablet TAKE 1 TABLET(40 MG) BY MOUTH DAILY 90 tablet 3  . potassium chloride SA (KLOR-CON M20) 20 MEQ tablet Take 1 tablet (20 mEq total) by mouth 2 (two) times daily. 180 tablet 1  . PROAIR  HFA 108 (90 Base) MCG/ACT inhaler USE 1 PUFF BY MOUTH FOUR TIMES DAILY AS NEEDED 8.5 g 0  . SUMAtriptan (IMITREX) 100 MG tablet Take 1 tablet (100 mg total) by mouth every 2 (two) hours as needed for migraine. May repeat in 2 hours if headache persists or recurs. 10 tablet 3  . topiramate (TOPAMAX) 100 MG tablet TK 1 T PO D  0  . ziprasidone (GEODON) 40 MG capsule TAKE 1 CAPSULE BY MOUTH AT BEDTIME 90 capsule 3  . amLODipine (NORVASC) 2.5 MG tablet Take 1 tablet (2.5 mg total) by mouth daily. 90 tablet 0  . busPIRone (BUSPAR) 10 MG tablet Take 1/2 tab 3 times a day for a week, then 1 tab 3 times a day    . Fluticasone-Salmeterol (ADVAIR DISKUS) 250-50 MCG/DOSE AEPB Inhale 1 puff into the lungs 2 (two) times daily. 60 each 11  . HYDROcodone-acetaminophen (NORCO/VICODIN) 5-325 MG tablet Take 1 tablet by mouth every 6 (six) hours as needed for moderate  pain. 65 tablet 0  . Levothyroxine Sodium 13 MCG CAPS Take 1 capsule (13 mcg total) by mouth daily. 30 capsule 5  . naproxen (NAPROSYN) 500 MG tablet Take as directed with sumatriptan 10 tablet 2  . nortriptyline (PAMELOR) 25 MG capsule TAKE 1 CAPSULE(25 MG) BY MOUTH AT BEDTIME 90 capsule 0  . oxyCODONE-acetaminophen (PERCOCET) 7.5-325 MG tablet Take by mouth.    . phentermine (ADIPEX-P) 37.5 MG tablet TK 1 T PO QD  0   No facility-administered medications prior to visit.     ROS Review of Systems  Constitutional: Negative for activity change, appetite change, diaphoresis, fatigue and unexpected weight change.  HENT: Negative.   Eyes: Negative for visual disturbance.  Respiratory: Negative for cough, chest tightness, shortness of breath and wheezing.   Cardiovascular: Negative for chest pain, palpitations and leg swelling.  Gastrointestinal: Negative for abdominal pain, constipation, diarrhea and nausea.  Endocrine: Negative.  Negative for cold intolerance and heat intolerance.  Genitourinary: Negative.  Negative for decreased urine volume, difficulty urinating, dysuria, menstrual problem, pelvic pain, urgency, vaginal bleeding and vaginal discharge.  Musculoskeletal: Positive for arthralgias and back pain. Negative for myalgias, neck pain and neck stiffness.  Skin: Negative.  Negative for color change, pallor and rash.  Allergic/Immunologic: Negative.   Neurological: Negative.  Negative for dizziness, weakness, numbness and headaches.  Hematological: Negative for adenopathy. Does not bruise/bleed easily.  Psychiatric/Behavioral: Negative.     Objective:  BP 128/80 (BP Location: Left Arm, Patient Position: Sitting, Cuff Size: Large)   Pulse 69   Temp 99 F (37.2 C)   Ht 5\' 3"  (1.6 m)   Wt 256 lb (116.1 kg)   SpO2 97%   BMI 45.35 kg/m   BP Readings from Last 3 Encounters:  07/24/16 128/80  03/08/16 110/70  09/22/15 (!) 188/74    Wt Readings from Last 3 Encounters:    07/24/16 256 lb (116.1 kg)  03/08/16 243 lb 8 oz (110.5 kg)  09/22/15 249 lb 2 oz (113 kg)    Physical Exam  Constitutional: She is oriented to person, place, and time. No distress.  HENT:  Mouth/Throat: Oropharynx is clear and moist. No oropharyngeal exudate.  Eyes: Conjunctivae are normal. Right eye exhibits no discharge. Left eye exhibits no discharge. No scleral icterus.  Neck: Normal range of motion. Neck supple. No JVD present. No tracheal deviation present. No thyromegaly present.  Cardiovascular: Normal rate, regular rhythm, normal heart sounds and intact distal pulses.  Exam reveals no  gallop and no friction rub.   No murmur heard. Pulmonary/Chest: Effort normal. No stridor. No respiratory distress. She has no wheezes. She has no rales. She exhibits no tenderness.  Abdominal: Soft. Bowel sounds are normal. She exhibits no distension and no mass. There is no tenderness. There is no rebound and no guarding. Hernia confirmed negative in the right inguinal area and confirmed negative in the left inguinal area.  Genitourinary: Rectum normal, vagina normal and uterus normal. Rectal exam shows no external hemorrhoid, no internal hemorrhoid, no fissure, no mass, no tenderness, anal tone normal and guaiac negative stool. No breast swelling, tenderness, discharge or bleeding. Pelvic exam was performed with patient supine. No labial fusion. There is no rash, tenderness, lesion or injury on the right labia. There is no rash, tenderness, lesion or injury on the left labia. Uterus is not deviated, not enlarged, not fixed and not tender. Cervix exhibits no motion tenderness, no discharge and no friability. Right adnexum displays no mass, no tenderness and no fullness. Left adnexum displays no mass, no tenderness and no fullness. No erythema, tenderness or bleeding in the vagina. No foreign body in the vagina. No signs of injury around the vagina. No vaginal discharge found.  Musculoskeletal: Normal range  of motion. She exhibits no edema, tenderness or deformity.  Lymphadenopathy:    She has no cervical adenopathy.       Right: No inguinal adenopathy present.       Left: No inguinal adenopathy present.  Neurological: She is oriented to person, place, and time.  Skin: Skin is warm and dry. No rash noted. She is not diaphoretic. No erythema. No pallor.  Psychiatric: She has a normal mood and affect. Her behavior is normal. Judgment and thought content normal.  Vitals reviewed.   Lab Results  Component Value Date   WBC 8.1 07/24/2016   HGB 12.8 07/24/2016   HCT 38.7 07/24/2016   PLT 293.0 07/24/2016   GLUCOSE 113 (H) 07/24/2016   CHOL 155 03/08/2016   TRIG 169.0 (H) 03/08/2016   HDL 53.30 03/08/2016   LDLDIRECT 105.9 10/29/2012   LDLCALC 68 03/08/2016   ALT 13 07/24/2016   AST 14 07/24/2016   NA 137 07/24/2016   K 4.3 07/24/2016   CL 103 07/24/2016   CREATININE 1.10 07/24/2016   BUN 21 07/24/2016   CO2 27 07/24/2016   TSH 2.84 07/24/2016   HGBA1C 6.0 03/08/2016    Mm Digital Screening Bilateral  Result Date: 08/12/2015 CLINICAL DATA:  Screening. EXAM: DIGITAL SCREENING BILATERAL MAMMOGRAM WITH CAD COMPARISON:  Previous exam(s). ACR Breast Density Category b: There are scattered areas of fibroglandular density. FINDINGS: There are no findings suspicious for malignancy. Images were processed with CAD. IMPRESSION: No mammographic evidence of malignancy. A result letter of this screening mammogram will be mailed directly to the patient. RECOMMENDATION: Screening mammogram in one year. (Code:SM-B-01Y) BI-RADS CATEGORY  1: Negative. Electronically Signed   By: Gerome Sam III M.D   On: 08/12/2015 07:17    Assessment & Plan:   Gita was seen today for hypothyroidism, hypertension and annual exam.  Diagnoses and all orders for this visit:  Essential hypertension, benign- her blood pressures adequately well-controlled, electrolytes and renal function are normal. -      Comprehensive metabolic panel; Future -     Magnesium; Future  Other iron deficiency anemia- improvment noted, will continue iron replacement therapy -     CBC with Differential/Platelet; Future -     IBC panel; Future -  Ferritin; Future  Vitamin B12 deficiency anemia due to intrinsic factor deficiency- improvement noted, will continue B12 replacement therapy -     CBC with Differential/Platelet; Future -     Vitamin B12; Future -     Folate; Future  Other specified hypothyroidism- her TSH is in the normal range, we'll continue the current dose of levothyroxine. -     TSH; Future  Hypokalemia- her potassium level is normal now at 4.3, will continue potassium replacement therapy. -     Magnesium; Future  Primary osteoarthritis of both knees -     Discontinue: HYDROcodone-acetaminophen (NORCO/VICODIN) 5-325 MG tablet; Take 1 tablet by mouth every 6 (six) hours as needed for moderate pain. -     Discontinue: HYDROcodone-acetaminophen (NORCO/VICODIN) 5-325 MG tablet; Take 1 tablet by mouth every 6 (six) hours as needed for moderate pain. -     Discontinue: HYDROcodone-acetaminophen (NORCO/VICODIN) 5-325 MG tablet; Take 1 tablet by mouth every 6 (six) hours as needed for moderate pain. -     Discontinue: HYDROcodone-acetaminophen (NORCO) 7.5-325 MG tablet; Take 1 tablet by mouth every 6 (six) hours as needed for moderate pain. -     Discontinue: HYDROcodone-acetaminophen (NORCO) 7.5-325 MG tablet; Take 1 tablet by mouth every 6 (six) hours as needed for moderate pain. -     HYDROcodone-acetaminophen (NORCO) 7.5-325 MG tablet; Take 1 tablet by mouth every 6 (six) hours as needed for moderate pain.  Left lumbar radiculitis -     Discontinue: HYDROcodone-acetaminophen (NORCO/VICODIN) 5-325 MG tablet; Take 1 tablet by mouth every 6 (six) hours as needed for moderate pain. -     Discontinue: HYDROcodone-acetaminophen (NORCO/VICODIN) 5-325 MG tablet; Take 1 tablet by mouth every 6 (six) hours  as needed for moderate pain. -     Discontinue: HYDROcodone-acetaminophen (NORCO/VICODIN) 5-325 MG tablet; Take 1 tablet by mouth every 6 (six) hours as needed for moderate pain. -     Discontinue: HYDROcodone-acetaminophen (NORCO) 7.5-325 MG tablet; Take 1 tablet by mouth every 6 (six) hours as needed for moderate pain. -     Discontinue: HYDROcodone-acetaminophen (NORCO) 7.5-325 MG tablet; Take 1 tablet by mouth every 6 (six) hours as needed for moderate pain. -     HYDROcodone-acetaminophen (NORCO) 7.5-325 MG tablet; Take 1 tablet by mouth every 6 (six) hours as needed for moderate pain.  Pap smear for cervical cancer screening -     Cytology - PAP   I have discontinued Ms. Rosenberry's Fluticasone-Salmeterol, Levothyroxine Sodium, busPIRone, naproxen, nortriptyline, oxyCODONE-acetaminophen, phentermine, and amLODipine. I am also having her maintain her ferrous sulfate, losartan, ziprasidone, buPROPion, SUMAtriptan, PROAIR HFA, clonazePAM, topiramate, potassium chloride SA, famotidine, metoprolol succinate, pantoprazole, atorvastatin, furosemide, and HYDROcodone-acetaminophen.  Meds ordered this encounter  Medications  . DISCONTD: HYDROcodone-acetaminophen (NORCO/VICODIN) 5-325 MG tablet    Sig: Take 1 tablet by mouth every 6 (six) hours as needed for moderate pain.    Dispense:  65 tablet    Refill:  0  . DISCONTD: HYDROcodone-acetaminophen (NORCO/VICODIN) 5-325 MG tablet    Sig: Take 1 tablet by mouth every 6 (six) hours as needed for moderate pain.    Dispense:  65 tablet    Refill:  0  . DISCONTD: HYDROcodone-acetaminophen (NORCO/VICODIN) 5-325 MG tablet    Sig: Take 1 tablet by mouth every 6 (six) hours as needed for moderate pain.    Dispense:  65 tablet    Refill:  0  . DISCONTD: HYDROcodone-acetaminophen (NORCO) 7.5-325 MG tablet    Sig: Take 1 tablet by mouth  every 6 (six) hours as needed for moderate pain.    Dispense:  70 tablet    Refill:  0  . DISCONTD:  HYDROcodone-acetaminophen (NORCO) 7.5-325 MG tablet    Sig: Take 1 tablet by mouth every 6 (six) hours as needed for moderate pain.    Dispense:  70 tablet    Refill:  0  . HYDROcodone-acetaminophen (NORCO) 7.5-325 MG tablet    Sig: Take 1 tablet by mouth every 6 (six) hours as needed for moderate pain.    Dispense:  70 tablet    Refill:  0   See AVS for instructions about healthy living and anticipatory guidance.  Follow-up: Return in about 4 months (around 11/21/2016).  Sanda Linger, MD

## 2016-07-24 NOTE — Progress Notes (Signed)
Pre visit review using our clinic review tool, if applicable. No additional management support is needed unless otherwise documented below in the visit note. 

## 2016-07-25 ENCOUNTER — Telehealth: Payer: Self-pay | Admitting: Internal Medicine

## 2016-07-25 ENCOUNTER — Encounter: Payer: Self-pay | Admitting: Internal Medicine

## 2016-07-25 LAB — CYTOLOGY - PAP
Diagnosis: NEGATIVE
HPV: NOT DETECTED

## 2016-07-25 NOTE — Telephone Encounter (Signed)
I will call pt when I am done.

## 2016-07-25 NOTE — Telephone Encounter (Signed)
States patient dropped off form to be completed for her office on 1/15.  Wants to follow up in regard.

## 2016-07-27 DIAGNOSIS — Z0000 Encounter for general adult medical examination without abnormal findings: Secondary | ICD-10-CM | POA: Insufficient documentation

## 2016-07-27 NOTE — Assessment & Plan Note (Signed)

## 2016-07-28 NOTE — Telephone Encounter (Signed)
I don't have this on my desk.

## 2016-07-28 NOTE — Telephone Encounter (Signed)
Will be faxing form back

## 2016-07-31 DIAGNOSIS — I10 Essential (primary) hypertension: Secondary | ICD-10-CM | POA: Diagnosis not present

## 2016-07-31 DIAGNOSIS — E785 Hyperlipidemia, unspecified: Secondary | ICD-10-CM | POA: Diagnosis not present

## 2016-08-01 ENCOUNTER — Telehealth: Payer: Self-pay | Admitting: Internal Medicine

## 2016-08-01 NOTE — Telephone Encounter (Signed)
Requesting recent labs to be sent over.  Fax is [phone]

## 2016-08-04 NOTE — Telephone Encounter (Signed)
Labs have been faxed.

## 2016-08-04 NOTE — Telephone Encounter (Signed)
Called patient left message advising of steffanies answer

## 2016-08-07 NOTE — Telephone Encounter (Signed)
Pt called in stated that she wanted to talk to Dr Yetta Barrejones nurse.  She was not clear on what she was needing.  About some form that she brought in in regards to her PAP .  She is upset

## 2016-08-16 DIAGNOSIS — E785 Hyperlipidemia, unspecified: Secondary | ICD-10-CM | POA: Diagnosis not present

## 2016-08-16 DIAGNOSIS — I10 Essential (primary) hypertension: Secondary | ICD-10-CM | POA: Diagnosis not present

## 2016-09-01 DIAGNOSIS — Z9884 Bariatric surgery status: Secondary | ICD-10-CM | POA: Diagnosis not present

## 2016-09-01 DIAGNOSIS — Z713 Dietary counseling and surveillance: Secondary | ICD-10-CM | POA: Diagnosis not present

## 2016-09-20 DIAGNOSIS — I10 Essential (primary) hypertension: Secondary | ICD-10-CM | POA: Diagnosis not present

## 2016-09-20 DIAGNOSIS — E785 Hyperlipidemia, unspecified: Secondary | ICD-10-CM | POA: Diagnosis not present

## 2016-09-20 DIAGNOSIS — Z7189 Other specified counseling: Secondary | ICD-10-CM | POA: Diagnosis not present

## 2016-09-25 ENCOUNTER — Ambulatory Visit (INDEPENDENT_AMBULATORY_CARE_PROVIDER_SITE_OTHER): Payer: Medicare Other | Admitting: Internal Medicine

## 2016-09-25 ENCOUNTER — Encounter: Payer: Self-pay | Admitting: Internal Medicine

## 2016-09-25 ENCOUNTER — Other Ambulatory Visit: Payer: Self-pay | Admitting: Internal Medicine

## 2016-09-25 VITALS — BP 110/70 | HR 73 | Temp 98.2°F | Ht 63.0 in | Wt 245.8 lb

## 2016-09-25 DIAGNOSIS — I10 Essential (primary) hypertension: Secondary | ICD-10-CM | POA: Diagnosis not present

## 2016-09-25 DIAGNOSIS — M170 Bilateral primary osteoarthritis of knee: Secondary | ICD-10-CM

## 2016-09-25 DIAGNOSIS — M5416 Radiculopathy, lumbar region: Secondary | ICD-10-CM | POA: Diagnosis not present

## 2016-09-25 MED ORDER — HYDROCODONE-ACETAMINOPHEN 7.5-325 MG PO TABS: 1.0000 | ORAL_TABLET | Freq: Four times a day (QID) | ORAL | 0 refills | Status: DC | PRN

## 2016-09-25 NOTE — Progress Notes (Signed)
Pre visit review using our clinic review tool, if applicable. No additional management support is needed unless otherwise documented below in the visit note. 

## 2016-09-25 NOTE — Progress Notes (Signed)
Subjective:  Patient ID: Danielle Holt [place], female    DOB: 08/09/1955  Age: 61 y.o. MRN: [id number]  CC: Hypertension and Osteoarthritis   HPI Danielle Holt Braithwaite presents for A blood pressure check and follow-up on chronic pain. She tells me her blood pressure has been well controlled. She offers no new or different complaints today. She tells me that she is going to undergo bariatric surgery in [facility] within the next week.  Outpatient Medications Prior to Visit  Medication Sig Dispense Refill  . atorvastatin (LIPITOR) 20 MG tablet TAKE 1 TABLET BY MOUTH EVERY NIGHT AT BEDTIME 90 tablet 3  . buPROPion (WELLBUTRIN XL) 150 MG 24 hr tablet TK 2 TS PO QD  1  . clonazePAM (KLONOPIN) 1 MG tablet Take by mouth.    . famotidine (PEPCID) 40 MG tablet TAKE 1 TABLET(40 MG) BY MOUTH AT BEDTIME 90 tablet 2  . ferrous sulfate 325 (65 FE) MG tablet Take 1 tablet (325 mg total) by mouth daily with breakfast. 90 tablet 3  . furosemide (LASIX) 20 MG tablet TAKE 1 TABLET BY MOUTH EVERY DAY AS NEEDED FOR SWELLING OF LEGS 90 tablet 1  . losartan (COZAAR) 50 MG tablet Take 1 tablet (50 mg total) by mouth daily. 90 tablet 3  . metoprolol succinate (TOPROL-XL) 50 MG 24 hr tablet TAKE 1 TABLET BY MOUTH DAILY WITH OR IMMEDIATELY FOLLOWING A MEAL 90 tablet 3  . pantoprazole (PROTONIX) 40 MG tablet TAKE 1 TABLET(40 MG) BY MOUTH DAILY 90 tablet 3  . potassium chloride SA (KLOR-CON M20) 20 MEQ tablet Take 1 tablet (20 mEq total) by mouth 2 (two) times daily. 180 tablet 1  . PROAIR HFA 108 (90 Base) MCG/ACT inhaler USE 1 PUFF BY MOUTH FOUR TIMES DAILY AS NEEDED 8.5 g 0  . topiramate (TOPAMAX) 100 MG tablet TK 1 T PO Holt  0  . ziprasidone (GEODON) 40 MG capsule TAKE 1 CAPSULE BY MOUTH AT BEDTIME 90 capsule 3  . HYDROcodone-acetaminophen (NORCO) 7.5-325 MG tablet Take 1 tablet by mouth every 6 (six) hours as needed for moderate pain. 70 tablet 0  . SUMAtriptan (IMITREX) 100 MG tablet Take 1 tablet (100 mg total) by  mouth every 2 (two) hours as needed for migraine. May repeat in 2 hours if headache persists or recurs. (Patient not taking: Reported on 09/25/2016) 10 tablet 3   No facility-administered medications prior to visit.     ROS Review of Systems  Constitutional: Negative for activity change, chills, diaphoresis, fatigue and unexpected weight change.  HENT: Negative.   Eyes: Negative.  Negative for visual disturbance.  Respiratory: Negative for cough, chest tightness, shortness of breath and wheezing.   Cardiovascular: Negative for chest pain, palpitations and leg swelling.  Gastrointestinal: Negative for abdominal pain, constipation, diarrhea, nausea and vomiting.  Endocrine: Negative.   Genitourinary: Negative.   Musculoskeletal: Positive for arthralgias and back pain. Negative for joint swelling, myalgias and neck pain.  Allergic/Immunologic: Negative.   Neurological: Negative.  Negative for dizziness, speech difficulty, weakness, light-headedness and headaches.  Hematological: Negative for adenopathy. Does not bruise/bleed easily.  Psychiatric/Behavioral: Negative.     Objective:  BP 110/70 (BP Location: Left Arm, Patient Position: Sitting, Cuff Size: Large)   Pulse 73   Temp 98.2 F (36.8 C) (Oral)   Ht 5\' 3"  (1.6 m)   Wt 245 lb 12 oz (111.5 kg)   SpO2 96%   BMI 43.53 kg/m   BP Readings from Last 3 Encounters:  09/25/16  110/70  07/24/16 128/80  03/08/16 110/70    Wt Readings from Last 3 Encounters:  09/25/16 245 lb 12 oz (111.5 kg)  07/24/16 256 lb (116.1 kg)  03/08/16 243 lb 8 oz (110.5 kg)    Physical Exam  Constitutional: She is oriented to person, place, and time. No distress.  HENT:  Mouth/Throat: Oropharynx is clear and moist. No oropharyngeal exudate.  Eyes: Conjunctivae are normal. Right eye exhibits no discharge. Left eye exhibits no discharge. No scleral icterus.  Neck: Normal range of motion. Neck supple. No JVD present. No tracheal deviation present. No  thyromegaly present.  Cardiovascular: Normal rate, regular rhythm, normal heart sounds and intact distal pulses.  Exam reveals no gallop and no friction rub.   No murmur heard. Pulmonary/Chest: Effort normal and breath sounds normal. No stridor. No respiratory distress. She has no wheezes. She has no rales. She exhibits no tenderness.  Abdominal: Soft. Bowel sounds are normal. She exhibits no distension and no mass. There is no tenderness. There is no rebound and no guarding.  Musculoskeletal: Normal range of motion. She exhibits no edema, tenderness or deformity.  Lymphadenopathy:    She has no cervical adenopathy.  Neurological: She is oriented to person, place, and time.  Skin: Skin is warm and dry. No rash noted. She is not diaphoretic. No erythema. No pallor.  Psychiatric: She has a normal mood and affect. Her behavior is normal. Judgment and thought content normal.  Vitals reviewed.   Lab Results  Component Value Date   WBC 8.1 07/24/2016   HGB 12.8 07/24/2016   HCT 38.7 07/24/2016   PLT 293.0 07/24/2016   GLUCOSE 113 (H) 07/24/2016   CHOL 155 03/08/2016   TRIG 169.0 (H) 03/08/2016   HDL 53.30 03/08/2016   LDLDIRECT 105.9 10/29/2012   LDLCALC 68 03/08/2016   ALT 13 07/24/2016   AST 14 07/24/2016   NA 137 07/24/2016   K 4.3 07/24/2016   CL 103 07/24/2016   CREATININE 1.10 07/24/2016   BUN 21 07/24/2016   CO2 27 07/24/2016   TSH 2.84 07/24/2016   HGBA1C 6.0 03/08/2016    No results found.  Assessment & Plan:   Arabella was seen today for hypertension and osteoarthritis.  Diagnoses and all orders for this visit:  Essential hypertension, benign- her blood pressure is adequately well controlled  Primary osteoarthritis of both knees -     Discontinue: HYDROcodone-acetaminophen (NORCO) 7.5-325 MG tablet; Take 1 tablet by mouth every 6 (six) hours as needed for moderate pain. -     Discontinue: HYDROcodone-acetaminophen (NORCO) 7.5-325 MG tablet; Take 1 tablet by  mouth every 6 (six) hours as needed for moderate pain. -     HYDROcodone-acetaminophen (NORCO) 7.5-325 MG tablet; Take 1 tablet by mouth every 6 (six) hours as needed for moderate pain.  Left lumbar radiculitis -     Discontinue: HYDROcodone-acetaminophen (NORCO) 7.5-325 MG tablet; Take 1 tablet by mouth every 6 (six) hours as needed for moderate pain. -     Discontinue: HYDROcodone-acetaminophen (NORCO) 7.5-325 MG tablet; Take 1 tablet by mouth every 6 (six) hours as needed for moderate pain. -     HYDROcodone-acetaminophen (NORCO) 7.5-325 MG tablet; Take 1 tablet by mouth every 6 (six) hours as needed for moderate pain.   I have discontinued Ms. Rahal's SUMAtriptan. I am also having her maintain her ferrous sulfate, losartan, ziprasidone, buPROPion, PROAIR HFA, clonazePAM, topiramate, potassium chloride SA, famotidine, metoprolol succinate, pantoprazole, atorvastatin, furosemide, and HYDROcodone-acetaminophen.  Meds ordered this encounter  Medications  . DISCONTD: HYDROcodone-acetaminophen (NORCO) 7.5-325 MG tablet    Sig: Take 1 tablet by mouth every 6 (six) hours as needed for moderate pain.    Dispense:  70 tablet    Refill:  0  . DISCONTD: HYDROcodone-acetaminophen (NORCO) 7.5-325 MG tablet    Sig: Take 1 tablet by mouth every 6 (six) hours as needed for moderate pain.    Dispense:  70 tablet    Refill:  0  . HYDROcodone-acetaminophen (NORCO) 7.5-325 MG tablet    Sig: Take 1 tablet by mouth every 6 (six) hours as needed for moderate pain.    Dispense:  70 tablet    Refill:  0     Follow-up: No Follow-up on file.  Sanda Linger, MD

## 2016-09-25 NOTE — Patient Instructions (Signed)

## 2016-09-27 DIAGNOSIS — Z01818 Encounter for other preprocedural examination: Secondary | ICD-10-CM | POA: Diagnosis not present

## 2016-09-28 DIAGNOSIS — I10 Essential (primary) hypertension: Secondary | ICD-10-CM | POA: Diagnosis not present

## 2016-09-28 DIAGNOSIS — E785 Hyperlipidemia, unspecified: Secondary | ICD-10-CM | POA: Diagnosis not present

## 2016-09-28 DIAGNOSIS — D649 Anemia, unspecified: Secondary | ICD-10-CM | POA: Diagnosis not present

## 2016-09-28 DIAGNOSIS — N736 Female pelvic peritoneal adhesions (postinfective): Secondary | ICD-10-CM | POA: Diagnosis not present

## 2016-09-28 DIAGNOSIS — K219 Gastro-esophageal reflux disease without esophagitis: Secondary | ICD-10-CM | POA: Diagnosis not present

## 2016-09-28 DIAGNOSIS — J449 Chronic obstructive pulmonary disease, unspecified: Secondary | ICD-10-CM | POA: Diagnosis not present

## 2016-09-28 DIAGNOSIS — Z87891 Personal history of nicotine dependence: Secondary | ICD-10-CM | POA: Diagnosis not present

## 2016-09-28 DIAGNOSIS — G4733 Obstructive sleep apnea (adult) (pediatric): Secondary | ICD-10-CM | POA: Diagnosis not present

## 2016-09-28 DIAGNOSIS — Z9049 Acquired absence of other specified parts of digestive tract: Secondary | ICD-10-CM | POA: Diagnosis not present

## 2016-10-16 ENCOUNTER — Telehealth: Payer: Self-pay | Admitting: Internal Medicine

## 2016-10-16 NOTE — Telephone Encounter (Signed)
Pharmacy called stating she had her Hydro filled at [facility] yesterday but lost the bottle, now she is trying to her get her Hydro filled at [facility]. We cannot confirm if she actually lost the Rx or not. [facility] needs  The ok to fill it.  Misty Stanley  [phone]

## 2016-11-14 ENCOUNTER — Ambulatory Visit (INDEPENDENT_AMBULATORY_CARE_PROVIDER_SITE_OTHER): Payer: Medicare Other | Admitting: Internal Medicine

## 2016-11-14 ENCOUNTER — Encounter: Payer: Self-pay | Admitting: Internal Medicine

## 2016-11-14 VITALS — BP 124/78 | HR 78 | Temp 98.2°F | Resp 16 | Ht 63.0 in | Wt 229.2 lb

## 2016-11-14 DIAGNOSIS — M5416 Radiculopathy, lumbar region: Secondary | ICD-10-CM

## 2016-11-14 DIAGNOSIS — Z9884 Bariatric surgery status: Secondary | ICD-10-CM | POA: Diagnosis not present

## 2016-11-14 DIAGNOSIS — M170 Bilateral primary osteoarthritis of knee: Secondary | ICD-10-CM | POA: Diagnosis not present

## 2016-11-14 MED ORDER — HYDROCODONE-ACETAMINOPHEN 7.5-325 MG PO TABS: 1.0000 | ORAL_TABLET | Freq: Four times a day (QID) | ORAL | 0 refills | Status: DC | PRN

## 2016-11-14 MED ORDER — ONDANSETRON 4 MG PO TBDP: 4.0000 mg | ORAL_TABLET | Freq: Two times a day (BID) | ORAL | 2 refills | Status: DC

## 2016-11-14 NOTE — Progress Notes (Signed)
Pre visit review using our clinic review tool, if applicable. No additional management support is needed unless otherwise documented below in the visit note. 

## 2016-11-14 NOTE — Patient Instructions (Signed)
Osteoarthritis  Osteoarthritis is a type of arthritis that affects tissue that covers the ends of bones in joints (cartilage). Cartilage acts as a cushion between the bones and helps them move smoothly. Osteoarthritis results when cartilage in the joints gets worn down. Osteoarthritis is sometimes called "wear and tear" arthritis.  Osteoarthritis is the most common form of arthritis. It often occurs in older people. It is a condition that gets worse over time (a progressive condition). Joints that are most often affected by this condition are in:  · Fingers.  · Toes.  · Hips.  · Knees.  · Spine, including neck and lower back.    What are the causes?  This condition is caused by age-related wearing down of cartilage that covers the ends of bones.  What increases the risk?  The following factors may make you more likely to develop this condition:  · Older age.  · Being overweight or obese.  · Overuse of joints, such as in athletes.  · Past injury of a joint.  · Past surgery on a joint.  · Family history of osteoarthritis.    What are the signs or symptoms?  The main symptoms of this condition are pain, swelling, and stiffness in the joint. The joint may lose its shape over time. Small pieces of bone or cartilage may break off and float inside of the joint, which may cause more pain and damage to the joint. Small deposits of bone (osteophytes) may grow on the edges of the joint. Other symptoms may include:  · A grating or scraping feeling inside the joint when you move it.  · Popping or creaking sounds when you move.    Symptoms may affect one or more joints. Osteoarthritis in a major joint, such as your knee or hip, can make it painful to walk or exercise. If you have osteoarthritis in your hands, you might not be able to grip items, twist your hand, or control small movements of your hands and fingers (fine motor skills).  How is this diagnosed?  This condition may be diagnosed based on:  · Your medical history.  · A  physical exam.  · Your symptoms.  · X-rays of the affected joint(s).  · Blood tests to rule out other types of arthritis.    How is this treated?  There is no cure for this condition, but treatment can help to control pain and improve joint function. Treatment plans may include:  · A prescribed exercise program that allows for rest and joint relief. You may work with a physical therapist.  · A weight control plan.  · Pain relief techniques, such as:  ? Applying heat and cold to the joint.  ? Electric pulses delivered to nerve endings under the skin (transcutaneous electrical nerve stimulation, or TENS).  ? Massage.  ? Certain nutritional supplements.  · NSAIDs or prescription medicines to help relieve pain.  · Medicine to help relieve pain and inflammation (corticosteroids). This can be given by mouth (orally) or as an injection.  · Assistive devices, such as a brace, wrap, splint, specialized glove, or cane.  · Surgery, such as:  ? An osteotomy. This is done to reposition the bones and relieve pain or to remove loose pieces of bone and cartilage.  ? Joint replacement surgery. You may need this surgery if you have very bad (advanced) osteoarthritis.    Follow these instructions at home:  Activity   · Rest your affected joints as directed by your   health care provider.  · Do not drive or use heavy machinery while taking prescription pain medicine.  · Exercise as directed. Your health care provider or physical therapist may recommend specific types of exercise, such as:  ? Strengthening exercises. These are done to strengthen the muscles that support joints that are affected by arthritis. They can be performed with weights or with exercise bands to add resistance.  ? Aerobic activities. These are exercises, such as brisk walking or water aerobics, that get your heart pumping.  ? Range-of-motion activities. These keep your joints easy to move.  ? Balance and agility exercises.  Managing pain, stiffness, and swelling    · If directed, apply heat to the affected area as often as told by your health care provider. Use the heat source that your health care provider recommends, such as a moist heat pack or a heating pad.  ? If you have a removable assistive device, remove it as told by your health care provider.  ? Place a towel between your skin and the heat source. If your health care provider tells you to keep the assistive device on while you apply heat, place a towel between the assistive device and the heat source.  ? Leave the heat on for 20-30 minutes.  ? Remove the heat if your skin turns bright red. This is especially important if you are unable to feel pain, heat, or cold. You may have a greater risk of getting burned.  · If directed, put ice on the affected joint:  ? If you have a removable assistive device, remove it as told by your health care provider.  ? Put ice in a plastic bag.  ? Place a towel between your skin and the bag. If your health care provider tells you to keep the assistive device on during icing, place a towel between the assistive device and the bag.  ? Leave the ice on for 20 minutes, 2-3 times a day.  General instructions   · Take over-the-counter and prescription medicines only as told by your health care provider.  · Maintain a healthy weight. Follow instructions from your health care provider for weight control. These may include dietary restrictions.  · Do not use any products that contain nicotine or tobacco, such as cigarettes and e-cigarettes. These can delay bone healing. If you need help quitting, ask your health care provider.  · Use assistive devices as directed by your health care provider.  · Keep all follow-up visits as told by your health care provider. This is important.  Where to find more information:  · National Institute of Arthritis and Musculoskeletal and Skin Diseases: www.niams.nih.gov  · National Institute on Aging: www.nia.nih.gov  · American College of Rheumatology:  www.rheumatology.org  Contact a health care provider if:  · Your skin turns red.  · You develop a rash.  · You have pain that gets worse.  · You have a fever along with joint or muscle aches.  Get help right away if:  · You lose a lot of weight.  · You suddenly lose your appetite.  · You have night sweats.  Summary  · Osteoarthritis is a type of arthritis that affects tissue covering the ends of bones in joints (cartilage).  · This condition is caused by age-related wearing down of cartilage that covers the ends of bones.  · The main symptom of this condition is pain, swelling, and stiffness in the joint.  · There is no cure for this   condition, but treatment can help to control pain and improve joint function.  This information is not intended to replace advice given to you by your health care provider. Make sure you discuss any questions you have with your health care provider.  Document Released: 06/26/2005 Document Revised: 02/28/2016 Document Reviewed: 02/28/2016  Elsevier Interactive Patient Education © 2017 [employer]

## 2016-11-14 NOTE — Progress Notes (Signed)
Subjective:  Patient ID: Danielle Holt, female    DOB: Jun 19, 1956  Age: 61 y.o. 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 She also complains of chronic pain and wants a refill on hydrocortisone. She is appropriately losing weight but denies vomiting, abdominal pain, loss of appetite, diarrhea, constipation.  Outpatient Medications Prior to Visit  Medication Sig Dispense Refill  . atorvastatin (LIPITOR) 20 MG tablet TAKE 1 TABLET BY MOUTH EVERY NIGHT AT BEDTIME 90 tablet 3  . buPROPion (WELLBUTRIN XL) 150 MG 24 hr tablet TK 2 TS PO QD  1  . clonazePAM (KLONOPIN) 1 MG tablet Take by mouth.    . famotidine (PEPCID) 40 MG tablet TAKE 1 TABLET(40 MG) BY MOUTH AT BEDTIME 90 tablet 3  . ferrous sulfate 325 (65 FE) MG tablet Take 1 tablet (325 mg total) by mouth daily with breakfast. 90 tablet 3  . furosemide (LASIX) 20 MG tablet TAKE 1 TABLET BY MOUTH EVERY DAY AS NEEDED FOR SWELLING OF LEGS 90 tablet 1  . losartan (COZAAR) 50 MG tablet Take 1 tablet (50 mg total) by mouth daily. 90 tablet 3  . metoprolol succinate (TOPROL-XL) 50 MG 24 hr tablet TAKE 1 TABLET BY MOUTH DAILY WITH OR IMMEDIATELY FOLLOWING A MEAL 90 tablet 3  . pantoprazole (PROTONIX) 40 MG tablet TAKE 1 TABLET(40 MG) BY MOUTH DAILY 90 tablet 3  . potassium chloride SA (KLOR-CON M20) 20 MEQ tablet Take 1 tablet (20 mEq total) by mouth 2 (two) times daily. 180 tablet 1  . PROAIR HFA 108 (90 Base) MCG/ACT inhaler USE 1 PUFF BY MOUTH FOUR TIMES DAILY AS NEEDED 8.5 g 0  . topiramate (TOPAMAX) 100 MG tablet TK 1 T PO D  0  . ziprasidone (GEODON) 40 MG capsule TAKE 1 CAPSULE BY MOUTH AT BEDTIME 90 capsule 3  . HYDROcodone-acetaminophen (NORCO) 7.5-325 MG tablet Take 1 tablet by mouth every 6 (six) hours as needed for moderate pain. 70 tablet 0   No  facility-administered medications prior to visit.     ROS Review of Systems  Constitutional: Negative.  Negative for activity change, chills, diaphoresis, fatigue and unexpected weight change.  HENT: Negative.  Negative for trouble swallowing and voice change.   Eyes: Negative for visual disturbance.  Respiratory: Negative for cough, chest tightness, shortness of breath and wheezing.   Cardiovascular: Negative for chest pain, palpitations and leg swelling.  Gastrointestinal: Positive for nausea. Negative for abdominal pain, anal bleeding, blood in stool, constipation, diarrhea, rectal pain and vomiting.  Genitourinary: Negative.  Negative for decreased urine volume, difficulty urinating, dysuria, frequency and urgency.  Musculoskeletal: Positive for arthralgias and back pain. Negative for myalgias and neck pain.  Skin: Negative.   Allergic/Immunologic: Negative.   Neurological: Negative.  Negative for dizziness, weakness and numbness.  Hematological: Negative.  Negative for adenopathy. Does not bruise/bleed easily.  Psychiatric/Behavioral: Negative.     Objective:  BP 124/78 (BP Location: Left Arm, Patient Position: Sitting, Cuff Size: Large)   Pulse 78   Temp 98.2 F (36.8 C) (Oral)   Ht 5\' 3"  (1.6 m)   Wt 229 lb 4 oz (104 kg)   SpO2 98%   BMI 40.61 kg/m   BP Readings from Last 3 Encounters:  11/14/16 124/78  09/25/16 110/70  07/24/16 128/80  Wt Readings from Last 3 Encounters:  11/14/16 229 lb 4 oz (104 kg)  09/25/16 245 lb 12 oz (111.5 kg)  07/24/16 256 lb (116.1 kg)    Physical Exam  Constitutional: She is oriented to person, place, and time. No distress.  HENT:  Mouth/Throat: Oropharynx is clear and moist. No oropharyngeal exudate.  Eyes: Conjunctivae are normal. Right eye exhibits no discharge. Left eye exhibits no discharge. No scleral icterus.  Neck: Normal range of motion. Neck supple. No JVD present. No tracheal deviation present. No thyromegaly present.    Cardiovascular: Normal rate, regular rhythm, normal heart sounds and intact distal pulses.  Exam reveals no gallop and no friction rub.   No murmur heard. Pulmonary/Chest: Effort normal and breath sounds normal. No stridor. No respiratory distress. She has no wheezes. She has no rales. She exhibits no tenderness.  Abdominal: Soft. Bowel sounds are normal. She exhibits no distension and no mass. There is no tenderness. There is no rebound and no guarding.  Musculoskeletal: Normal range of motion. She exhibits no edema, tenderness or deformity.  Lymphadenopathy:    She has no cervical adenopathy.  Neurological: She is oriented to person, place, and time.  Skin: Skin is warm and dry. No rash noted. She is not diaphoretic. No erythema. No pallor.  Psychiatric: She has a normal mood and affect. Her behavior is normal. Judgment and thought content normal.  Vitals reviewed.   Lab Results  Component Value Date   WBC 8.1 07/24/2016   HGB 12.8 07/24/2016   HCT 38.7 07/24/2016   PLT 293.0 07/24/2016   GLUCOSE 113 (H) 07/24/2016   CHOL 155 03/08/2016   TRIG 169.0 (H) 03/08/2016   HDL 53.30 03/08/2016   LDLDIRECT 105.9 10/29/2012   LDLCALC 68 03/08/2016   ALT 13 07/24/2016   AST 14 07/24/2016   NA 137 07/24/2016   K 4.3 07/24/2016   CL 103 07/24/2016   CREATININE 1.10 07/24/2016   BUN 21 07/24/2016   CO2 27 07/24/2016   TSH 2.84 07/24/2016   HGBA1C 6.0 03/08/2016    No results found.  Assessment & Plan:   Danielle Holt was seen today for osteoarthritis and back pain.  Diagnoses and all orders for this visit:  Gastric bypass status for obesity -     ondansetron (ZOFRAN-ODT) 4 MG disintegrating tablet; Take 1 tablet (4 mg total) by mouth 2 (two) times daily.  Primary osteoarthritis of both knees -     Discontinue: HYDROcodone-acetaminophen (NORCO) 7.5-325 MG tablet; Take 1 tablet by mouth every 6 (six) hours as needed for moderate pain. -     Discontinue: HYDROcodone-acetaminophen  (NORCO) 7.5-325 MG tablet; Take 1 tablet by mouth every 6 (six) hours as needed for moderate pain. -     HYDROcodone-acetaminophen (NORCO) 7.5-325 MG tablet; Take 1 tablet by mouth every 6 (six) hours as needed for moderate pain. -     Ambulatory referral to Sports Medicine  Left lumbar radiculitis -     Discontinue: HYDROcodone-acetaminophen (NORCO) 7.5-325 MG tablet; Take 1 tablet by mouth every 6 (six) hours as needed for moderate pain. -     Discontinue: HYDROcodone-acetaminophen (NORCO) 7.5-325 MG tablet; Take 1 tablet by mouth every 6 (six) hours as needed for moderate pain. -     HYDROcodone-acetaminophen (NORCO) 7.5-325 MG tablet; Take 1 tablet by mouth every 6 (six) hours as needed for moderate pain.   I am having Ms. Novakovich start on ondansetron. I am also having her maintain her ferrous sulfate, losartan,  ziprasidone, buPROPion, PROAIR HFA, clonazePAM, topiramate, potassium chloride SA, metoprolol succinate, pantoprazole, atorvastatin, furosemide, famotidine, and HYDROcodone-acetaminophen.  Meds ordered this encounter  Medications  . DISCONTD: HYDROcodone-acetaminophen (NORCO) 7.5-325 MG tablet    Sig: Take 1 tablet by mouth every 6 (six) hours as needed for moderate pain.    Dispense:  70 tablet    Refill:  0  . DISCONTD: HYDROcodone-acetaminophen (NORCO) 7.5-325 MG tablet    Sig: Take 1 tablet by mouth every 6 (six) hours as needed for moderate pain.    Dispense:  70 tablet    Refill:  0  . HYDROcodone-acetaminophen (NORCO) 7.5-325 MG tablet    Sig: Take 1 tablet by mouth every 6 (six) hours as needed for moderate pain.    Dispense:  70 tablet    Refill:  0  . ondansetron (ZOFRAN-ODT) 4 MG disintegrating tablet    Sig: Take 1 tablet (4 mg total) by mouth 2 (two) times daily.    Dispense:  60 tablet    Refill:  2     Follow-up: Return in about 3 months (around 02/14/2017).  Sanda Linger, MD

## 2016-11-19 DIAGNOSIS — M1990 Unspecified osteoarthritis, unspecified site: Secondary | ICD-10-CM | POA: Diagnosis not present

## 2016-11-21 ENCOUNTER — Ambulatory Visit: Payer: Medicare Other | Admitting: Internal Medicine

## 2016-12-20 ENCOUNTER — Ambulatory Visit (INDEPENDENT_AMBULATORY_CARE_PROVIDER_SITE_OTHER): Payer: Medicare Other | Admitting: Internal Medicine

## 2016-12-20 ENCOUNTER — Encounter: Payer: Self-pay | Admitting: Internal Medicine

## 2016-12-20 VITALS — BP 120/76 | HR 72 | Temp 98.1°F | Resp 16 | Ht 63.0 in | Wt 224.8 lb

## 2016-12-20 DIAGNOSIS — M170 Bilateral primary osteoarthritis of knee: Secondary | ICD-10-CM

## 2016-12-20 DIAGNOSIS — M5416 Radiculopathy, lumbar region: Secondary | ICD-10-CM | POA: Diagnosis not present

## 2016-12-20 DIAGNOSIS — Z79891 Long term (current) use of opiate analgesic: Secondary | ICD-10-CM | POA: Diagnosis not present

## 2016-12-20 MED ORDER — HYDROCODONE-ACETAMINOPHEN 7.5-325 MG PO TABS: 1.0000 | ORAL_TABLET | Freq: Three times a day (TID) | ORAL | 0 refills | Status: DC | PRN

## 2016-12-20 NOTE — Progress Notes (Signed)
Subjective:  Patient ID: Danielle Holt, female    DOB: 12/06/1955  Age: 61 y.o. MRN: [id number]  CC: Back Pain and Osteoarthritis   HPI Danielle LimeConstance D Cali presents for management of chronic pain. She requests a refill on Norco. She says it helps control her pain and allows her to do her activities of daily living. She takes Norco about 3 or 4 times a day and tells me that the previous prescription of 70 months caused her to run out of the medication earlier in the month. She states she doesn't think she could achieve these things without taking Norco. She offers no new or different complaints today.   Outpatient Medications Prior to Visit  Medication Sig Dispense Refill  . atorvastatin (LIPITOR) 20 MG tablet TAKE 1 TABLET BY MOUTH EVERY NIGHT AT BEDTIME 90 tablet 3  . buPROPion (WELLBUTRIN XL) 150 MG 24 hr tablet TK 2 TS PO QD  1  . clonazePAM (KLONOPIN) 1 MG tablet Take by mouth.    . famotidine (PEPCID) 40 MG tablet TAKE 1 TABLET(40 MG) BY MOUTH AT BEDTIME 90 tablet 3  . ferrous sulfate 325 (65 FE) MG tablet Take 1 tablet (325 mg total) by mouth daily with breakfast. 90 tablet 3  . furosemide (LASIX) 20 MG tablet TAKE 1 TABLET BY MOUTH EVERY DAY AS NEEDED FOR SWELLING OF LEGS 90 tablet 1  . losartan (COZAAR) 50 MG tablet Take 1 tablet (50 mg total) by mouth daily. 90 tablet 3  . metoprolol succinate (TOPROL-XL) 50 MG 24 hr tablet TAKE 1 TABLET BY MOUTH DAILY WITH OR IMMEDIATELY FOLLOWING A MEAL 90 tablet 3  . ondansetron (ZOFRAN-ODT) 4 MG disintegrating tablet Take 1 tablet (4 mg total) by mouth 2 (two) times daily. 60 tablet 2  . pantoprazole (PROTONIX) 40 MG tablet TAKE 1 TABLET(40 MG) BY MOUTH DAILY 90 tablet 3  . potassium chloride SA (KLOR-CON M20) 20 MEQ tablet Take 1 tablet (20 mEq total) by mouth 2 (two) times daily. 180 tablet 1  . PROAIR HFA 108 (90 Base) MCG/ACT inhaler USE 1 PUFF BY MOUTH FOUR TIMES DAILY AS NEEDED 8.5 g 0  . topiramate (TOPAMAX) 100 MG tablet TK 1 T PO D   0  . ziprasidone (GEODON) 40 MG capsule TAKE 1 CAPSULE BY MOUTH AT BEDTIME 90 capsule 3  . HYDROcodone-acetaminophen (NORCO) 7.5-325 MG tablet Take 1 tablet by mouth every 6 (six) hours as needed for moderate pain. 70 tablet 0   No facility-administered medications prior to visit.     ROS Review of Systems  Constitutional: Negative for appetite change, chills, diaphoresis and fatigue.  HENT: Negative.  Negative for trouble swallowing.   Eyes: Negative for visual disturbance.  Respiratory: Negative for cough, choking, chest tightness, shortness of breath, wheezing and stridor.   Cardiovascular: Negative for chest pain, palpitations and leg swelling.  Gastrointestinal: Negative for abdominal pain, constipation, diarrhea, nausea and vomiting.  Endocrine: Negative.   Genitourinary: Negative.  Negative for difficulty urinating and dysuria.  Musculoskeletal: Positive for arthralgias and back pain. Negative for joint swelling, myalgias and neck pain.  Allergic/Immunologic: Negative.   Neurological: Negative.   Hematological: Negative.  Negative for adenopathy. Does not bruise/bleed easily.  Psychiatric/Behavioral: Negative.  Negative for dysphoric mood and sleep disturbance. The patient is not nervous/anxious.     Objective:  BP 120/76 (BP Location: Left Arm, Patient Position: Sitting, Cuff Size: Normal)   Pulse 72   Temp 98.1 F (36.7 C) (Oral)   Resp  16   Ht 5\' 3"  (1.6 m)   Wt 224 lb 12 oz (101.9 kg)   SpO2 99%   BMI 39.81 kg/m   BP Readings from Last 3 Encounters:  12/20/16 120/76  11/14/16 124/78  09/25/16 110/70    Wt Readings from Last 3 Encounters:  12/20/16 224 lb 12 oz (101.9 kg)  11/14/16 229 lb 4 oz (104 kg)  09/25/16 245 lb 12 oz (111.5 kg)    Physical Exam  Constitutional: She is oriented to person, place, and time. No distress.  HENT:  Mouth/Throat: Oropharynx is clear and moist. No oropharyngeal exudate.  Eyes: Conjunctivae are normal. Right eye exhibits no  discharge. Left eye exhibits no discharge. No scleral icterus.  Neck: Normal range of motion. Neck supple. No JVD present. No thyromegaly present.  Cardiovascular: Normal rate and regular rhythm.  Exam reveals no gallop and no friction rub.   No murmur heard. Pulmonary/Chest: Effort normal and breath sounds normal. No respiratory distress. She has no wheezes. She has no rales. She exhibits no tenderness.  Abdominal: Soft. Bowel sounds are normal. She exhibits no distension and no mass. There is no tenderness. There is no rebound and no guarding.  Musculoskeletal: Normal range of motion. She exhibits deformity (DJD in both knees). She exhibits no edema or tenderness.  Lymphadenopathy:    She has no cervical adenopathy.  Neurological: She is alert and oriented to person, place, and time.  Skin: Skin is warm and dry. No rash noted. She is not diaphoretic. No erythema. No pallor.  Vitals reviewed.   Lab Results  Component Value Date   WBC 8.1 07/24/2016   HGB 12.8 07/24/2016   HCT 38.7 07/24/2016   PLT 293.0 07/24/2016   GLUCOSE 113 (H) 07/24/2016   CHOL 155 03/08/2016   TRIG 169.0 (H) 03/08/2016   HDL 53.30 03/08/2016   LDLDIRECT 105.9 10/29/2012   LDLCALC 68 03/08/2016   ALT 13 07/24/2016   AST 14 07/24/2016   NA 137 07/24/2016   K 4.3 07/24/2016   CL 103 07/24/2016   CREATININE 1.10 07/24/2016   BUN 21 07/24/2016   CO2 27 07/24/2016   TSH 2.84 07/24/2016   HGBA1C 6.0 03/08/2016    No results found.  Assessment & Plan:   Jachelle was seen today for back pain and osteoarthritis.  Diagnoses and all orders for this visit:  Left lumbar radiculitis- as below -     Discontinue: HYDROcodone-acetaminophen (NORCO) 7.5-325 MG tablet; Take 1 tablet by mouth 3 (three) times daily as needed for moderate pain. -     Discontinue: HYDROcodone-acetaminophen (NORCO) 7.5-325 MG tablet; Take 1 tablet by mouth 3 (three) times daily as needed for moderate pain. -      HYDROcodone-acetaminophen (NORCO) 7.5-325 MG tablet; Take 1 tablet by mouth 3 (three) times daily as needed for moderate pain.  Primary osteoarthritis of both knees- a UDS was collected today, I've asked her to take 3 doses of Norco per day with 90 per month. -     Discontinue: HYDROcodone-acetaminophen (NORCO) 7.5-325 MG tablet; Take 1 tablet by mouth 3 (three) times daily as needed for moderate pain. -     Discontinue: HYDROcodone-acetaminophen (NORCO) 7.5-325 MG tablet; Take 1 tablet by mouth 3 (three) times daily as needed for moderate pain. -     HYDROcodone-acetaminophen (NORCO) 7.5-325 MG tablet; Take 1 tablet by mouth 3 (three) times daily as needed for moderate pain.   I am having Ms. Fiorella maintain her ferrous sulfate, losartan,  ziprasidone, buPROPion, PROAIR HFA, clonazePAM, topiramate, potassium chloride SA, metoprolol succinate, pantoprazole, atorvastatin, furosemide, famotidine, ondansetron, and HYDROcodone-acetaminophen.  Meds ordered this encounter  Medications  . DISCONTD: HYDROcodone-acetaminophen (NORCO) 7.5-325 MG tablet    Sig: Take 1 tablet by mouth 3 (three) times daily as needed for moderate pain.    Dispense:  90 tablet    Refill:  0  . DISCONTD: HYDROcodone-acetaminophen (NORCO) 7.5-325 MG tablet    Sig: Take 1 tablet by mouth 3 (three) times daily as needed for moderate pain.    Dispense:  90 tablet    Refill:  0  . HYDROcodone-acetaminophen (NORCO) 7.5-325 MG tablet    Sig: Take 1 tablet by mouth 3 (three) times daily as needed for moderate pain.    Dispense:  90 tablet    Refill:  0     Follow-up: No Follow-up on file.  Sanda Linger, MD

## 2016-12-20 NOTE — Patient Instructions (Signed)

## 2016-12-21 ENCOUNTER — Ambulatory Visit: Payer: Medicare Other | Admitting: Family Medicine

## 2016-12-26 ENCOUNTER — Telehealth: Payer: Self-pay

## 2016-12-26 NOTE — Telephone Encounter (Signed)
LVM for pt to call back as soon as possible.   RE: toxicology report.  DO NOT GIVE THE RESULT IF YOU GET THE CALL

## 2016-12-26 NOTE — Telephone Encounter (Signed)
Pt called back and results given to the patient

## 2016-12-26 NOTE — Telephone Encounter (Signed)
Toxicology report came back positive for cocaine which was an unexpected result.

## 2016-12-26 NOTE — Telephone Encounter (Signed)
Dismissal letter printed and dismissal form completed.

## 2016-12-28 ENCOUNTER — Telehealth: Payer: Self-pay | Admitting: Internal Medicine

## 2016-12-28 NOTE — Telephone Encounter (Signed)
Patient dismissed from [facility] by Sanda Lingerhomas Jones MD , effective December 26, 2016. Dismissal letter sent out by certified / registered mail.  daj

## 2017-01-05 ENCOUNTER — Other Ambulatory Visit: Payer: Self-pay | Admitting: Internal Medicine

## 2017-01-05 NOTE — Telephone Encounter (Signed)
Received signed domestic return receipt verifying delivery of certified letter on December 29, 2016. Article number [id number] [phone] daj

## 2017-01-12 ENCOUNTER — Ambulatory Visit: Payer: Medicare Other | Admitting: Family Medicine

## 2017-02-14 ENCOUNTER — Ambulatory Visit: Payer: Medicare Other | Admitting: Internal Medicine

## 2017-04-02 DIAGNOSIS — M25562 Pain in left knee: Secondary | ICD-10-CM | POA: Diagnosis not present

## 2017-04-02 DIAGNOSIS — D539 Nutritional anemia, unspecified: Secondary | ICD-10-CM | POA: Diagnosis not present

## 2017-04-02 DIAGNOSIS — M25561 Pain in right knee: Secondary | ICD-10-CM | POA: Diagnosis not present

## 2017-04-02 DIAGNOSIS — I10 Essential (primary) hypertension: Secondary | ICD-10-CM | POA: Diagnosis not present

## 2017-04-02 DIAGNOSIS — R350 Frequency of micturition: Secondary | ICD-10-CM | POA: Diagnosis not present

## 2017-04-05 ENCOUNTER — Other Ambulatory Visit: Payer: Self-pay | Admitting: Internal Medicine

## 2017-07-07 ENCOUNTER — Other Ambulatory Visit: Payer: Self-pay | Admitting: Internal Medicine

## 2017-07-07 DIAGNOSIS — I10 Essential (primary) hypertension: Secondary | ICD-10-CM

## 2017-09-28 ENCOUNTER — Other Ambulatory Visit: Payer: Self-pay | Admitting: Internal Medicine

## 2018-02-08 ENCOUNTER — Encounter: Payer: Self-pay | Admitting: Neurology

## 2018-02-08 ENCOUNTER — Ambulatory Visit (INDEPENDENT_AMBULATORY_CARE_PROVIDER_SITE_OTHER): Payer: Medicare Other | Admitting: Neurology

## 2018-02-08 VITALS — BP 103/67 | HR 60 | Ht 62.0 in | Wt 230.0 lb

## 2018-02-08 DIAGNOSIS — G4733 Obstructive sleep apnea (adult) (pediatric): Secondary | ICD-10-CM

## 2018-02-08 DIAGNOSIS — R059 Cough, unspecified: Secondary | ICD-10-CM

## 2018-02-08 DIAGNOSIS — R0981 Nasal congestion: Secondary | ICD-10-CM

## 2018-02-08 DIAGNOSIS — R05 Cough: Secondary | ICD-10-CM

## 2018-02-08 DIAGNOSIS — R0683 Snoring: Secondary | ICD-10-CM | POA: Diagnosis not present

## 2018-02-08 DIAGNOSIS — R400 Somnolence: Secondary | ICD-10-CM | POA: Diagnosis not present

## 2018-02-08 NOTE — Progress Notes (Addendum)
[facility]    Provider:  Dr Lucia GaskinsAhern Referring Provider: Claybon JabsBrown, Emily, PA-C Primary Care Physician:  Danielle JabsBrown, Emily, PA-C  CC:  morning  HPI:  Danielle Holt is a 62 y.o. female here as a referral from Dr. Manson PasseyBrown for chronic daily headache.  Past medical history of hypertension, GERD neck and shoulder pain, depression, migraine, hypercholesterolemia, chronic pain, COPD, chronic obstructive pulmonary disease, gait abnormality.   She wakes up with headache every day, she has difficulty sleeping at night, she snores, they are pressure headaches, she wake frequently to urinate, she has dry mouth in the morning. She has gained weight I the last 6 months. She wakes up with them, hard to open her eyes, pressure, hurts, gets better after she takes something excedrin or goody every day. Twice daily. Headaches are severe. Always worse in the morning. Snores a lot. No focal neurologic deficits. She is also congested, started with a cough. No other focal neurologic deficits, associated symptoms, inciting events or modifiable factors.  Reviewed notes, labs and imaging from outside physicians, which showed:  HgbA1c 6.2, B12 437.   Reviewed primary physician notes.  Patient has a history of high blood pressure by report there is good compliance with treatment and good tolerance of treatment.  Patient has a headache onset was gradual years ago she describes this as moderate in severity and unchanged headache occurs daily headache is located in the left frontal area and in the right frontal area symptoms include typical headache features while symptoms do not include nausea, vomiting, photophobia, numbness, tingling or weakness there is no radiation the pain is aching symptoms occur intermittently and do not include fever chills scalp tenderness vertigo vision loss or confusion and have become nearly daily in nature over the past 2 months.  Reviewed MRI brain report(I have no images will request  CD) but report impression "negative exam, no explanation for face pain.   Meds tried: Viibryd, amlodipine, wellburtin, metoprolol, zofran, topamax,   Review of Systems: Patient complains of symptoms per HPI as well as the following symptoms: snoring. Pertinent negatives and positives per HPI. All others negative.   Social History   Socioeconomic History  . Marital status: Single    Spouse name: Not on file  . Number of children: 5  . Years of education: Not on file  . Highest education level: Some college, no degree  Occupational History  . Not on file  Social Needs  . Financial resource strain: Not on file  . Food insecurity:    Worry: Not on file    Inability: Not on file  . Transportation needs:    Medical: Not on file    Non-medical: Not on file  Tobacco Use  . Smoking status: Former Smoker    Packs/day: 0.50    Years: 40.00    Pack years: 20.00    Types: Cigarettes    Last attempt to quit: 2018    Years since quitting: 1.5  . Smokeless tobacco: Never Used  Substance and Sexual Activity  . Alcohol use: No    Alcohol/week: 0.0 oz    Comment: quit 2018  . Drug use: No  . Sexual activity: Yes  Lifestyle  . Physical activity:    Days per week: Not on file    Minutes per session: Not on file  . Stress: Not on file  Relationships  . Social connections:    Talks on phone: Not on file    Gets together: Not on file  Attends religious service: Not on file    Active member of club or organization: Not on file    Attends meetings of clubs or organizations: Not on file    Relationship status: Not on file  . Intimate partner violence:    Fear of current or ex partner: Not on file    Emotionally abused: Not on file    Physically abused: Not on file    Forced sexual activity: Not on file  Other Topics Concern  . Not on file  Social History Narrative   Lives at home alone   Caffeine a few times weekly    Family History  Adopted: Yes  Problem Relation Age of  Onset  . Alcoholism Father     Past Medical History:  Diagnosis Date  . ALLERGIC RHINITIS   . Anemia   . Anxiety   . ARTHRITIS   . ASTHMA   . B12 DEFICIENCY   . CLUSTER HEADACHE   . Colon polyp 07/14/2014   Tubular adenoma  . COPD   . COPD (chronic obstructive pulmonary disease) (HCC)   . Depressive disorder, not elsewhere classified   . Gallstones   . GERD   . Hypercholesterolemia   . HYPERLIPIDEMIA   . Hypertension   . HYPOKALEMIA, MILD   . INSOMNIA   . Iron deficiency   . Morbid obesity (HCC)   . Non-cardiac chest pain   . TOBACCO USE     Past Surgical History:  Procedure Laterality Date  . APPENDECTOMY    . CARPAL TUNNEL RELEASE Bilateral   . CHOLECYSTECTOMY  1991  . GASTRIC SLEEVE  2018  . KNEE ARTHROSCOPY Bilateral   . TONSILLECTOMY      Current Outpatient Medications  Medication Sig Dispense Refill  . atorvastatin (LIPITOR) 20 MG tablet TAKE 1 TABLET BY MOUTH EVERY NIGHT AT BEDTIME 90 tablet 3  . BIOTIN PO Take 500 mg by mouth at bedtime.    Marland Kitchen buPROPion (WELLBUTRIN XL) 150 MG 24 hr tablet TK 2 TS PO QD  1  . Cholecalciferol (VITAMIN D3 PO) Take 1 tablet by mouth daily.    . famotidine (PEPCID) 40 MG tablet TAKE 1 TABLET(40 MG) BY MOUTH AT BEDTIME 90 tablet 3  . Ferrous Sulfate 27 MG TABS Take 1 tablet by mouth daily.    . Flaxseed, Linseed, (FLAXSEED OIL PO) Take 1,000 mg by mouth daily.    . folic acid (FOLVITE) 400 MCG tablet Take 400 mcg by mouth daily.    . furosemide (LASIX) 20 MG tablet TAKE 1 TABLET(20 MG) BY MOUTH DAILY AS NEEDED (Patient taking differently: TAKE 1 TABLET(20 MG) BY MOUTH DAILY) 90 tablet 0  . metoprolol succinate (TOPROL-XL) 25 MG 24 hr tablet Take 25 mg by mouth daily.    . Multiple Vitamins-Minerals (SENTRY SENIOR PO) Take 1 tablet by mouth daily.    Marland Kitchen omega-3 fish oil (MAXEPA) 1000 MG CAPS capsule Take 1 capsule by mouth at bedtime.    . pantoprazole (PROTONIX) 40 MG tablet TAKE 1 TABLET(40 MG) BY MOUTH DAILY 90 tablet 3  .  potassium chloride SA (KLOR-CON M20) 20 MEQ tablet Take 1 tablet (20 mEq total) by mouth 2 (two) times daily. (Patient taking differently: Take 20 mEq by mouth daily. ) 180 tablet 1   No current facility-administered medications for this visit.     Allergies as of 02/08/2018 - Review Complete 02/08/2018  Allergen Reaction Noted  . Fentanyl Other (See Comments) 01/23/2012  . Zolpidem  05/06/2015  .  Ativan [lorazepam]  10/06/2012  . Hyoscyamine Swelling 09/06/2011  . Losartan  02/08/2018  . Varenicline  05/06/2015  . Varenicline tartrate  08/16/2009  . Zolpidem tartrate  01/11/2010  . Amlodipine Other (See Comments) 07/24/2016  . Viibryd [vilazodone hcl]  02/13/2013    Vitals: BP 103/67 (BP Location: Right Arm, Patient Position: Sitting)   Pulse 60   Ht 5\' 2"  (1.575 m)   Wt 230 lb (104.3 kg)   BMI 42.07 kg/m  Last Weight:  Wt Readings from Last 1 Encounters:  02/08/18 230 lb (104.3 kg)   Last Height:   Ht Readings from Last 1 Encounters:  02/08/18 5\' 2"  (1.575 m)   Physical exam: Exam: Gen: NAD, conversant, well nourised, obese, well groomed                     CV: RRR, no MRG. No Carotid Bruits. No peripheral edema, warm, nontender Eyes: Conjunctivae clear without exudates or hemorrhage  Neuro: Detailed Neurologic Exam  Speech:    Speech is normal; fluent and spontaneous with normal comprehension.  Cognition:    The patient is oriented to person, place, and time;     recent and remote memory intact;     language fluent;     normal attention, concentration,     fund of knowledge Cranial Nerves:    The pupils are equal, round, and reactive to light. The fundi are normal and spontaneous venous pulsations are present. Visual fields are full to finger confrontation. Extraocular movements are intact. Trigeminal sensation is intact and the muscles of mastication are normal. The face is symmetric. The palate elevates in the midline. Hearing intact. Voice is normal. Shoulder  shrug is normal. The tongue has normal motion without fasciculations.   Coordination:    Normal finger to nose and heel to shin. Normal rapid alternating movements.   Gait:    Stable with walker  Motor Observation:    No asymmetry, no atrophy, and no involuntary movements noted. Tone:    Normal muscle tone.    Posture:    Posture is normal. normal erect    Strength: proximal LE weakness otherwise strength is V/V in the upper and lower limbs.      Sensation: intact to LT     Reflex Exam:  DTR's:    Deep tendon reflexes in the upper and lower extremities are symmetrical bilaterally.   Toes:    The toes are equiv bilaterally.   Clonus:    Clonus is absent.        Assessment/Plan:  62 year old with very likely sleep apnea  Order sleep study [facility] EBT for eval of airway obtrsution/coughing, sinus amd allergy   Orders Placed This Encounter  Procedures  . Ambulatory referral to Sleep Studies  . Amb[facility] referral to [facility]  . Ambulatory referral to ENT    Discussed OSA  ESS 10 (see scanned)  Naomie Dean, MD  [facility] [address] [place], [place] [address]  Phone [phone] Fax [phone]

## 2018-02-08 NOTE — Patient Instructions (Signed)

## 2018-02-25 ENCOUNTER — Ambulatory Visit (INDEPENDENT_AMBULATORY_CARE_PROVIDER_SITE_OTHER): Payer: Medicare Other | Admitting: Neurology

## 2018-02-25 ENCOUNTER — Encounter: Payer: Self-pay | Admitting: Neurology

## 2018-02-25 VITALS — BP 100/69 | HR 61 | Ht 62.0 in | Wt 233.0 lb

## 2018-02-25 DIAGNOSIS — R0601 Orthopnea: Secondary | ICD-10-CM

## 2018-02-25 DIAGNOSIS — R519 Headache, unspecified: Secondary | ICD-10-CM

## 2018-02-25 DIAGNOSIS — F3341 Major depressive disorder, recurrent, in partial remission: Secondary | ICD-10-CM

## 2018-02-25 DIAGNOSIS — J40 Bronchitis, not specified as acute or chronic: Secondary | ICD-10-CM

## 2018-02-25 DIAGNOSIS — J440 Chronic obstructive pulmonary disease with acute lower respiratory infection: Secondary | ICD-10-CM

## 2018-02-25 DIAGNOSIS — Z6841 Body Mass Index (BMI) 40.0 and over, adult: Secondary | ICD-10-CM

## 2018-02-25 DIAGNOSIS — R0683 Snoring: Secondary | ICD-10-CM

## 2018-02-25 DIAGNOSIS — R51 Headache: Secondary | ICD-10-CM

## 2018-02-25 NOTE — Progress Notes (Signed)
SLEEP MEDICINE CLINIC   Provider:  Melvyn Novasarmen  Teletha Petrea, M D  Primary Care Physician:  Claybon JabsBrown, Emily, PA-C  Dr Naomie DeanAntonia Ahern, MD    Chief Complaint  Patient presents with  . New Patient (Initial Visit)    pt alone, rm 10. pt has never had a sleep study completed before. pt has been told she snores in sleep. complains of fatigure, she wakes up with headaches.     HPI:  Danielle Holt is a 62 y.o. female patient is seen here  in a referral from Dr. Lucia GaskinsAhern, to whom she spoke to last week  02-2018. Chief complaint according to patient : "I have not slept well in a while".  "If I sleep during the day I have a real problem sleeping at night".  Danielle Holt is a right-handed African-American 62 year old lady who is relatively new to our practice as she was just seen last week , thepatient already had an MRI of the brain but dr. Mervyn SkeetersA . was not able to review the images in her initial consultation.  She was referred by physician assistant Claybon JabsEmily Brown.  Her past medical history shows multiple comorbidities that put her at a higher risk for nocturnal hypoxemia, chronic pain can cause chronic insomnia, there is also depression, COPD, hypertension and morning headaches.  The patient carries additional past medical history diagnosis of :anemia anxiety vitamin B12 deficiency which has meanwhile been supplemented, cluster headaches osteoarthritis, hypokalemia and obesity.  I also reviewed her current medication.   Sleep habits are as follows: The patient lives alone and usually eats dinner alone between 4 and 6 PM, he watches usually TV after dinner time through the evening hours, she also reads. She has recently advanced her bedtime to 9 PM from 7 PM- in order to get 5 or 6 hours of sleep. She lives in a bed sit- ( Studio)  and radio, TV are on all night- . She doesn't look at them but needs them for company. She doesn't have a designated bedroom. The studio is cool, but not dark or quiet.  She is asleep  within 30 minutes- and sleep mostly through until her alarm goes off- she rarely dreams. She wakes up every hour when she goes to bed earlier than 9 PM.  She sleeps on multiple pillows 3 , and on her sides ( right)  or prone.  On a retreat she shared a room with others and was told she snores.  Her headaches are present as she wakes up but do not wake her - alarms rings at 4 AM, her driver picks her up at 6 AM.  Headaches are of pressure quality, behind her eyes is a  pressure, and her forehead is throbbing. No nausea, bright light bothers her. Keeps a dry mouth.  She feels not refreshed, and by 1-2 Pm she is already struggling not to nap. After lunch is her low time. She risies on weekends at 8 AM, 4 hours longer in bed .    Sleep medical history : adopted- no family history. Dr. Lucia GaskinsAhern; " Danielle Holt is a 62 y.o. female  With  chronic daily headache.  Past medical history of hypertension, GERD neck and shoulder pain, depression, migraine, hypercholesterolemia, chronic pain, COPD, chronic obstructive pulmonary disease, gait abnormality.  She wakes up tired, not refreshed, not restored - and sleeps irregular hours,  She wakes up with headache every day, she has difficulty sleeping at night, she snores, they are pressure headaches, she wake  frequently to urinate, she has dry mouth in the morning. She has gained weight the last 6 months.  She wakes up with HA,  hard to open her eyes, pressure, hurts, gets better after she takes something excedrin or goody every day. Twice daily. Headaches are severe. Always worse in the morning.  Snores a lot. No focal neurologic deficits. She is also congested, started with a cough. No other focal neurologic deficits, associated symptoms, inciting events or modifiable factors."  Social history:   former Smoker- quit in 2018 , was 1 ppd cigarette smoker from age 62.  3 times divorced -  With  5 children, 4 living.  now single, disabled - she worked in [employer] food [employer].  She is  consuming green tea, rarely coffee in AM- , drinks Sodas in AM when she feels like this. She used to drink a lot of sodas while working in [employer] food, early shifts mostly.     Review of Systems: Out of a complete 14 system review, the patient complains of only the following symptoms, and all other reviewed systems are negative.  Snoring, obesity, chronic pain, chronic HA, avoiding naps.   Epworth score 10/ 24 , Fatigue severity score 54/ 63  , geriatric depression score 3/ 15    Social History   Socioeconomic History  . Marital status: Single    Spouse name: Not on file  . Number of children: 5  . Years of education: Not on file  . Highest education level: Some college, no degree  Occupational History  . Not on file  Social Needs  . Financial resource strain: Not on file  . Food insecurity:    Worry: Not on file    Inability: Not on file  . Transportation needs:    Medical: Not on file    Non-medical: Not on file  Tobacco Use  . Smoking status: Former Smoker    Packs/day: 0.50    Years: 40.00    Pack years: 20.00    Types: Cigarettes    Last attempt to quit: 2018    Years since quitting: 1.6  . Smokeless tobacco: Never Used  Substance and Sexual Activity  . Alcohol use: No    Alcohol/week: 0.0 standard drinks    Comment: quit 2018  . Drug use: No  . Sexual activity: Yes  Lifestyle  . Physical activity:    Days per week: Not on file    Minutes per session: Not on file  . Stress: Not on file  Relationships  . Social connections:    Talks on phone: Not on file    Gets together: Not on file    Attends religious service: Not on file    Active member of club or organization: Not on file    Attends meetings of clubs or organizations: Not on file    Relationship status: Not on file  . Intimate partner violence:    Fear of current or ex partner: Not on file    Emotionally abused: Not on file    Physically abused: Not on file    Forced sexual activity: Not on  file  Other Topics Concern  . Not on file  Social History Narrative   Lives at home alone   Caffeine a few times weekly    Family History  Adopted: Yes  Problem Relation Age of Onset  . Alcoholism Father     Past Medical History:  Diagnosis Date  . ALLERGIC RHINITIS   . Anemia   .  Anxiety   . ARTHRITIS   . ASTHMA   . B12 DEFICIENCY   . CLUSTER HEADACHE   . Colon polyp 07/14/2014   Tubular adenoma  . COPD   . COPD (chronic obstructive pulmonary disease) (HCC)   . Depressive disorder, not elsewhere classified   . Gallstones   . GERD   . Hypercholesterolemia   . HYPERLIPIDEMIA   . Hypertension   . HYPOKALEMIA, MILD   . INSOMNIA   . Iron deficiency   . Morbid obesity (HCC)   . Non-cardiac chest pain   . TOBACCO USE     Past Surgical History:  Procedure Laterality Date  . APPENDECTOMY    . CARPAL TUNNEL RELEASE Bilateral   . CHOLECYSTECTOMY  1991  . GASTRIC SLEEVE  2018  . KNEE ARTHROSCOPY Bilateral   . TONSILLECTOMY      Current Outpatient Medications  Medication Sig Dispense Refill  . atorvastatin (LIPITOR) 20 MG tablet TAKE 1 TABLET BY MOUTH EVERY NIGHT AT BEDTIME 90 tablet 3  . BIOTIN PO Take 500 mg by mouth at bedtime.    Marland Kitchen buPROPion (WELLBUTRIN XL) 150 MG 24 hr tablet TK 2 TS PO QD  1  . Cholecalciferol (VITAMIN D3 PO) Take 1 tablet by mouth daily.    . famotidine (PEPCID) 40 MG tablet TAKE 1 TABLET(40 MG) BY MOUTH AT BEDTIME 90 tablet 3  . Ferrous Sulfate 27 MG TABS Take 1 tablet by mouth daily.    . Flaxseed, Linseed, (FLAXSEED OIL PO) Take 1,000 mg by mouth daily.    . folic acid (FOLVITE) 400 MCG tablet Take 400 mcg by mouth daily.    . furosemide (LASIX) 20 MG tablet TAKE 1 TABLET(20 MG) BY MOUTH DAILY AS NEEDED (Patient taking differently: TAKE 1 TABLET(20 MG) BY MOUTH DAILY) 90 tablet 0  . metoprolol succinate (TOPROL-XL) 25 MG 24 hr tablet Take 25 mg by mouth daily.    . Multiple Vitamins-Minerals (SENTRY SENIOR PO) Take 1 tablet by mouth daily.     Marland Kitchen omega-3 fish oil (MAXEPA) 1000 MG CAPS capsule Take 1 capsule by mouth at bedtime.    . pantoprazole (PROTONIX) 40 MG tablet TAKE 1 TABLET(40 MG) BY MOUTH DAILY 90 tablet 3  . potassium chloride SA (KLOR-CON M20) 20 MEQ tablet Take 1 tablet (20 mEq total) by mouth 2 (two) times daily. (Patient taking differently: Take 20 mEq by mouth daily. ) 180 tablet 1   No current facility-administered medications for this visit.     Allergies as of 02/25/2018 - Review Complete 02/25/2018  Allergen Reaction Noted  . Fentanyl Other (See Comments) 01/23/2012  . Zolpidem  05/06/2015  . Ativan [lorazepam]  10/06/2012  . Hyoscyamine Swelling 09/06/2011  . Losartan  02/08/2018  . Varenicline  05/06/2015  . Varenicline tartrate  08/16/2009  . Zolpidem tartrate  01/11/2010  . Amlodipine Other (See Comments) 07/24/2016  . Viibryd [vilazodone hcl]  02/13/2013    Vitals: BP 100/69   Pulse 61   Ht 5\' 2"  (1.575 m)   Wt 233 lb (105.7 kg)   BMI 42.62 kg/m  Last Weight:  Wt Readings from Last 1 Encounters:  02/25/18 233 lb (105.7 kg)   UJW:JXBJ mass index is 42.62 kg/m.     Last Height:   Ht Readings from Last 1 Encounters:  02/25/18 5\' 2"  (1.575 m)   Respiratory rate is 12/ min, regular pulse, coughing spell at beginning of exam.  Physical exam:  General: The patient is awake, alert and  appears not in acute distress. The patient is dressed for colder weather, and brought a walker.  Head: Normocephalic, atraumatic. Neck is supple. Mallampati 4- all biological teeth. ,  neck circumference: 14. 5 . Nasal airflow restricted , Retrognathia is seen.  Cardiovascular:  Regular rate and rhythm , without  murmurs or carotid bruit, and without distended neck veins. Respiratory: Lungs are clear to auscultation. Skin:  Without evidence of edema, or rash Trunk: BMI is 42. 6 . The patient is seated .  Neurologic exam : The patient is awake and alert, oriented to place and time.  Attention span &  concentration ability appears normal.  Speech is fluent,  without dysarthria, dysphonia or aphasia.  Mood and affect are appropriate.  Cranial nerves: Pupils are equal and briskly reactive to light. Funduscopic exam without  evidence of pallor or edema. Extraocular movements  in vertical and horizontal planes intact and without nystagmus. Visual fields by finger perimetry are intact. Hearing to finger rub intact, but reports high frequency loss on her right  .   Facial sensation intact to fine touch. Facial motor strength is symmetric and tongue and uvula move midline. Shoulder shrug was symmetrical.   Motor exam: Normal tone, muscle bulk and symmetric strength in upper extremities. Sensory:  Fine touch, pinprick and vibration were tested in all extremities. Proprioception tested in the upper extremities was normal. Coordination: Rapid alternating movements in the fingers/hands intact-. Finger-to-nose maneuver normal without evidence of ataxia, dysmetria or tremor.  Gait and station: Patient walks with walker as assistive device-  "for Back pain and shortness of breath "Turns with 4 Steps.  Deep tendon reflexes: in the upper  extremities are symmetric and intact.   Assessment:  After physical and neurologic examination, review of laboratory studies,  Personal review of imaging studies, reports of other /same  Imaging studies, results of polysomnography and / or neurophysiology testing and pre-existing records as far as provided in visit., my assessment is   1) Danielle Holt is a 62 year old right-handed female patient who has been told that she snores extremely loud but has not been informed if any apneas but with witnessed.  Her risk factors for obstructive sleep apnea are related to age, African-American race, retrognathia and weight.  Her neck size is just with an average, her Mallampati is high-grade.    She reports nonrestorative and less refreshing sleep, and endorsed the Epworth sleepiness  score at only 10 points which does not make her excessively sleepy.  She endorsed a high level of fatigue though.  2) contributing to sleepiness and fatigue and also to the morning headaches is possibly her underlying condition of COPD.  She is prone to hypoventilation either through alveolar dysfunction or obesity related hypoventilation.  For this reason her sleep study should include oxygen and carbon dioxide measures.  If she turns out to be a CPAP candidate we need to make sure that her CPAP therapy also raises her oxygen nadir and eliminates hypoxemia.  I also would like to discuss more sleep hygiene with her she already has resumed a healthier bedtime for her, but she has the habit of having other sort sounds and lights in her very restricted living quarters.  I would like for her to eliminate at least the TV and may be just use a background noise machine and audiobook or orchester music without vocals in the background.  My goal is for her to not hamper her own melatonin production so that she can sleep safer through  the night and not having early morning arousals.  I will not adjust any medication.  I will order a split-night study with CO2 measures as described above.    The patient was advised of the nature of the diagnosed disorder , the treatment options and the  risks for general health and wellness arising from not treating the condition.   I spent more than 60 minutes of face to face time with the patient.  Greater than 50% of time was spent in counseling and coordination of care. We have discussed the diagnosis and differential and I answered the patient's questions.    Plan:  Treatment plan and additional workup : She will use a meditation mask and a sleep mask from now on to help her eliminating insomnia risk factors. She has not described pain at night waking her. She will turns the clocks away from her bed.   SPLIT AHI 20, Co2 and hypoxemia, obesity and high grade Mallampati,  loud snoring.      Melvyn Novas, MD 02/25/2018, 11:32 AM  Certified in Neurology by ABPN Certified in Sleep Medicine by [facility] [address] [place], [place] [address]

## 2018-03-20 ENCOUNTER — Ambulatory Visit (INDEPENDENT_AMBULATORY_CARE_PROVIDER_SITE_OTHER): Payer: Medicare Other | Admitting: Neurology

## 2018-03-20 DIAGNOSIS — J440 Chronic obstructive pulmonary disease with acute lower respiratory infection: Secondary | ICD-10-CM

## 2018-03-20 DIAGNOSIS — R51 Headache: Secondary | ICD-10-CM

## 2018-03-20 DIAGNOSIS — R519 Headache, unspecified: Secondary | ICD-10-CM

## 2018-03-20 DIAGNOSIS — G4733 Obstructive sleep apnea (adult) (pediatric): Secondary | ICD-10-CM

## 2018-03-20 DIAGNOSIS — F3341 Major depressive disorder, recurrent, in partial remission: Secondary | ICD-10-CM

## 2018-03-20 DIAGNOSIS — R0601 Orthopnea: Secondary | ICD-10-CM

## 2018-03-20 DIAGNOSIS — Z6841 Body Mass Index (BMI) 40.0 and over, adult: Secondary | ICD-10-CM

## 2018-03-20 DIAGNOSIS — J40 Bronchitis, not specified as acute or chronic: Secondary | ICD-10-CM

## 2018-03-20 DIAGNOSIS — R0683 Snoring: Secondary | ICD-10-CM

## 2018-03-31 DIAGNOSIS — R519 Headache, unspecified: Secondary | ICD-10-CM | POA: Insufficient documentation

## 2018-03-31 DIAGNOSIS — R51 Headache: Secondary | ICD-10-CM

## 2018-03-31 DIAGNOSIS — R0601 Orthopnea: Secondary | ICD-10-CM | POA: Insufficient documentation

## 2018-03-31 DIAGNOSIS — R0683 Snoring: Secondary | ICD-10-CM | POA: Insufficient documentation

## 2018-03-31 DIAGNOSIS — Z6841 Body Mass Index (BMI) 40.0 and over, adult: Secondary | ICD-10-CM

## 2018-03-31 NOTE — Procedures (Signed)
PATIENT'S NAME:  Danielle Holt, Danielle Holt DOB:      13-May-1956      MR#:    [id number]     DATE OF RECORDING: 03/20/2018 by Domingo Cocking REFERRING M.D.:  Naomie Dean, M.D. Study Performed:   Baseline Polysomnogram HISTORY:  62 year old female patient seen on 02-25-2018 with chronic migraines and other headaches, longstanding history of tobacco abuse, caffeine use (sodas) , HTN, Morbid Obesity, GERD, back and neck pain, COPD, and free running sleep habits without established times. She sleeps with her head elevated and wakes frequently with headaches and a dry mouth. She reports being excessively daytime sleepy and napping often, feeling very fatigued.   The patient endorsed the Epworth Sleepiness Scale at 10/ points, the FSS at 54/63 points.   The patient's weight 233 pounds with a height of 62 (inches), resulting in a BMI of 42.63 kg/m2. The patient's neck circumference measured 14.5 inches.  CURRENT MEDICATIONS: Lipitor, Biotin, Wellbutrin XL, Vitamin D3, Pepcid, Ferrous sulfate, Flaxseed oil, Lasix, Toprol XL, Sentry Senior, Protonix, Klor-Con   PROCEDURE:  This is a multichannel digital polysomnogram utilizing the Somnostar 11.2 system.  Electrodes and sensors were applied and monitored per AASM Specifications.   EEG, EOG, Chin and Limb EMG, were sampled at 200 Hz.  ECG, Snore and Nasal Pressure, Thermal Airflow, Respiratory Effort, CPAP Flow and Pressure, Oximetry was sampled at 50 Hz. Digital video and audio were recorded.      BASELINE STUDY: Lights Out was at 00:04 and Lights On at 07:55. Total recording time (TRT) was 472 minutes, with a total sleep time (TST) of 430.5 minutes.  The patient's sleep latency was 25 minutes, REM latency was 81 minutes. The sleep efficiency was 91.2 %.     SLEEP ARCHITECTURE: WASO (Wake after sleep onset) was 21.5 minutes.  There were 2 minutes in Stage N1, 229 minutes Stage N2, 90.5 minutes Stage N3 and 109 minutes in Stage REM.  The percentage of Stage N1 was  .5%, Stage N2 was 53.2%, Stage N3 was 21.0% and Stage R (REM sleep) was 25.3%.   RESPIRATORY ANALYSIS:  There were a total of 60 respiratory events:  37 obstructive apneas, 0 central apneas and 23 hypopneas with many additional respiratory event related arousals (RERAs).     The total APNEA/HYPOPNEA INDEX (AHI) was 8.4/hour.  53 events occurred in REM sleep and 12 events in NREM. The REM AHI was 29.2 /hour, versus a non-REM AHI of 1.3. The patient spent 24.5 minutes of total sleep time in the supine position and 406 minutes in non-supine. The supine AHI was 0.0 versus a non-supine AHI of 8.9.  OXYGEN SATURATION & C02:  The Wake baseline 02 saturation was 96%, with the lowest being 77%. Time spent below 89% saturation equaled 18 minutes. Average End Tidal CO2 during sleep was not measured.    AROUSALS/ PERIODIC LIMB MOVEMENTS: The arousals were noted as: 50 were spontaneous, 5 were associated with PLMs, and only 19 were associated with respiratory events. The patient had a total of 5 Periodic Limb Movements. The Periodic Limb Movement Arousal index was 0.7/hour. Audio and video analysis did not show any abnormal or unusual movements, behaviors, phonations or vocalizations.   The patient took two bathroom breaks. Mild - Moderate Snoring was noted. EKG was in keeping with normal sinus rhythm (NSR). Post-study, the patient indicated that sleep was the same as usual and that she woke with headaches.    IMPRESSION:  1. Mild Obstructive Sleep Apnea (OSA) without  prolonged hypoxemia time.  2. Many spontaneous arousals that seem related to snoring.  3. Primary Snoring  RECOMMENDATIONS:  1. Advise full-night, attended, CPAP titration study to optimize therapy.     I certify that I have reviewed the entire raw data recording prior to the issuance of this report in accordance with the Standards of Accreditation of the American Academy of Sleep Medicine (AASM)     Melvyn Novasarmen Caison Hearn, MD      03-31-2018   Diplomat, American Board of Psychiatry and Neurology  Diplomat, American Board of Sleep Medicine Medical Director, [facility]

## 2018-03-31 NOTE — Addendum Note (Signed)
Addended by: Melvyn NovasHMEIER, Keiffer Piper on: 03/31/2018 05:02 PM   Modules accepted: Orders

## 2018-04-01 ENCOUNTER — Telehealth: Payer: Self-pay | Admitting: Neurology

## 2018-04-01 NOTE — Telephone Encounter (Signed)
-----   Message from Melvyn Novasarmen Dohmeier, MD sent at 03/31/2018  5:02 PM EDT ----- IMPRESSION:  1. Mild Obstructive Sleep Apnea (OSA) without prolonged hypoxemia  time.  2. Many spontaneous arousals that seem related to snoring.  3. Primary Snoring  RECOMMENDATIONS:  1. Advise full-night, attended, CPAP titration study to optimize  therapy.

## 2018-04-01 NOTE — Telephone Encounter (Signed)
I called pt. I advised pt that Dr. Dohmeier reviewed their sleep study results and found that mild sleep apnea and recommends that pt be treated with a cpap. Dr. Dohmeier recommends that pt return for a repeat sleep study in order to properly titrate the cpap and ensure a good mask fit. Pt is agreeable to returning for a titration study. I advised pt that our sleep lab will file with pt's insurance and call pt to schedule the sleep study when we hear back from the pt's insurance regarding coverage of this sleep study. Pt verbalized understanding of results. Pt had no questions at this time but was encouraged to call back if questions arise.   

## 2018-04-11 ENCOUNTER — Encounter (INDEPENDENT_AMBULATORY_CARE_PROVIDER_SITE_OTHER): Payer: Medicare Other

## 2018-04-17 ENCOUNTER — Encounter (INDEPENDENT_AMBULATORY_CARE_PROVIDER_SITE_OTHER): Payer: Self-pay

## 2018-04-17 ENCOUNTER — Ambulatory Visit (INDEPENDENT_AMBULATORY_CARE_PROVIDER_SITE_OTHER): Payer: Medicare Other | Admitting: Family Medicine

## 2018-04-22 ENCOUNTER — Ambulatory Visit (INDEPENDENT_AMBULATORY_CARE_PROVIDER_SITE_OTHER): Payer: Medicare Other | Admitting: Neurology

## 2018-04-22 DIAGNOSIS — F3341 Major depressive disorder, recurrent, in partial remission: Secondary | ICD-10-CM

## 2018-04-22 DIAGNOSIS — R0683 Snoring: Secondary | ICD-10-CM

## 2018-04-22 DIAGNOSIS — Z6841 Body Mass Index (BMI) 40.0 and over, adult: Secondary | ICD-10-CM

## 2018-04-22 DIAGNOSIS — R0601 Orthopnea: Secondary | ICD-10-CM

## 2018-04-22 DIAGNOSIS — J440 Chronic obstructive pulmonary disease with acute lower respiratory infection: Secondary | ICD-10-CM

## 2018-04-22 DIAGNOSIS — G4733 Obstructive sleep apnea (adult) (pediatric): Secondary | ICD-10-CM

## 2018-04-22 DIAGNOSIS — R519 Headache, unspecified: Secondary | ICD-10-CM

## 2018-04-22 DIAGNOSIS — J40 Bronchitis, not specified as acute or chronic: Secondary | ICD-10-CM

## 2018-04-22 DIAGNOSIS — R51 Headache: Secondary | ICD-10-CM

## 2018-04-24 NOTE — Procedures (Signed)
PATIENT'S NAME:  Danielle Holt, Danielle Holt DOB:      September 18, 1955      MR#:    [id number]     DATE OF RECORDING: 04/22/2018 REFERRING M.D.:  Naomie Dean, MD Study Performed:   Titration to CPAP HISTORY:  Mrs. Wolgamott returns for CPAP titration following her PSG performed on 03/20/2018 which resulted in an AHI of 8.4/h, REM AHI of 29.2/h, hypoxemia of 18 minutes and Nadir SpO2 of 77%. The diagnosis was OSA, with many RERAS.    The patient endorsed the Epworth Sleepiness Scale at 10 points.   The patient's weight 234 pounds with a height of 62 (inches), resulting in a BMI of 43.2 kg/m2. The patient's neck circumference measured 14 inches.  CURRENT MEDICATIONS: Lipitor, Biotin, Wellbutrin XL, Vitamin D3, Pepcid, Ferrous sulfate, Flaxseed oil, Lasix, Toprol XL, Sentry Senior, Maxepa, Protonix, Klor-Con   PROCEDURE:  This is a multichannel digital polysomnogram utilizing the SomnoStar 11.2 system.  Electrodes and sensors were applied and monitored per AASM Specifications.   EEG, EOG, Chin and Limb EMG, were sampled at 200 Hz.  ECG, Snore and Nasal Pressure, Thermal Airflow, Respiratory Effort, CPAP Flow and Pressure, Oximetry was sampled at 50 Hz. Digital video and audio were recorded.      CPAP was initiated at 5 cmH20 with heated humidity per AASM split night standards and pressure was advanced to 10 cm H20 because of hypopneas, apneas and desaturations.  At a PAP pressure of 10 cmH20, there was a reduction of the AHI to 0.0/h with improvement of sleep apnea and snoring.  Lights Out was at 21:58 and Lights On at 04:55. Total recording time (TRT) was 417 minutes, with a total sleep time (TST) of 343 minutes. The patient's sleep latency was 37.5 minutes. REM latency was 98 minutes.  The sleep efficiency was 82.3 %.    SLEEP ARCHITECTURE: WASO (Wake after sleep onset) was 45 minutes.  There were 37 minutes in Stage N1, 141.5 minutes Stage N2, 118 minutes Stage N3 and 46.5 minutes in Stage REM.  The percentage of  Stage N1 was 10.8%, Stage N2 was 41.3%, Stage N3 was 34.4% and Stage R (REM sleep) was 13.6%.  The arousals were noted as: 58 were spontaneous, 0 were associated with PLMs, and 0 were associated with respiratory events.  RESPIRATORY ANALYSIS:  There was a total of 0 respiratory events: 0 apneas and 0 hypopneas with 0 respiratory event related arousals (RERAs).     The total APNEA/HYPOPNEA INDEX (AHI) was 0/hour and the total RESPIRATORY DISTURBANCE INDEX was 0 /hour.  The patient spent the total sleep time in the non-supine position.  OXYGEN SATURATION & C02:  The baseline 02 saturation was 96%, with the lowest being 84%. Time spent below 89% saturation equaled 26 minutes. PERIODIC LIMB MOVEMENTS:  The patient had a total of 0 Periodic Limb Movements.   Audio and video analysis did not show any abnormal or unusual movements, behaviors, phonations or vocalizations.  Snoring was noted at 10 cm water, the patient did move the mask frequently. Respironics Dreamwear small sized nasal cradle. EKG was in keeping with normal sinus rhythm (NSR).  Post-study, the patient indicated that sleep was the same as usual.    DIAGNOSIS Mild Obstructive Sleep Apnea with borderline hypoxemia, mostly hypopneas and UARS.  All responded to CPAP at 6-10 cm water, but snoring was alleviated at 10 cm water. The patient was fitted with a Respironics Dreamwear small mask.    PLANS/RECOMMENDATIONS: I will order an autotitration  device 6-12 cm water with 3 cm EPR. 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  1. Educate patient in sleep hygiene measures.   2. Maintain lean body weight. 3. The patient should avoid sedatives, hypnotics, and alcoholic beverage consumption before bedtime. 4. CPAP therapy compliance is defined as 4 hours or more of nightly use.  A follow up appointment will be scheduled in the Sleep Clinic at [facility].   Please call [phone] with any questions.      I certify that I  have reviewed the entire raw data recording prior to the issuance of this report in accordance with the Standards of Accreditation of the American Academy of Sleep Medicine (AASM)    Melvyn Novas, M.D.  04-24-2018  Diplomat, American Board of Psychiatry and Neurology  Diplomat, American Board of Sleep Medicine Medical Director, [facility]

## 2018-04-24 NOTE — Addendum Note (Signed)
Addended by: Melvyn Novas on: 04/24/2018 05:34 PM   Modules accepted: Orders

## 2018-04-25 ENCOUNTER — Telehealth: Payer: Self-pay | Admitting: Neurology

## 2018-04-25 NOTE — Telephone Encounter (Signed)
I called pt. I advised pt that Dr. Vickey Huger reviewed their sleep study results and found that pt was treated with CPAP at a pressure of 10 cm water pressure. Dr. Vickey Huger recommends that pt starts auto CPAP 6-12 cm water pressure. I reviewed PAP compliance expectations with the pt. Pt is agreeable to starting a CPAP. I advised pt that an order will be sent to a DME, [facility], and [facility] will call the pt within about one week after they file with the pt's insurance. [facility] will show the pt how to use the machine, fit for masks, and troubleshoot the CPAP if needed. A follow up appt was made for insurance purposes with Dr. Vickey Huger on Jan 16,2020 at 8:30 am. Pt verbalized understanding to arrive 15 minutes early and bring their CPAP. A letter with all of this information in it will be mailed to the pt as a reminder. I verified with the pt that the address we have on file is correct. Pt verbalized understanding of results. Pt had no questions at this time but was encouraged to call back if questions arise. I have sent the order to [facility] and have received confirmation that they have received the order.

## 2018-04-25 NOTE — Telephone Encounter (Signed)
-----   Message from Melvyn Novas, MD sent at 04/24/2018  5:34 PM EDT ----- DIAGNOSIS Mild Obstructive Sleep Apnea with borderline hypoxemia, mostly  hypopneas and UARS. All responded to CPAP at 6-10 cm water, but  snoring was alleviated at 10 cm water. The patient was fitted  with a Respironics Dreamwear small mask.   PLANS/RECOMMENDATIONS: I will order an autotitration device 6-12  cm water with 3 cm EPR. 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  1. Educate patient in sleep hygiene measures.  2. Maintain lean body weight. 3. The patient should avoid sedatives, hypnotics, and alcoholic  beverage consumption before bedtime. 4. CPAP therapy compliance is defined as 4 hours or more of  nightly use.

## 2018-07-25 ENCOUNTER — Ambulatory Visit: Payer: Self-pay | Admitting: Neurology

## 2018-07-31 ENCOUNTER — Ambulatory Visit: Payer: Self-pay | Admitting: Adult Health

## 2018-08-02 ENCOUNTER — Ambulatory Visit: Payer: Self-pay | Admitting: Adult Health

## 2018-08-07 ENCOUNTER — Ambulatory Visit (INDEPENDENT_AMBULATORY_CARE_PROVIDER_SITE_OTHER): Payer: Medicare Other | Admitting: Adult Health

## 2018-08-07 ENCOUNTER — Encounter: Payer: Self-pay | Admitting: Adult Health

## 2018-08-07 ENCOUNTER — Ambulatory Visit: Payer: Self-pay | Admitting: Adult Health

## 2018-08-07 VITALS — BP 106/69 | HR 96 | Ht 62.0 in | Wt 235.4 lb

## 2018-08-07 DIAGNOSIS — G43719 Chronic migraine without aura, intractable, without status migrainosus: Secondary | ICD-10-CM | POA: Diagnosis not present

## 2018-08-07 DIAGNOSIS — G4733 Obstructive sleep apnea (adult) (pediatric): Secondary | ICD-10-CM

## 2018-08-07 DIAGNOSIS — Z9989 Dependence on other enabling machines and devices: Secondary | ICD-10-CM | POA: Diagnosis not present

## 2018-08-07 MED ORDER — TOPIRAMATE 50 MG PO TABS: 50.0000 mg | ORAL_TABLET | Freq: Every day | ORAL | 3 refills | Status: DC

## 2018-08-07 NOTE — Progress Notes (Signed)
[facility]   Provider:  Melvyn Novas, M D  Primary Care Physician:  Claybon Jabs, PA-C     Chief Complaint  Patient presents with  . Follow-up    cpap follow up. Alone. Treatment room. Patient mentioned that she is still having some migraines even since she has been using her cpap machine.    HPI: 08/07/18  Danielle Holt is being seen today for initial CPAP follow-up visit.  She did undergo sleep study in 03/2018 due to continued morning migraines with diagnosis of mild OSA without prolonged hypoxemia and snoring and recommended CPAP titration study with initiation of CPAP at home.  Download report shows compliance of 26 out of 30 days for 87% compliance rate and with 20 days (67%) greater than 4 hours usage.  Average usage 5 hours and 22 minutes.  Minimum pressure setting 6 cm H2O with max pressure setting 12 cm H2O with an EPR level 2.  Resultant AHI 2.1.  Pressure 95th percentile 10.9 cm H2O.  Leaks 95th percentile 14.7 L/min.  She does endorse difficulties at times tolerating mask due to feeling as though her mouth and lips get too dried out and feels "bloated" in the morning as that she has "been filled up with air".  She states she will typically lay down around 9 or 10 PM and will wake up around 4 AM to start her day.  She does report continued migraines despite adequate compliance with CPAP.  She reports she wakes up approximately 3 mornings out of the week with frontal migraines associated with photophobia.  She has tried multiple prophylactic migraine medications in the past including duloxetine, topiramate, Depakote and is currently on a beta-blocker metoprolol.  She endorses no relief from prior therapy.  She does state that these medications were trialed "years ago" and does not remember exact dosages.  Discussion regarding possible use of injectable migraine preventative such as Aimovig or use of Botox injections.  Patient strongly declines Botox injections but does  request additional trial of Topamax and if she receives no relief from Topamax, she will consider use of Aimovig.  She does continue to have elevated fatigue severity scale at 47 but decreased from prior at 58.  No further does state that she is "constantly on the go" and feels as though this is the reason for her fatigue.  She does endorse some improvement since initiating CPAP.   Initial consult visit CD:  Danielle Holt is a right-handed African-American 63 year old lady who is relatively new to our practice as she was just seen last week , thepatient already had an MRI of the brain but dr. Mervyn Skeeters . was not able to review the images in her initial consultation.  She was referred by physician assistant Claybon Jabs.  Her past medical history shows multiple comorbidities that put her at a higher risk for nocturnal hypoxemia, chronic pain can cause chronic insomnia, there is also depression, COPD, hypertension and morning headaches.  The patient carries additional past medical history diagnosis of :anemia anxiety vitamin B12 deficiency which has meanwhile been supplemented, cluster headaches osteoarthritis, hypokalemia and obesity.  I also reviewed her current medication.   Sleep habits are as follows: The patient lives alone and usually eats dinner alone between 4 and 6 PM, he watches usually TV after dinner time through the evening hours, she also reads. She has recently advanced her bedtime to 9 PM from 7 PM- in order to get 5 or 6 hours of sleep. She lives  in a bed sit- ( Studio)  and radio, TV are on all night- . She doesn't look at them but needs them for company. She doesn't have a designated bedroom. The studio is cool, but not dark or quiet.  She is asleep within 30 minutes- and sleep mostly through until her alarm goes off- she rarely dreams. She wakes up every hour when she goes to bed earlier than 9 PM.  She sleeps on multiple pillows 3 , and on her sides ( right)  or prone.  On a retreat she shared  a room with others and was told she snores.  Her headaches are present as she wakes up but do not wake her - alarms rings at 4 AM, her driver picks her up at 6 AM.  Headaches are of pressure quality, behind her eyes is a  pressure, and her forehead is throbbing. No nausea, bright light bothers her. Keeps a dry mouth.  She feels not refreshed, and by 1-2 Pm she is already struggling not to nap. After lunch is her low time. She risies on weekends at 8 AM, 4 hours longer in bed .     Review of Systems: Out of a complete 14 system review, the patient complains of only the following symptoms, and all other reviewed systems are negative. Headache and fatigue  Epworth score 6/ 24 , Fatigue severity score 47/ 63     Social History   Socioeconomic History  . Marital status: Single    Spouse name: Not on file  . Number of children: 5  . Years of education: Not on file  . Highest education level: Some college, no degree  Occupational History  . Not on file  Social Needs  . Financial resource strain: Not on file  . Food insecurity:    Worry: Not on file    Inability: Not on file  . Transportation needs:    Medical: Not on file    Non-medical: Not on file  Tobacco Use  . Smoking status: Former Smoker    Packs/day: 0.50    Years: 40.00    Pack years: 20.00    Types: Cigarettes    Last attempt to quit: 2018    Years since quitting: 2.0  . Smokeless tobacco: Never Used  Substance and Sexual Activity  . Alcohol use: No    Alcohol/week: 0.0 standard drinks    Comment: quit 2018  . Drug use: No  . Sexual activity: Yes  Lifestyle  . Physical activity:    Days per week: Not on file    Minutes per session: Not on file  . Stress: Not on file  Relationships  . Social connections:    Talks on phone: Not on file    Gets together: Not on file    Attends religious service: Not on file    Active member of club or organization: Not on file    Attends meetings of clubs or organizations: Not  on file    Relationship status: Not on file  . Intimate partner violence:    Fear of current or ex partner: Not on file    Emotionally abused: Not on file    Physically abused: Not on file    Forced sexual activity: Not on file  Other Topics Concern  . Not on file  Social History Narrative   Lives at home alone   Caffeine a few times weekly    Family History  Adopted: Yes  Problem Relation Age  of Onset  . Alcoholism Father     Past Medical History:  Diagnosis Date  . ALLERGIC RHINITIS   . Anemia   . Anxiety   . ARTHRITIS   . ASTHMA   . B12 DEFICIENCY   . CLUSTER HEADACHE   . Colon polyp 07/14/2014   Tubular adenoma  . COPD   . COPD (chronic obstructive pulmonary disease) (HCC)   . Depressive disorder, not elsewhere classified   . Gallstones   . GERD   . Hypercholesterolemia   . HYPERLIPIDEMIA   . Hypertension   . HYPOKALEMIA, MILD   . INSOMNIA   . Iron deficiency   . Morbid obesity (HCC)   . Non-cardiac chest pain   . TOBACCO USE     Past Surgical History:  Procedure Laterality Date  . APPENDECTOMY    . CARPAL TUNNEL RELEASE Bilateral   . CHOLECYSTECTOMY  1991  . GASTRIC SLEEVE  2018  . KNEE ARTHROSCOPY Bilateral   . TONSILLECTOMY      Current Outpatient Medications  Medication Sig Dispense Refill  . atorvastatin (LIPITOR) 20 MG tablet TAKE 1 TABLET BY MOUTH EVERY NIGHT AT BEDTIME 90 tablet 3  . BIOTIN PO Take 500 mg by mouth at bedtime.    Marland Kitchen buPROPion (WELLBUTRIN XL) 150 MG 24 hr tablet TK 2 TS PO QD  1  . Cholecalciferol (VITAMIN D3 PO) Take 1 tablet by mouth daily.    . famotidine (PEPCID) 40 MG tablet TAKE 1 TABLET(40 MG) BY MOUTH AT BEDTIME 90 tablet 3  . Ferrous Sulfate 27 MG TABS Take 1 tablet by mouth daily.    . Flaxseed, Linseed, (FLAXSEED OIL PO) Take 1,000 mg by mouth daily.    . folic acid (FOLVITE) 400 MCG tablet Take 400 mcg by mouth daily.    . furosemide (LASIX) 20 MG tablet TAKE 1 TABLET(20 MG) BY MOUTH DAILY AS NEEDED (Patient  taking differently: TAKE 1 TABLET(20 MG) BY MOUTH DAILY) 90 tablet 0  . metoprolol succinate (TOPROL-XL) 25 MG 24 hr tablet Take 25 mg by mouth daily.    . Multiple Vitamins-Minerals (SENTRY SENIOR PO) Take 1 tablet by mouth daily.    Marland Kitchen omega-3 fish oil (MAXEPA) 1000 MG CAPS capsule Take 1 capsule by mouth at bedtime.    . pantoprazole (PROTONIX) 40 MG tablet TAKE 1 TABLET(40 MG) BY MOUTH DAILY 90 tablet 3  . potassium chloride SA (KLOR-CON M20) 20 MEQ tablet Take 1 tablet (20 mEq total) by mouth 2 (two) times daily. (Patient taking differently: Take 20 mEq by mouth daily. ) 180 tablet 1  . topiramate (TOPAMAX) 50 MG tablet Take 1 tablet (50 mg total) by mouth at bedtime. 30 tablet 3   No current facility-administered medications for this visit.     Allergies as of 08/07/2018 - Review Complete 08/07/2018  Allergen Reaction Noted  . Fentanyl Other (See Comments) 01/23/2012  . Zolpidem  05/06/2015  . Ativan [lorazepam]  10/06/2012  . Hyoscyamine Swelling 09/06/2011  . Losartan  02/08/2018  . Varenicline  05/06/2015  . Varenicline tartrate  08/16/2009  . Zolpidem tartrate  01/11/2010  . Amlodipine Other (See Comments) 07/24/2016  . Viibryd [vilazodone hcl]  02/13/2013    Vitals: BP 106/69   Pulse 96   Ht 5\' 2"  (1.575 m)   Wt 235 lb 6.4 oz (106.8 kg)   BMI 43.06 kg/m  Last Weight:  Wt Readings from Last 1 Encounters:  08/07/18 235 lb 6.4 oz (106.8 kg)   ZOX:WRUE  mass index is 43.06 kg/m.     Last Height:   Ht Readings from Last 1 Encounters:  08/07/18 5\' 2"  (1.575 m)   Respiratory rate is 12/ min, regular pulse, coughing spell at beginning of exam.  Physical exam:  General: The patient is awake, alert and appears not in acute distress. The patient is dressed for colder weather, and brought a walker.  Head: Normocephalic, atraumatic. Neck is supple. Mallampati 4- all biological teeth. ,  neck circumference: 14. 5 . Nasal airflow restricted , Retrognathia is seen.    Cardiovascular:  Regular rate and rhythm , without  murmurs or carotid bruit, and without distended neck veins. Respiratory: Lungs are clear to auscultation. Skin:  Without evidence of edema, or rash Trunk: BMI is 43.06 . The patient is seated .  Neurologic exam : The patient is awake and alert, oriented to place and time.  Attention span & concentration ability appears normal.  Speech is fluent,  without dysarthria, dysphonia or aphasia.  Mood and affect are appropriate.  Cranial nerves: Pupils are equal and briskly reactive to light. Funduscopic exam without  evidence of pallor or edema. Extraocular movements  in vertical and horizontal planes intact and without nystagmus. Visual fields by finger perimetry are intact. Hearing to finger rub intact, but reports high frequency loss on her right  .   Facial sensation intact to fine touch. Facial motor strength is symmetric and tongue and uvula move midline. Shoulder shrug was symmetrical.   Motor exam: Normal tone, muscle bulk and symmetric strength in upper extremities. Sensory:  Fine touch, pinprick and vibration were tested in all extremities. Proprioception tested in the upper extremities was normal. Coordination: Rapid alternating movements in the fingers/hands intact-. Finger-to-nose maneuver normal without evidence of ataxia, dysmetria or tremor.  Gait and station: Patient walks with walker as assistive device-  "for Back pain and shortness of breath "Turns with 4 Steps.  Deep tendon reflexes: in the upper  extremities are symmetric and intact.     Assessment: Danielle Holt is a 63 year old female who was initially evaluated in this office for migraines by Dr. Lucia GaskinsAhern and was referred to assess for potential sleep apnea due to recurrent morning migraines.  She was found to have OSA and CPAP initiated with adequate compliance at 87% over 30 days and 96% compliance over 90 days.  Despite use, she continues to complain of awakening with  migraines approximately 3 days of the week.   Plan:  Treatment plan and additional workup :  Initiate topiramate 50 mg nightly for continued migraines Advised patient to call office after 2363-month trial of medication along with continued CPAP use if she continues to experience migraines.  She can schedule appointment with Amy, NP for further discussion regarding possible use of injectable migraine preventative as she has no interest in Botox use. Advised her to continue use of CPAP and no indication for any changes at this time due to satisfactory management of OSA  Follow-up in 6 months for sleep apnea follow-up with Aundra MilletMegan, NP or call earlier if needed  Greater than 50% of time during this 25 minute visit was spent on counseling,explanation of diagnosis of OSA with continued use of CPAP, discussion regarding migraines and importance to rule out OSA as untreated OSA can worsen or be the cause of morning headaches, planning of further management along with restarting trial of Topamax, discussion with patient and coordination of care     Danielle Holt, AGNP-BC  [facility] [phone]  [address] [place], [place] [address]  Phone [phone] Fax [phone] Note: This document was prepared with digital dictation and possible smart phrase technology. Any transcriptional errors that result from this process are unintentional.

## 2018-08-07 NOTE — Patient Instructions (Signed)
Your Plan:  Continue good compliance of CPAP   We will start topamax 50mg  nightly for migraines. After a couple months, if you do not notice any relief, call office and we can schedule you to see Amy, NP for potential starting of Aimovig  Follow up for CPAP in 6 months       Thank you for coming to see [place] at [facility]. I hope we have been able to provide you high quality care today.  You may receive a patient satisfaction survey over the next few weeks. We would appreciate your feedback and comments so that we may continue to improve ourselves and the health of our patients.

## 2018-08-07 NOTE — Progress Notes (Signed)
Made any corrections needed, and agree with history, physical, neuro exam,assessment and plan as stated.     Glendene Wyer, MD [facility]  

## 2018-10-17 ENCOUNTER — Other Ambulatory Visit: Payer: Self-pay

## 2018-10-17 MED ORDER — TOPIRAMATE 50 MG PO TABS: 50.0000 mg | ORAL_TABLET | Freq: Every day | ORAL | 3 refills | Status: DC

## 2018-12-30 ENCOUNTER — Encounter: Payer: Self-pay | Admitting: Gastroenterology

## 2019-02-05 ENCOUNTER — Ambulatory Visit: Payer: Medicare Other | Admitting: Adult Health

## 2019-02-13 ENCOUNTER — Ambulatory Visit: Payer: Medicare Other | Admitting: Adult Health

## 2019-02-19 ENCOUNTER — Other Ambulatory Visit: Payer: Self-pay | Admitting: Pain Medicine

## 2019-02-19 DIAGNOSIS — M545 Low back pain, unspecified: Secondary | ICD-10-CM

## 2019-02-20 ENCOUNTER — Other Ambulatory Visit: Payer: Self-pay

## 2019-02-20 MED ORDER — TOPIRAMATE 50 MG PO TABS: 50.0000 mg | ORAL_TABLET | Freq: Every day | ORAL | 3 refills | Status: DC

## 2019-03-03 ENCOUNTER — Encounter: Payer: Self-pay | Admitting: Family Medicine

## 2019-03-03 ENCOUNTER — Ambulatory Visit (INDEPENDENT_AMBULATORY_CARE_PROVIDER_SITE_OTHER): Payer: Medicare Other | Admitting: Family Medicine

## 2019-03-03 ENCOUNTER — Other Ambulatory Visit: Payer: Self-pay

## 2019-03-03 VITALS — BP 115/83 | HR 68 | Temp 97.3°F | Ht 62.0 in | Wt 239.6 lb

## 2019-03-03 DIAGNOSIS — G4733 Obstructive sleep apnea (adult) (pediatric): Secondary | ICD-10-CM

## 2019-03-03 DIAGNOSIS — G43709 Chronic migraine without aura, not intractable, without status migrainosus: Secondary | ICD-10-CM

## 2019-03-03 NOTE — Progress Notes (Signed)
PATIENT: Danielle Holt DOB: 10-25-1955  REASON FOR VISIT: follow up HISTORY FROM: patient  Chief Complaint  Patient presents with   Follow-up    Treatment room, OSA/CPAP f/u. "needs new facemask"     HISTORY OF PRESENT ILLNESS: Today 03/03/19 Danielle Holt is a 63 y.o. female here today for follow up of migraines and CPAP.  She admits that she has not used CPAP since March due to needing her mask replaced. She has seen PCP for continued headaches and reports she was started on Emgality last month. She has noted decreased frequency and severity of headaches. She continues topiramate as well. She is tolerating medications with no obvious adverse effects. She continues to see psychiatry for anxiety.   HISTORY: (copied from [facility] note on 08/07/2018)  Ms. Meeker is being seen today for initial CPAP follow-up visit.  She did undergo sleep study in 03/2018 due to continued morning migraines with diagnosis of mild OSA without prolonged hypoxemia and snoring and recommended CPAP titration study with initiation of CPAP at home.  Download report shows compliance of 26 out of 30 days for 87% compliance rate and with 20 days (67%) greater than 4 hours usage.  Average usage 5 hours and 22 minutes.  Minimum pressure setting 6 cm H2O with max pressure setting 12 cm H2O with an EPR level 2.  Resultant AHI 2.1.  Pressure 95th percentile 10.9 cm H2O.  Leaks 95th percentile 14.7 L/min.  She does endorse difficulties at times tolerating mask due to feeling as though her mouth and lips get too dried out and feels "bloated" in the morning as that she has "been filled up with air".  She states she will typically lay down around 9 or 10 PM and will wake up around 4 AM to start her day.  She does report continued migraines despite adequate compliance with CPAP.  She reports she wakes up approximately 3 mornings out of the week with frontal migraines associated with photophobia.  She has tried multiple  prophylactic migraine medications in the past including duloxetine, topiramate, Depakote and is currently on a beta-blocker metoprolol.  She endorses no relief from prior therapy.  She does state that these medications were trialed "years ago" and does not remember exact dosages.  Discussion regarding possible use of injectable migraine preventative such as Aimovig or use of Botox injections.  Patient strongly declines Botox injections but does request additional trial of Topamax and if she receives no relief from Topamax, she will consider use of Aimovig.  She does continue to have elevated fatigue severity scale at 47 but decreased from prior at 58.  No further does state that she is "constantly on the go" and feels as though this is the reason for her fatigue.  She does endorse some improvement since initiating CPAP.   Initial consult visit CD:  Mrs. Frohlich is a right-handed African-American 63 year old lady who is relatively new to our practice as she was just seen last week , thepatient already had an MRI of the brain but dr. Loni Muse . was not able to review the images in her initial consultation.  She was referred by physician assistant Heber .  Her past medical history shows multiple comorbidities that put her at a higher risk for nocturnal hypoxemia, chronic pain can cause chronic insomnia, there is also depression, COPD, hypertension and morning headaches.  The patient carries additional past medical history diagnosis of :anemia anxiety vitamin B12 deficiency which has meanwhile been supplemented,  cluster headaches osteoarthritis, hypokalemia and obesity.  I also reviewed her current medication.   Sleep habits are as follows: The patient lives alone and usually eats dinner alone between 4 and 6 PM, he watches usually TV after dinner time through the evening hours, she also reads. She has recently advanced her bedtime to 9 PM from 7 PM- in order to get 5 or 6 hours of sleep. She lives in a  bed sit- ( Studio)  and radio, TV are on all night- . She doesn't look at them but needs them for company. She doesn't have a designated bedroom. The studio is cool, but not dark or quiet.  She is asleep within 30 minutes- and sleep mostly through until her alarm goes off- she rarely dreams. She wakes up every hour when she goes to bed earlier than 9 PM.  She sleeps on multiple pillows 3 , and on her sides ( right)  or prone.  On a retreat she shared a room with others and was told she snores.  Her headaches are present as she wakes up but do not wake her - alarms rings at 4 AM, her driver picks her up at 6 AM.  Headaches are of pressure quality, behind her eyes is a  pressure, and her forehead is throbbing. No nausea, bright light bothers her. Keeps a dry mouth.  She feels not refreshed, and by 1-2 Pm she is already struggling not to nap. After lunch is her low time. She risies on weekends at 8 AM, 4 hours longer in bed.    REVIEW OF SYSTEMS: Out of a complete 14 system review of symptoms, the patient complains only of the following symptoms, headaches, and all other reviewed systems are negative.   ALLERGIES: Allergies  Allergen Reactions   Fentanyl Other (See Comments)    Hallucinations per pt   Zolpidem     Other reaction(s): Other (See Comments) Hallucinations.   Ativan [Lorazepam]     Memory loss   Hyoscyamine Swelling   Losartan     Feels spacey   Varenicline     REACTION: amnesia   Varenicline Tartrate     REACTION: amnesia   Zolpidem Tartrate     REACTION: Short term memory loss   Amlodipine Other (See Comments)    Ankle edema   Viibryd [Vilazodone Hcl]     Visual disturbance    HOME MEDICATIONS: Outpatient Medications Prior to Visit  Medication Sig Dispense Refill   albuterol (VENTOLIN HFA) 108 (90 Base) MCG/ACT inhaler INHALE 1 PUFF QID PRN     atorvastatin (LIPITOR) 20 MG tablet TAKE 1 TABLET BY MOUTH EVERY NIGHT AT BEDTIME 90 tablet 3   baclofen  (LIORESAL) 10 MG tablet TK 1 T PO BID PRN FOR MUSCLE SPASMS     buPROPion (WELLBUTRIN XL) 150 MG 24 hr tablet TK 2 TS PO QD  1   DEXILANT 60 MG capsule TK 1 C PO QD     Ferrous Sulfate 27 MG TABS Take 1 tablet by mouth daily.     Flaxseed, Linseed, (FLAXSEED OIL PO) Take 1,000 mg by mouth daily.     folic acid (FOLVITE) 400 MCG tablet Take 400 mcg by mouth daily.     HYDROcodone-acetaminophen (NORCO) 7.5-325 MG tablet TK 1 T PO TID PRN     hydrOXYzine (ATARAX/VISTARIL) 50 MG tablet Take 1 tab tid prn anxiety, sleep     Multiple Vitamins-Minerals (SENTRY SENIOR PO) Take 1 tablet by mouth daily.  omega-3 fish oil (MAXEPA) 1000 MG CAPS capsule Take 1 capsule by mouth at bedtime.     pantoprazole (PROTONIX) 40 MG tablet TAKE 1 TABLET(40 MG) BY MOUTH DAILY 90 tablet 3   topiramate (TOPAMAX) 50 MG tablet Take 1 tablet (50 mg total) by mouth at bedtime. 30 tablet 3   Vitamin D, Ergocalciferol, (DRISDOL) 1.25 MG (50000 UT) CAPS capsule TK 1 C PO ONCE WEEKLY     BIOTIN PO Take 500 mg by mouth at bedtime.     Cholecalciferol (VITAMIN D3 PO) Take 1 tablet by mouth daily.     famotidine (PEPCID) 40 MG tablet TAKE 1 TABLET(40 MG) BY MOUTH AT BEDTIME 90 tablet 3   furosemide (LASIX) 20 MG tablet TAKE 1 TABLET(20 MG) BY MOUTH DAILY AS NEEDED (Patient taking differently: TAKE 1 TABLET(20 MG) BY MOUTH DAILY) 90 tablet 0   metoprolol succinate (TOPROL-XL) 25 MG 24 hr tablet Take 25 mg by mouth daily.     potassium chloride SA (KLOR-CON M20) 20 MEQ tablet Take 1 tablet (20 mEq total) by mouth 2 (two) times daily. (Patient taking differently: Take 20 mEq by mouth daily. ) 180 tablet 1   No facility-administered medications prior to visit.     PAST MEDICAL HISTORY: Past Medical History:  Diagnosis Date   ALLERGIC RHINITIS    Anemia    Anxiety    ARTHRITIS    ASTHMA    B12 DEFICIENCY    CLUSTER HEADACHE    Colon polyp 07/14/2014   Tubular adenoma   COPD    COPD (chronic  obstructive pulmonary disease) (HCC)    Depressive disorder, not elsewhere classified    Gallstones    GERD    Hypercholesterolemia    HYPERLIPIDEMIA    Hypertension    HYPOKALEMIA, MILD    INSOMNIA    Iron deficiency    Morbid obesity (HCC)    Non-cardiac chest pain    TOBACCO USE     PAST SURGICAL HISTORY: Past Surgical History:  Procedure Laterality Date   APPENDECTOMY     CARPAL TUNNEL RELEASE Bilateral    CHOLECYSTECTOMY  1991   GASTRIC SLEEVE  2018   KNEE ARTHROSCOPY Bilateral    TONSILLECTOMY      FAMILY HISTORY: Family History  Adopted: Yes  Problem Relation Age of Onset   Alcoholism Father     SOCIAL HISTORY: Social History   Socioeconomic History   Marital status: Single    Spouse name: Not on file   Number of children: 5   Years of education: Not on file   Highest education level: Some college, no degree  Occupational History   Not on file  Social Needs   Financial resource strain: Not on file   Food insecurity    Worry: Not on file    Inability: Not on file   Transportation needs    Medical: Not on file    Non-medical: Not on file  Tobacco Use   Smoking status: Former Smoker    Packs/day: 0.50    Years: 40.00    Pack years: 20.00    Types: Cigarettes    Quit date: 2018    Years since quitting: 2.6   Smokeless tobacco: Never Used  Substance and Sexual Activity   Alcohol use: No    Alcohol/week: 0.0 standard drinks    Comment: quit 2018   Drug use: No   Sexual activity: Yes  Lifestyle   Physical activity    Days per week: Not  on file    Minutes per session: Not on file   Stress: Not on file  Relationships   Social connections    Talks on phone: Not on file    Gets together: Not on file    Attends religious service: Not on file    Active member of club or organization: Not on file    Attends meetings of clubs or organizations: Not on file    Relationship status: Not on file   Intimate partner  violence    Fear of current or ex partner: Not on file    Emotionally abused: Not on file    Physically abused: Not on file    Forced sexual activity: Not on file  Other Topics Concern   Not on file  Social History Narrative   Lives at home alone   Caffeine a few times weekly      PHYSICAL EXAM  Vitals:   03/03/19 0750  BP: 115/83  Pulse: 68  Temp: (!) 97.3 F (36.3 C)  Weight: 239 lb 9.6 oz (108.7 kg)  Height: 5\' 2"  (1.575 m)   Body mass index is 43.82 kg/m.  Generalized: Well developed, in no acute distress  Cardiology: normal rate and rhythm, no murmur noted Respiratory: clear to auscultation bilaterally Neck circ 16.5" Neurological examination  Mentation: Alert oriented to time, place, history taking. Follows all commands speech and language fluent Cranial nerve II-XII: Pupils were equal round reactive to light. Extraocular movements were full Motor: The motor testing reveals 4 over 5 strength of all 4 extremities. Good symmetric motor tone is noted throughout.  Gait and station: Gait is normal, uses walker  DIAGNOSTIC DATA (LABS, IMAGING, TESTING) - I reviewed patient records, labs, notes, testing and imaging myself where available.  No flowsheet data found.   Lab Results  Component Value Date   WBC 8.1 07/24/2016   HGB 12.8 07/24/2016   HCT 38.7 07/24/2016   MCV 86.3 07/24/2016   PLT 293.0 07/24/2016      Component Value Date/Time   NA 137 07/24/2016 1006   K 4.3 07/24/2016 1006   CL 103 07/24/2016 1006   CO2 27 07/24/2016 1006   GLUCOSE 113 (H) 07/24/2016 1006   BUN 21 07/24/2016 1006   CREATININE 1.10 07/24/2016 1006   CALCIUM 9.4 07/24/2016 1006   PROT 7.4 07/24/2016 1006   ALBUMIN 4.0 07/24/2016 1006   AST 14 07/24/2016 1006   ALT 13 07/24/2016 1006   ALKPHOS 89 07/24/2016 1006   BILITOT 0.3 07/24/2016 1006   GFRNONAA 84.46 02/22/2010 0900   GFRAA 98 06/01/2008 0853   Lab Results  Component Value Date   CHOL 155 03/08/2016   HDL  53.30 03/08/2016   LDLCALC 68 03/08/2016   LDLDIRECT 105.9 10/29/2012   TRIG 169.0 (H) 03/08/2016   CHOLHDL 3 03/08/2016   Lab Results  Component Value Date   HGBA1C 6.0 03/08/2016   Lab Results  Component Value Date   VITAMINB12 704 07/24/2016   Lab Results  Component Value Date   TSH 2.84 07/24/2016     ASSESSMENT AND PLAN 63 y.o. year old female  has a past medical history of ALLERGIC RHINITIS, Anemia, Anxiety, ARTHRITIS, ASTHMA, B12 DEFICIENCY, CLUSTER HEADACHE, Colon polyp (07/14/2014), COPD, COPD (chronic obstructive pulmonary disease) (HCC), Depressive disorder, not elsewhere classified, Gallstones, GERD, Hypercholesterolemia, HYPERLIPIDEMIA, Hypertension, HYPOKALEMIA, MILD, INSOMNIA, Iron deficiency, Morbid obesity (HCC), Non-cardiac chest pain, and TOBACCO USE. here with     ICD-10-CM   1. Chronic migraine  without aura without status migrainosus, not intractable  G43.709   2. OSA (obstructive sleep apnea)  G47.33 For home use only DME continuous positive airway pressure (CPAP)    Ms. Durene CalHunter reports that she is not use CPAP therapy since March due to needing a different mask.  She was advised that in the future to always reach out with any concerns if she is unable to use therapy.  I have encouraged her to use CPAP nightly and for greater than 4 hours each night.  We will send an order to [facility] today for new supplies as well as mask refitting.  She will continue topiramate as prescribed.  She will also continue Emgality as prescribed by PCP.  We have discussed relation of untreated sleep apnea and continued headaches.  We have also discussed increased risk of cardiovascular disease, stroke and heart attack with untreated sleep apnea.  We will follow-up with her in 6 months.  She verbalizes understanding and agreement with this plan.   Orders Placed This Encounter  Procedures   For home use only DME continuous positive airway pressure (CPAP)    New supplies, needs mask  refitting    Order Specific Question:   Length of Need    Answer:   Lifetime    Order Specific Question:   Patient has OSA or probable OSA    Answer:   Yes    Order Specific Question:   Is the patient currently using CPAP in the home    Answer:   Yes    Order Specific Question:   Settings    Answer:   Other see comments    Order Specific Question:   CPAP supplies needed    Answer:   Mask, headgear, cushions, filters, heated tubing and water chamber     No orders of the defined types were placed in this encounter.     I spent 15 minutes with the patient. 50% of this time was spent counseling and educating patient on plan of care and medications.    Shawnie Dappermy Leaira Fullam, FNP-C 03/03/2019, 8:18 AM [facility] [address], [address] [place], [place] [address] [phone]

## 2019-03-03 NOTE — Patient Instructions (Signed)
Restart CPAP nightly and for greater than 4 hours each night.   Continue topiramate and Emgality as prescribed  Follow up in 6 months  Sleep Apnea Sleep apnea affects breathing during sleep. It causes breathing to stop for a short time or to become shallow. It can also increase the risk of:  Heart attack.  Stroke.  Being very overweight (obese).  Diabetes.  Heart failure.  Irregular heartbeat. The goal of treatment is to help you breathe normally again. What are the causes? There are three kinds of sleep apnea:  Obstructive sleep apnea. This is caused by a blocked or collapsed airway.  Central sleep apnea. This happens when the brain does not send the right signals to the muscles that control breathing.  Mixed sleep apnea. This is a combination of obstructive and central sleep apnea. The most common cause of this condition is a collapsed or blocked airway. This can happen if:  Your throat muscles are too relaxed.  Your tongue and tonsils are too large.  You are overweight.  Your airway is too small. What increases the risk?  Being overweight.  Smoking.  Having a small airway.  Being older.  Being female.  Drinking alcohol.  Taking medicines to calm yourself (sedatives or tranquilizers).  Having family members with the condition. What are the signs or symptoms?  Trouble staying asleep.  Being sleepy or tired during the day.  Getting angry a lot.  Loud snoring.  Headaches in the morning.  Not being able to focus your mind (concentrate).  Forgetting things.  Less interest in sex.  Mood swings.  Personality changes.  Feelings of sadness (depression).  Waking up a lot during the night to pee (urinate).  Dry mouth.  Sore throat. How is this diagnosed?  Your medical history.  A physical exam.  A test that is done when you are sleeping (sleep study). The test is most often done in a sleep lab but may also be done at home. How is this  treated?   Sleeping on your side.  Using a medicine to get rid of mucus in your nose (decongestant).  Avoiding the use of alcohol, medicines to help you relax, or certain pain medicines (narcotics).  Losing weight, if needed.  Changing your diet.  Not smoking.  Using a machine to open your airway while you sleep, such as: ? An oral appliance. This is a mouthpiece that shifts your lower jaw forward. ? A CPAP device. This device blows air through a mask when you breathe out (exhale). ? An EPAP device. This has valves that you put in each nostril. ? A BPAP device. This device blows air through a mask when you breathe in (inhale) and breathe out.  Having surgery if other treatments do not work. It is important to get treatment for sleep apnea. Without treatment, it can lead to:  High blood pressure.  Coronary artery disease.  In men, not being able to have an erection (impotence).  Reduced thinking ability. Follow these instructions at home: Lifestyle  Make changes that your doctor recommends.  Eat a healthy diet.  Lose weight if needed.  Avoid alcohol, medicines to help you relax, and some pain medicines.  Do not use any products that contain nicotine or tobacco, such as cigarettes, e-cigarettes, and chewing tobacco. If you need help quitting, ask your doctor. General instructions  Take over-the-counter and prescription medicines only as told by your doctor.  If you were given a machine to use while you  sleep, use it only as told by your doctor.  If you are having surgery, make sure to tell your doctor you have sleep apnea. You may need to bring your device with you.  Keep all follow-up visits as told by your doctor. This is important. Contact a doctor if:  The machine that you were given to use during sleep bothers you or does not seem to be working.  You do not get better.  You get worse. Get help right away if:  Your chest hurts.  You have trouble  breathing in enough air.  You have an uncomfortable feeling in your back, arms, or stomach.  You have trouble talking.  One side of your body feels weak.  A part of your face is hanging down. These symptoms may be an emergency. Do not wait to see if the symptoms will go away. Get medical help right away. Call your local emergency services (911 in the [place] Do not drive yourself to the hospital. Summary  This condition affects breathing during sleep.  The most common cause is a collapsed or blocked airway.  The goal of treatment is to help you breathe normally while you sleep. This information is not intended to replace advice given to you by your health care provider. Make sure you discuss any questions you have with your health care provider. Document Released: 04/04/2008 Document Revised: 04/12/2018 Document Reviewed: 02/19/2018 Elsevier Patient Education  2020 [employer]

## 2019-03-25 ENCOUNTER — Other Ambulatory Visit: Payer: Medicare Other

## 2019-04-10 ENCOUNTER — Ambulatory Visit
Admission: RE | Admit: 2019-04-10 | Discharge: 2019-04-10 | Disposition: A | Discharge status: Home / Self Care | Payer: Medicare Other | Source: Ambulatory Visit | Attending: Pain Medicine | Admitting: Pain Medicine

## 2019-04-10 ENCOUNTER — Other Ambulatory Visit: Payer: Self-pay

## 2019-04-10 DIAGNOSIS — M545 Low back pain, unspecified: Secondary | ICD-10-CM

## 2019-04-10 IMAGING — MR MR LUMBAR SPINE W/O CM
4 of 5 series · 18 of 48 positions shown · non-contrast
Comparison: Plain films lumbar spine [DATE].

CLINICAL DATA: Chronic low back pain.  No known injury.

EXAM:
MRI LUMBAR SPINE WITHOUT CONTRAST
TECHNIQUE: Multiplanar, multisequence MR imaging of the lumbar spine was
performed. No intravenous contrast was administered.

[Series 6: T2 · sagittal · 4.0mm · 0.73mm/px · 6 of 15 slices shown (1 of 2)]
[im 1/15]
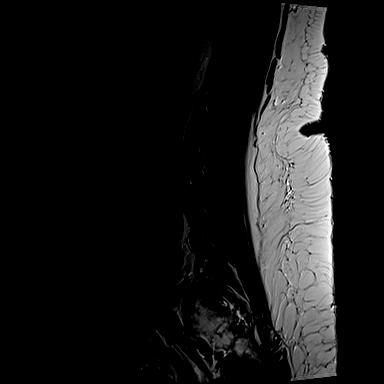
[im 3/15]
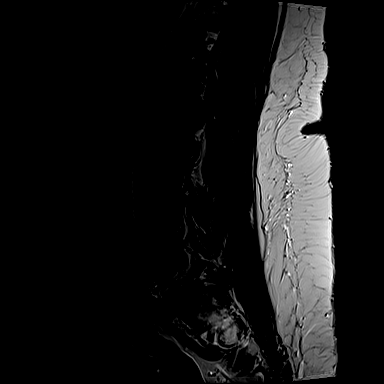
[im 6/15]
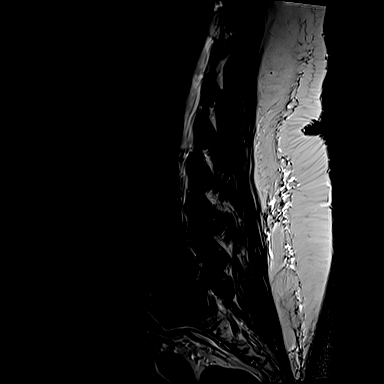
[im 9/15]
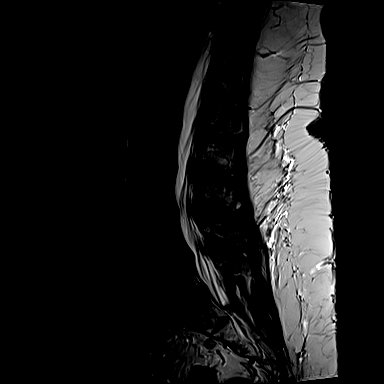
[im 12/15]
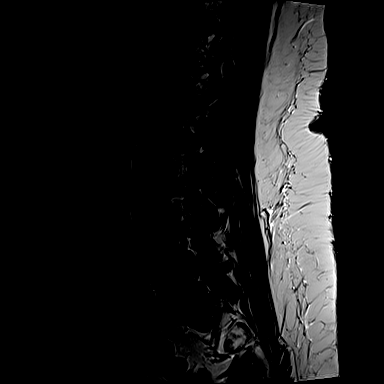
[im 15/15]
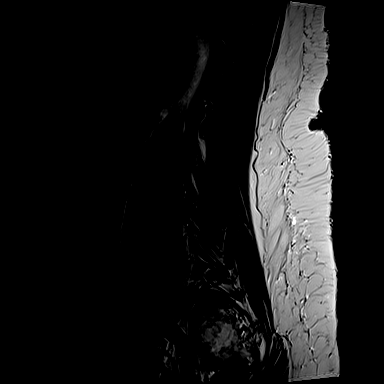

[Series 7: T1 · sagittal · 4.0mm · 0.73mm/px · 3 of 15 slices shown (1 of 2)]
[im 3/15]
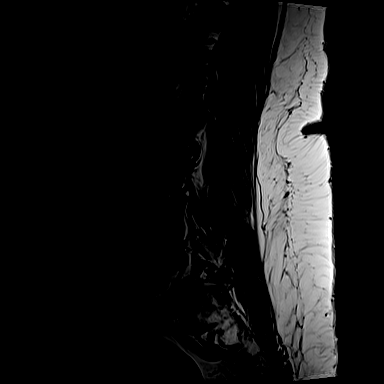
[im 9/15]
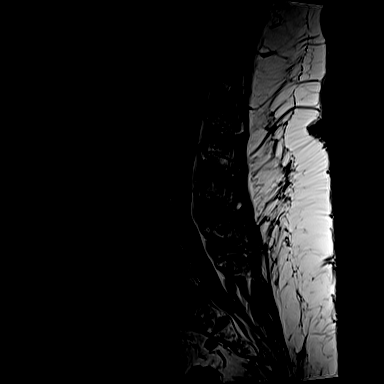
[im 15/15]
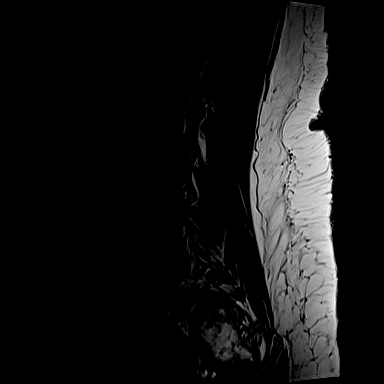

[Series 13: T2 · axial · 4.0mm · 0.28mm/px · z∈[-2,+177]mm · 6 of 40 slices shown (2 of 2)]
[im 1/40]
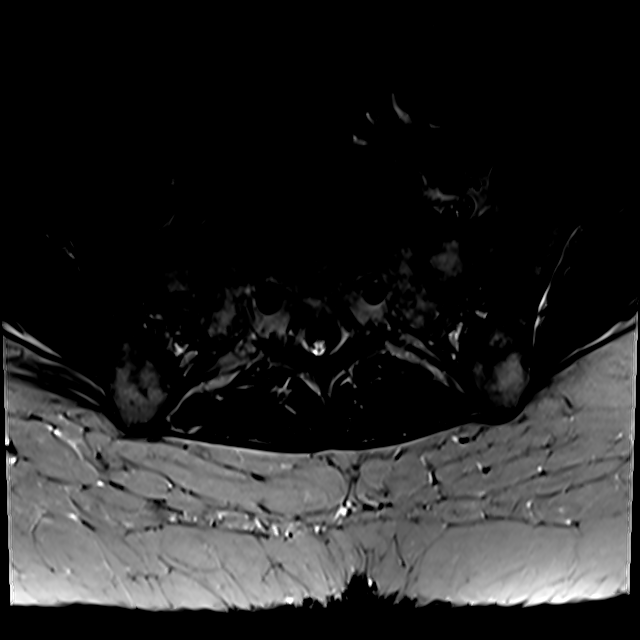
[im 6/40]
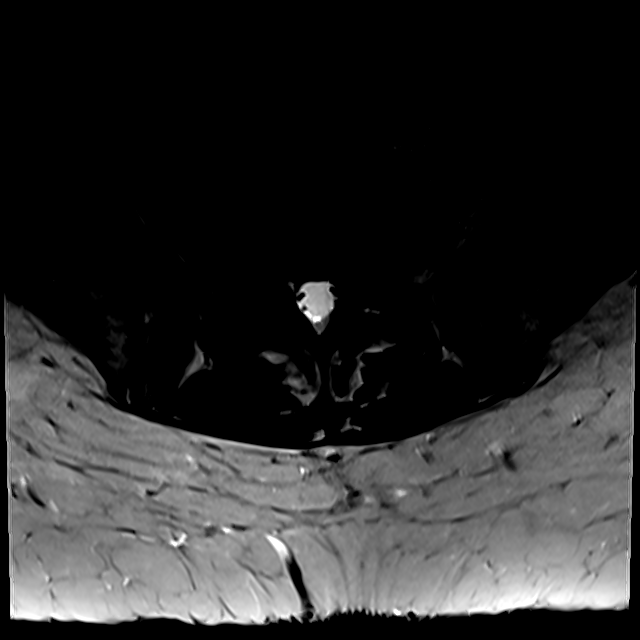
[im 12/40]
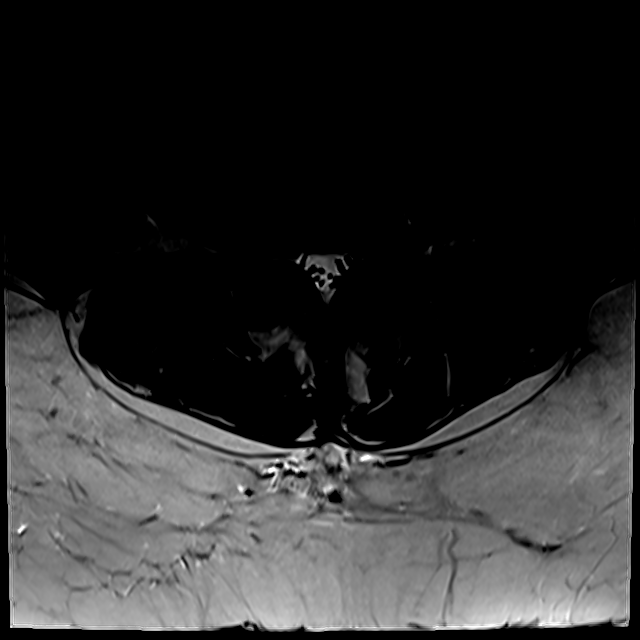
[im 17/40]
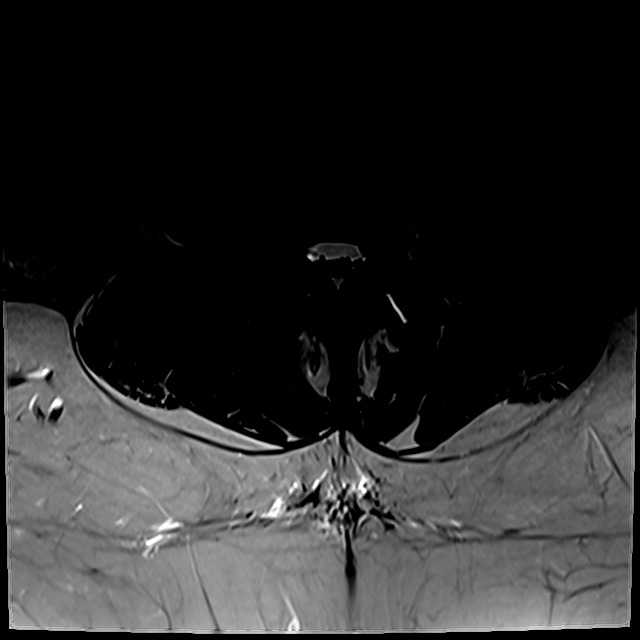
[im 20/40]
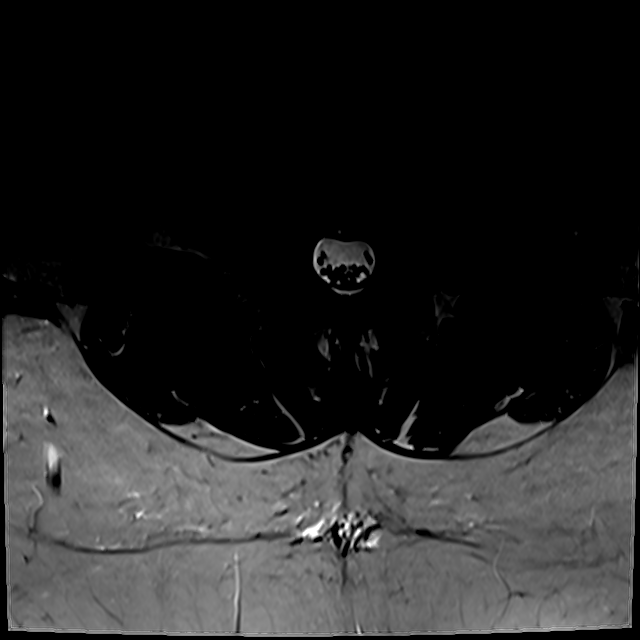
[im 34/40]
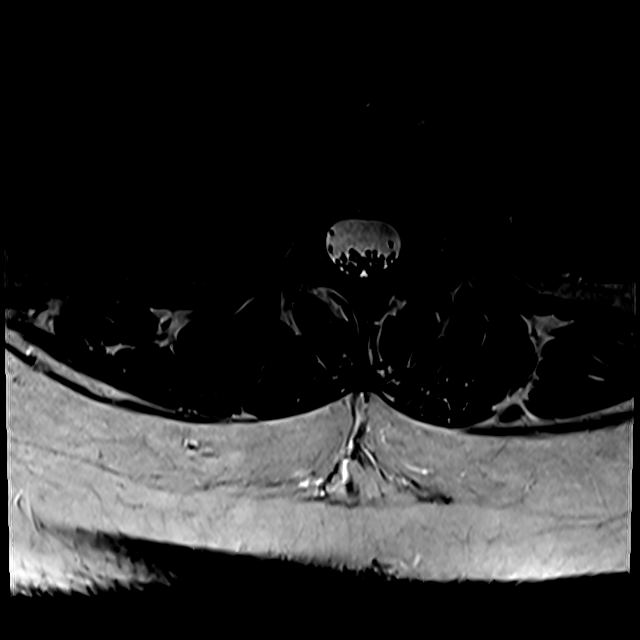

[Series 100: T1 · axial · 4.0mm · 0.28mm/px · z∈[+22,+177]mm · 3 of 40 slices shown (2 of 2)]
[im 6/40]
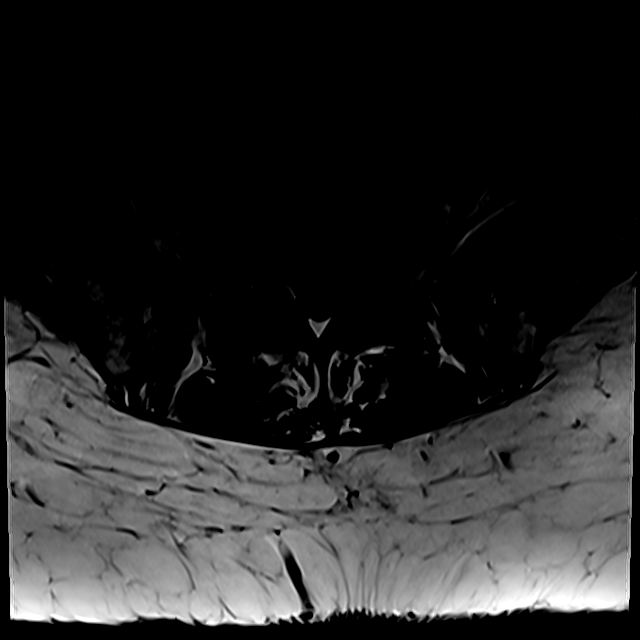
[im 20/40]
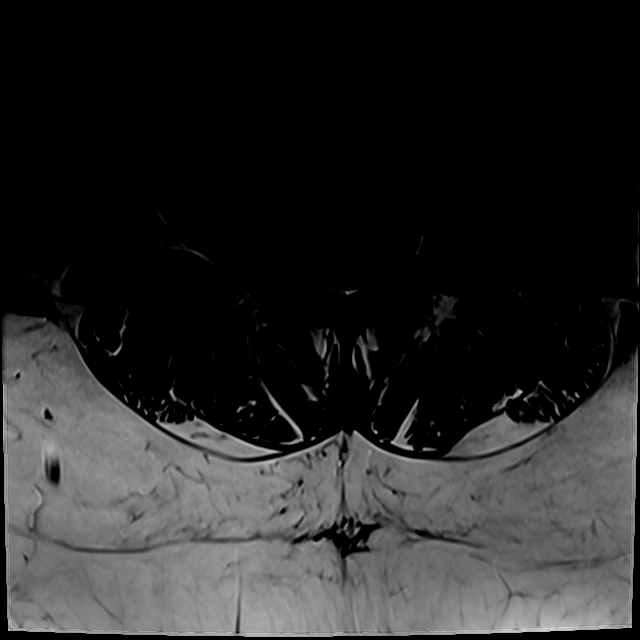
[im 34/40]
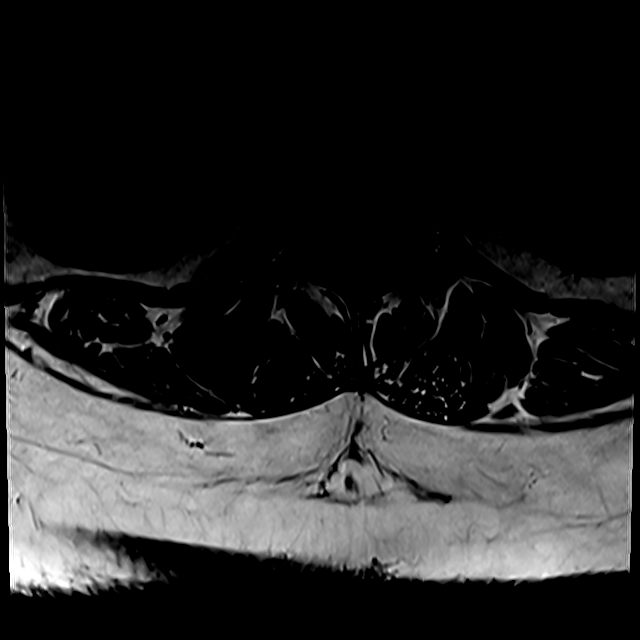

[18 of 48 positions shown; findings below may reference images not displayed]

FINDINGS: Segmentation:  Standard.

Alignment:  Maintained.

Vertebrae: No fracture, evidence of discitis, or bone lesion. There
is some degenerative endplate signal change anteriorly at L4-5 and
L5-S1.

Conus medullaris and cauda equina: Conus extends to the L1 level.
Conus and cauda equina appear normal.

Paraspinal and other soft tissues: Negative.

Disc levels:

T10-11 and T11-12 are imaged in the sagittal plane only and
negative.

T12-L1: Negative.

L1-2: Negative.

L2-3: Negative.

L3-4: There is left worse than right facet degenerative disease with
marrow edema in the left facets and pedicles consistent with
secondary stress change. Ligamentum flavum thickening and a minimal
disc bulge are seen. No stenosis.

L4-5: Disc bulge, ligamentum flavum thickening and moderate to
moderately severe facet degenerative change, worse on the right.
There is mild central canal narrowing. The foramina are open.

L5-S1: Facet degenerative disease, ligamentum flavum thickening and
a disc bulge more prominent to the left. The central canal is open.
Moderate bilateral foraminal narrowing is worse on the left.
IMPRESSION: Lower lumbar facet arthropathy most notable on the left at L3-4
where there is marrow edema in the facets and pedicles.

Mild central canal narrowing at L4-5.

Moderate bilateral foraminal narrowing at L5-S1 is worse on the
left.

## 2019-05-12 ENCOUNTER — Other Ambulatory Visit: Payer: Self-pay | Admitting: *Deleted

## 2019-05-12 MED ORDER — TOPIRAMATE 50 MG PO TABS: 50.0000 mg | ORAL_TABLET | Freq: Every day | ORAL | 1 refills | Status: DC

## 2019-09-01 ENCOUNTER — Telehealth: Payer: Self-pay

## 2019-09-01 MED ORDER — TOPIRAMATE 50 MG PO TABS: 50.0000 mg | ORAL_TABLET | Freq: Every day | ORAL | 0 refills | Status: AC

## 2019-09-01 NOTE — Telephone Encounter (Signed)
New script has been sent to the pharmacy.  

## 2020-04-08 ENCOUNTER — Other Ambulatory Visit: Payer: Self-pay | Admitting: Pain Medicine

## 2020-04-09 ENCOUNTER — Other Ambulatory Visit: Payer: Self-pay | Admitting: Pain Medicine

## 2020-04-12 ENCOUNTER — Other Ambulatory Visit: Payer: Self-pay | Admitting: Pain Medicine

## 2020-04-12 DIAGNOSIS — M542 Cervicalgia: Secondary | ICD-10-CM

## 2020-06-30 ENCOUNTER — Ambulatory Visit
Admission: RE | Admit: 2020-06-30 | Discharge: 2020-06-30 | Disposition: A | Discharge status: Home / Self Care | Payer: Medicare Other | Source: Ambulatory Visit | Attending: Pain Medicine | Admitting: Pain Medicine

## 2020-06-30 DIAGNOSIS — M542 Cervicalgia: Secondary | ICD-10-CM

## 2020-06-30 IMAGING — MR MR CERVICAL SPINE W/O CM
5 of 6 series · 30 of 48 positions shown · non-contrast
Comparison: X-ray [DATE]

CLINICAL DATA: Left-sided neck pain 1 year

EXAM:
MRI CERVICAL SPINE WITHOUT CONTRAST
TECHNIQUE: Multiplanar, multisequence MR imaging of the cervical spine was
performed. No intravenous contrast was administered.

[Series 5: T2 · sagittal · 3.0mm · 0.55mm/px · 4 of 8 slices shown (1 of 3)]
[im 1/8]
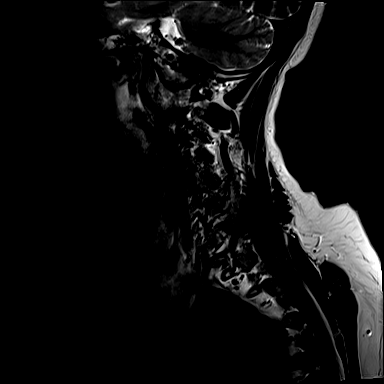
[im 3/8]
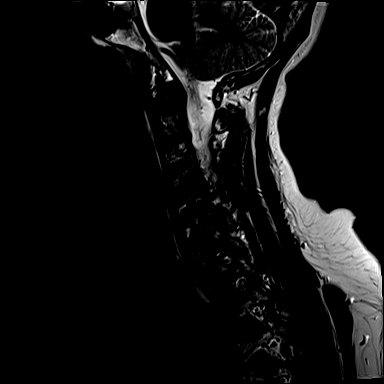
[im 5/8]
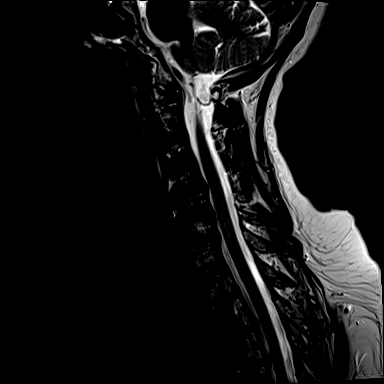
[im 8/8]
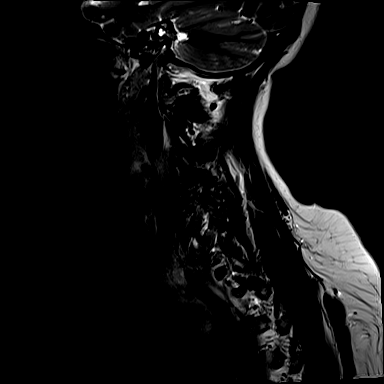

[Series 6: T1 · sagittal · 3.0mm · 0.66mm/px · 6 of 15 slices shown]
[im 1/15]
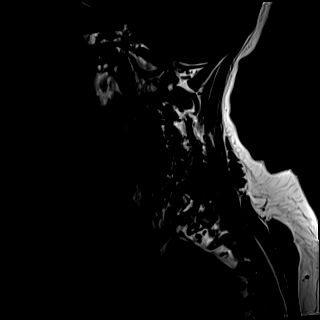
[im 3/15]
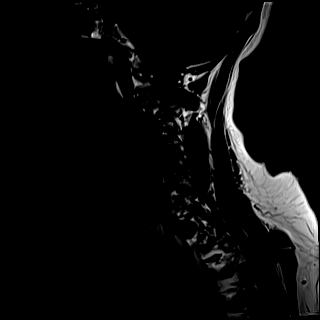
[im 6/15]
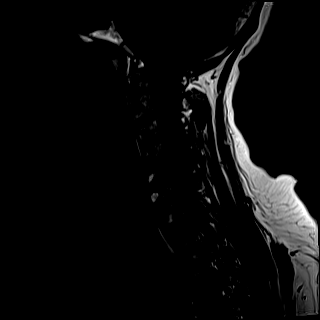
[im 9/15]
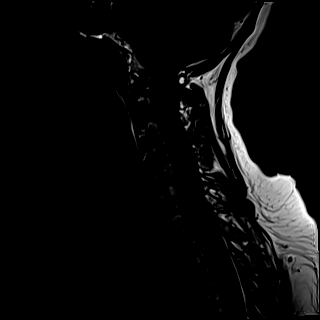
[im 12/15]
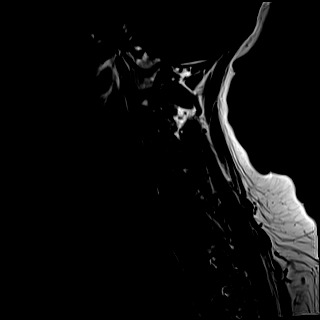
[im 15/15]
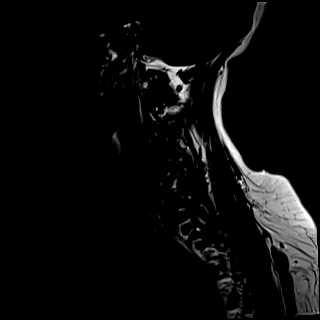

[Series 7: T2 · sagittal · 3.0mm · 0.66mm/px · 6 of 15 slices shown (2 of 3)]
[im 1/15]
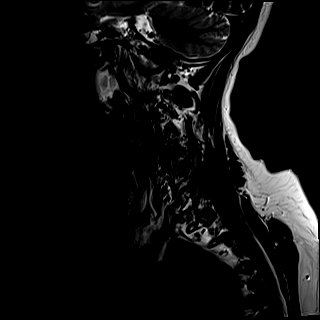
[im 3/15]
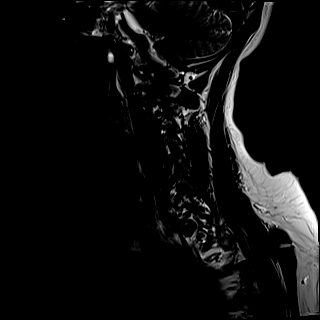
[im 6/15]
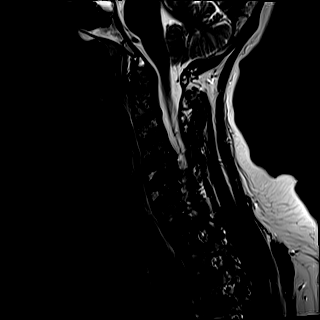
[im 9/15]
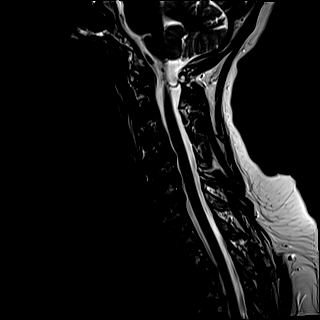
[im 12/15]
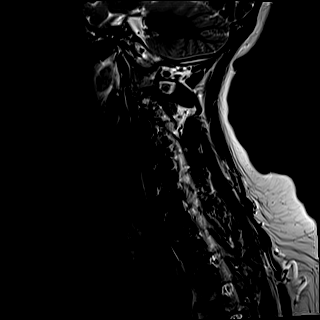
[im 15/15]
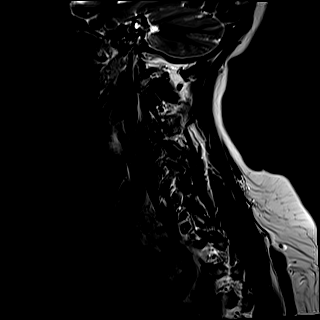

[Series 8: STIR · sagittal · 3.0mm · 0.33mm/px · 5 of 15 slices shown]
[im 1/15]
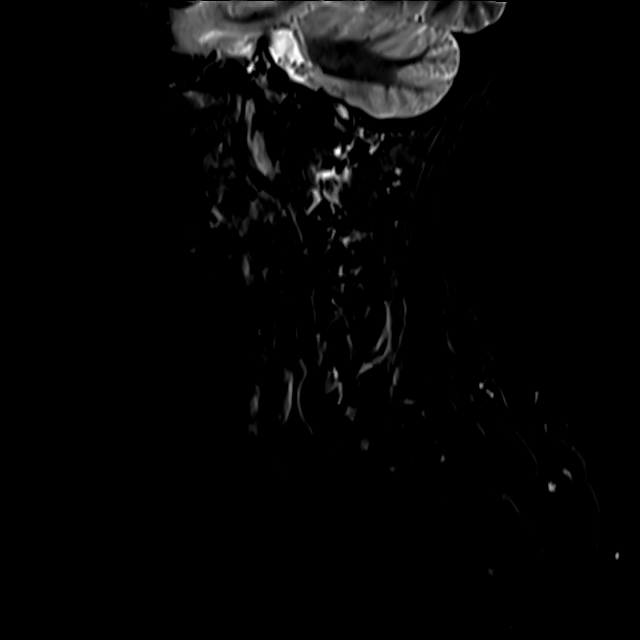
[im 3/15]
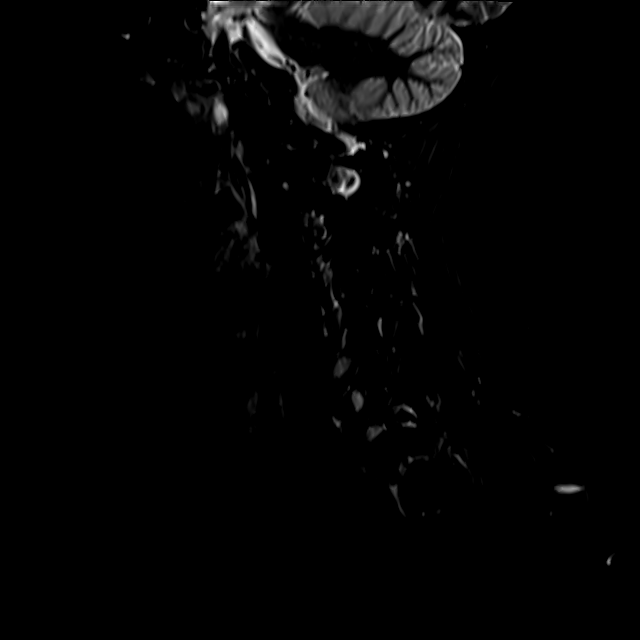
[im 6/15]
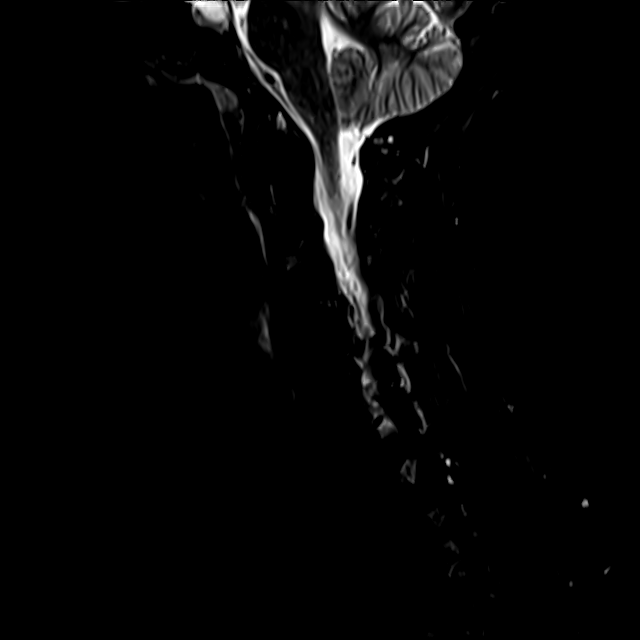
[im 9/15]
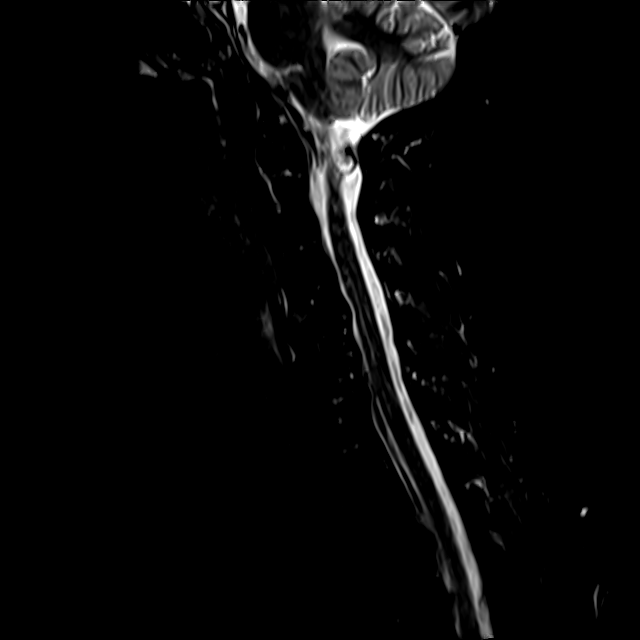
[im 12/15]
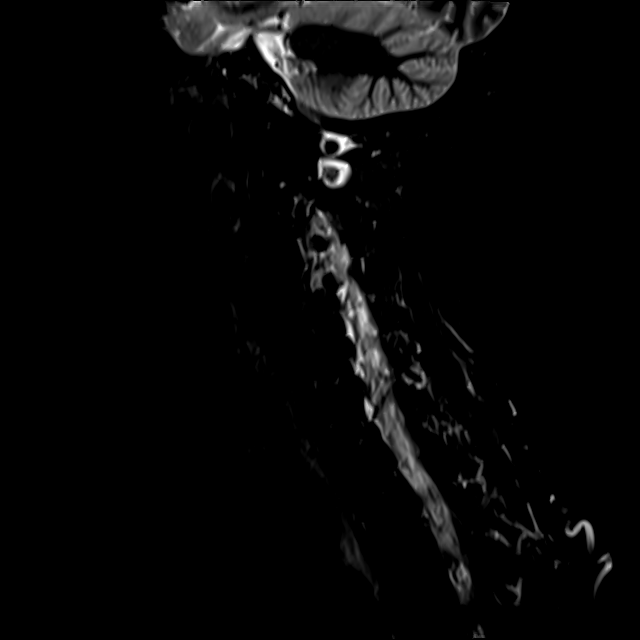

[Series 9: T2 · axial · 3.0mm · 0.50mm/px · z∈[-66,+27]mm · 9 of 30 slices shown (3 of 3)]
[im 1/30]
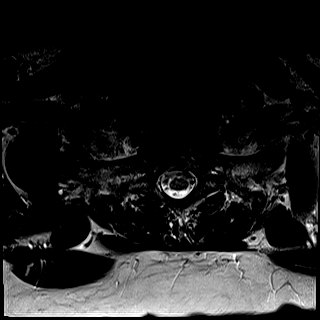
[im 5/30]
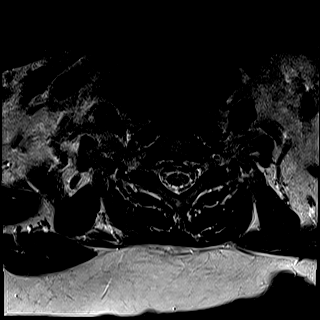
[im 10/30]
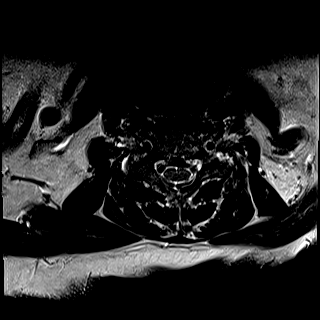
[im 13/30]
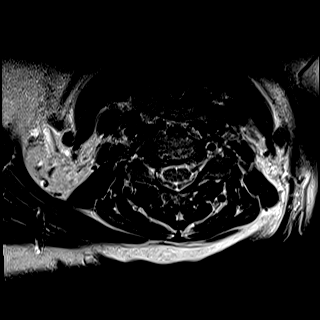
[im 15/30]
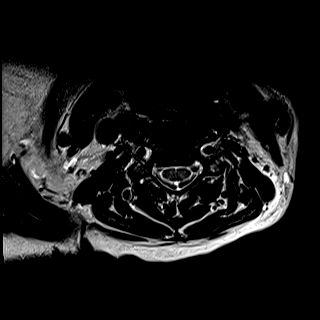
[im 17/30]
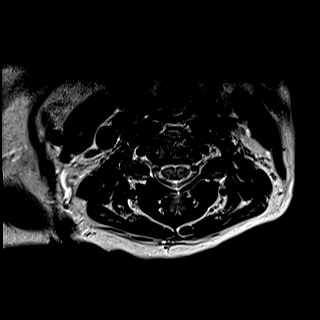
[im 20/30]
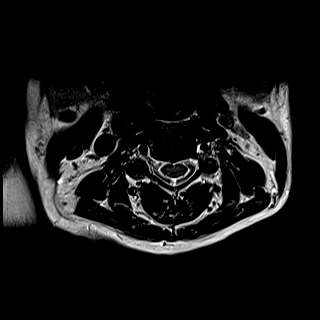
[im 25/30]
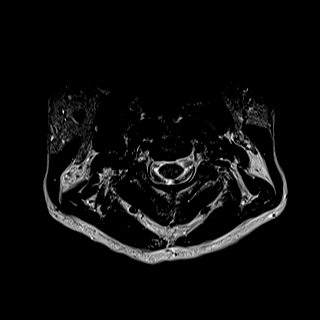
[im 30/30]
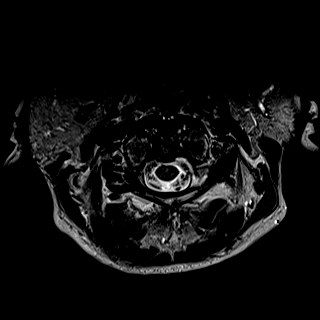

[30 of 48 positions shown; findings below may reference images not displayed]

FINDINGS: Alignment: Straightening of the cervical lordosis. 2 mm grade 1
anterolisthesis C3 on C4.

Vertebrae: No fracture, evidence of discitis, or bone lesion. Mild
discogenic endplate marrow changes at C4-5. Anterior endplate
spurring most pronounced at C4-5 and C5-6.

Cord: Normal signal and morphology.

Posterior Fossa, vertebral arteries, paraspinal tissues: Negative.

Disc levels:

C2-C3: Minimal right paracentral disc osteophyte complex. No
foraminal or canal stenosis.

C3-C4: No significant disc protrusion, foraminal stenosis, or canal
stenosis.

C4-C5: Mild posterior disc osteophyte complex and right greater than
left uncovertebral spurring. No foraminal or canal stenosis.

C5-C6: Mild posterior disc osteophyte complex with possible right
foraminal protrusion. Prominent right-sided uncovertebral spurring
resulting in mild-to-moderate right-sided foraminal stenosis. Mild
left foraminal stenosis. No canal stenosis.

C6-C7: Disc osteophyte complex with left paracentral protrusion. No
significant foraminal or canal stenosis.

C7-T1: No significant disc protrusion, foraminal stenosis, or canal
stenosis.
IMPRESSION: 1. Mild multilevel cervical spondylosis, most pronounced at the C5-6
level where there is mild-to-moderate right-sided foraminal stenosis
and mild left-sided foraminal stenosis.
2. No significant canal stenosis at any level.

## 2021-07-18 ENCOUNTER — Encounter: Payer: Self-pay | Admitting: Gastroenterology
